# Patient Record
Sex: Female | Born: 1935 | Race: White | Hispanic: No | Marital: Married | Smoking: Never smoker
Health system: Southern US, Community
[De-identification: ages and names within clinical notes are randomized; demographics above are authoritative.]

## PROBLEM LIST (undated history)

## (undated) DIAGNOSIS — T4145XA Adverse effect of unspecified anesthetic, initial encounter: Secondary | ICD-10-CM

## (undated) DIAGNOSIS — M810 Age-related osteoporosis without current pathological fracture: Secondary | ICD-10-CM

## (undated) DIAGNOSIS — H409 Unspecified glaucoma: Secondary | ICD-10-CM

## (undated) DIAGNOSIS — M1990 Unspecified osteoarthritis, unspecified site: Secondary | ICD-10-CM

## (undated) DIAGNOSIS — R350 Frequency of micturition: Secondary | ICD-10-CM

## (undated) DIAGNOSIS — T8859XA Other complications of anesthesia, initial encounter: Secondary | ICD-10-CM

## (undated) DIAGNOSIS — Z950 Presence of cardiac pacemaker: Secondary | ICD-10-CM

## (undated) HISTORY — PX: EYE SURGERY: SHX253

## (undated) HISTORY — PX: WRIST FRACTURE SURGERY: SHX121

## (undated) HISTORY — PX: BACK SURGERY: SHX140

---

## 1979-01-03 HISTORY — PX: CHOLECYSTECTOMY: SHX55

## 2002-02-09 ENCOUNTER — Encounter: Payer: Self-pay | Admitting: Family Medicine

## 2002-02-09 ENCOUNTER — Ambulatory Visit (HOSPITAL_COMMUNITY): Admission: RE | Admit: 2002-02-09 | Discharge: 2002-02-09 | Payer: Self-pay | Admitting: Family Medicine

## 2003-08-03 ENCOUNTER — Ambulatory Visit (HOSPITAL_COMMUNITY): Admission: RE | Admit: 2003-08-03 | Discharge: 2003-08-03 | Payer: Self-pay | Admitting: Family Medicine

## 2004-01-18 ENCOUNTER — Ambulatory Visit (HOSPITAL_COMMUNITY): Admission: RE | Admit: 2004-01-18 | Discharge: 2004-01-18 | Payer: Self-pay | Admitting: Family Medicine

## 2004-03-11 ENCOUNTER — Ambulatory Visit (HOSPITAL_COMMUNITY): Admission: RE | Admit: 2004-03-11 | Discharge: 2004-03-11 | Payer: Self-pay | Admitting: Family Medicine

## 2004-07-15 ENCOUNTER — Ambulatory Visit (HOSPITAL_COMMUNITY): Admission: RE | Admit: 2004-07-15 | Discharge: 2004-07-15 | Payer: Self-pay | Admitting: Urology

## 2004-07-29 ENCOUNTER — Ambulatory Visit: Payer: Self-pay | Admitting: Internal Medicine

## 2004-07-29 ENCOUNTER — Ambulatory Visit (HOSPITAL_COMMUNITY): Admission: RE | Admit: 2004-07-29 | Discharge: 2004-07-29 | Payer: Self-pay | Admitting: Internal Medicine

## 2004-08-22 ENCOUNTER — Ambulatory Visit (HOSPITAL_COMMUNITY): Admission: RE | Admit: 2004-08-22 | Discharge: 2004-08-22 | Payer: Self-pay | Admitting: Urology

## 2005-07-13 ENCOUNTER — Ambulatory Visit (HOSPITAL_COMMUNITY): Admission: RE | Admit: 2005-07-13 | Discharge: 2005-07-13 | Payer: Self-pay | Admitting: Family Medicine

## 2006-02-12 ENCOUNTER — Ambulatory Visit (HOSPITAL_COMMUNITY): Admission: RE | Admit: 2006-02-12 | Discharge: 2006-02-12 | Payer: Self-pay | Admitting: Family Medicine

## 2009-03-14 ENCOUNTER — Ambulatory Visit: Payer: Self-pay | Admitting: Orthopedic Surgery

## 2009-03-14 DIAGNOSIS — M25559 Pain in unspecified hip: Secondary | ICD-10-CM | POA: Insufficient documentation

## 2009-03-14 DIAGNOSIS — M4800 Spinal stenosis, site unspecified: Secondary | ICD-10-CM | POA: Insufficient documentation

## 2009-03-14 DIAGNOSIS — M4120 Other idiopathic scoliosis, site unspecified: Secondary | ICD-10-CM | POA: Insufficient documentation

## 2009-03-15 ENCOUNTER — Telehealth: Payer: Self-pay | Admitting: Orthopedic Surgery

## 2009-03-19 ENCOUNTER — Ambulatory Visit (HOSPITAL_COMMUNITY): Admission: RE | Admit: 2009-03-19 | Discharge: 2009-03-19 | Payer: Self-pay | Admitting: Orthopedic Surgery

## 2009-03-19 ENCOUNTER — Encounter: Payer: Self-pay | Admitting: Orthopedic Surgery

## 2009-03-20 ENCOUNTER — Encounter: Payer: Self-pay | Admitting: Orthopedic Surgery

## 2009-05-08 ENCOUNTER — Telehealth: Payer: Self-pay | Admitting: Orthopedic Surgery

## 2009-05-23 ENCOUNTER — Telehealth: Payer: Self-pay | Admitting: Orthopedic Surgery

## 2009-06-17 ENCOUNTER — Encounter: Admission: RE | Admit: 2009-06-17 | Discharge: 2009-06-17 | Payer: Self-pay | Admitting: Neurology

## 2009-06-17 ENCOUNTER — Encounter: Payer: Self-pay | Admitting: Orthopedic Surgery

## 2009-08-28 ENCOUNTER — Encounter: Payer: Self-pay | Admitting: Orthopedic Surgery

## 2009-09-16 ENCOUNTER — Ambulatory Visit: Payer: Self-pay | Admitting: Orthopedic Surgery

## 2009-09-16 ENCOUNTER — Encounter (INDEPENDENT_AMBULATORY_CARE_PROVIDER_SITE_OTHER): Payer: Self-pay | Admitting: *Deleted

## 2009-09-16 DIAGNOSIS — M169 Osteoarthritis of hip, unspecified: Secondary | ICD-10-CM | POA: Insufficient documentation

## 2009-09-19 ENCOUNTER — Telehealth: Payer: Self-pay | Admitting: Orthopedic Surgery

## 2009-09-25 ENCOUNTER — Telehealth: Payer: Self-pay | Admitting: Orthopedic Surgery

## 2009-10-15 ENCOUNTER — Encounter: Payer: Self-pay | Admitting: Orthopedic Surgery

## 2009-10-28 ENCOUNTER — Telehealth: Payer: Self-pay | Admitting: Orthopedic Surgery

## 2009-11-19 ENCOUNTER — Ambulatory Visit (HOSPITAL_COMMUNITY): Admission: RE | Admit: 2009-11-19 | Discharge: 2009-11-19 | Payer: Self-pay | Admitting: Internal Medicine

## 2009-11-29 ENCOUNTER — Encounter (INDEPENDENT_AMBULATORY_CARE_PROVIDER_SITE_OTHER): Payer: Self-pay | Admitting: *Deleted

## 2009-11-29 DIAGNOSIS — R5383 Other fatigue: Secondary | ICD-10-CM

## 2009-11-29 DIAGNOSIS — R52 Pain, unspecified: Secondary | ICD-10-CM | POA: Insufficient documentation

## 2009-11-29 DIAGNOSIS — M6281 Muscle weakness (generalized): Secondary | ICD-10-CM | POA: Insufficient documentation

## 2009-11-29 DIAGNOSIS — R5381 Other malaise: Secondary | ICD-10-CM | POA: Insufficient documentation

## 2009-12-13 ENCOUNTER — Telehealth: Payer: Self-pay | Admitting: Orthopedic Surgery

## 2009-12-26 ENCOUNTER — Ambulatory Visit: Payer: Self-pay | Admitting: Infectious Diseases

## 2010-05-26 ENCOUNTER — Ambulatory Visit (HOSPITAL_COMMUNITY)
Admission: RE | Admit: 2010-05-26 | Discharge: 2010-05-26 | Payer: Self-pay | Source: Home / Self Care | Attending: Urology | Admitting: Urology

## 2010-06-03 NOTE — Letter (Signed)
Summary: *Referral Letter  [facility]  [address]  [place], [place] [address]   Phone: [phone]  Fax: [phone]    09/16/2009  Thank you in advance for agreeing to see my patient:  [name] [name] [address] [place], [place]  [address]  Phone: [phone]  Reason for Referral: spinal stenosis with positive grocery carts on  Procedures Requested: evaluation and treatment  Current Medical Problems: 1)  SCOLIOSIS, LUMBAR SPINE (ICD-737.30) 2)  SPINAL STENOSIS (ICD-724.00) 3)  HIP PAIN (ICD-719.45)   Past Medical History: 1)  Osteoporosis 2)  Glaucoma    Pertinent Labs: MRI November 2010, x-rays RIGHT hip May 2011   Thank you again for agreeing to see our patient; please contact us if you have any further questions or need additional information.  Sincerely,  Fuller Canada MD

## 2010-06-03 NOTE — Miscellaneous (Signed)
  called left message   Clinical Lists Changes

## 2010-06-03 NOTE — Miscellaneous (Signed)
Summary: Problems, Medications and Allergies updated  Clinical Lists Changes  Problems: Added new problem of FATIGUE (ICD-780.79) Added new problem of MUSCLE WEAKNESS (GENERALIZED) (ICD-728.87) Added new problem of GENERALIZED PAIN (ICD-780.96) Medications: Added new medication of CALCIUM-VITAMIN D 600-200 MG-UNIT TABS (CALCIUM-VITAMIN D) as directed per PCP Added new medication of * ACTONEL TABS (RISEDRONATE SODIUM) as directed per PCP Added new medication of VITAMIN D3 10000 UNIT CAPS (CHOLECALCIFEROL) Take 5 capsules by mouth one time a week per PCP Observations: Added new observation of ALLERGY REV: Done (11/29/2009 13:57)

## 2010-06-03 NOTE — Assessment & Plan Note (Signed)
Summary: RT HIP PAIN NEEDS XR/MEDICARE/[employer]/BSF   Visit Type:  Initial referral  Referring Provider:  Dr Margo Aye   CC:  right hip pain .  History of Present Illness: I saw [name] [name] in the office today for an initial visit.  She is a 77 years old woman with the complaint of:  HIP PAIN.  chief complaint: RIGHT HIP PAIN.  The patient is status post 2 back operations, one in 1966, and one in 1975. She's had episodic back pain over the years, but over the last 15 years or so. Her back pain has been very minimal. She was told to go home and take pain pills, which she did not do but over time. The back pain gradually went to light.  Over the last 2-3, months. She's had grade 4/10 pain, which is intermittent, improved with heat. She does have some pain, which radiates down into her RIGHT lower leg and also around the groin and lateral hip. She denies any injury. She is quite active still doing all of her housework and yard work.  x-rays were done in our office today, including hip and pelvis, and back.  Allergies (verified): 1)  ! Sulfa  Past History:  Past Medical History: Osteoporosis Glaucoma  Past Surgical History: Cataract surgery 2 Back surgeries Cholecystectomy  Review of Systems General:  Denies weight loss, weight gain, fever, chills, and fatigue. Cardiac :  Denies chest pain, angina, heart attack, heart failure, poor circulation, blood clots, and phlebitis. Resp:  Denies short of breath, difficulty breathing, COPD, cough, and pneumonia. GI:  Denies nausea, vomiting, diarrhea, constipation, difficulty swallowing, ulcers, GERD, and reflux. GU:  Denies kidney failure, kidney transplant, kidney stones, burning, poor stream, testicular cancer, blood in urine, and . Neuro:  Denies headache, dizziness, migraines, numbness, weakness, tremor, and unsteady walking. MS:  Complains of joint pain and osteoporosis; denies rheumatoid arthritis, joint swelling, gout, bone  cancer, and . Endo:  Denies thyroid disease, goiter, and diabetes. Psych:  Denies depression, mood swings, anxiety, panic attack, bipolar, and schizophrenia. Derm:  Denies eczema, cancer, and itching. EENT:  Complains of glaucoma; denies poor vision, cataracts, poor hearing, vertigo, ears ringing, sinusitis, hoarseness, toothaches, and bleeding gums. Immunology:  Denies seasonal allergies, sinus problems, and allergic to bee stings. Lymphatic:  Denies lymph node cancer and lymph edema.  Physical Exam  Additional Exam:  The patient is well developed and nourished, with normal grooming and hygiene. The body habitus is small  The pulses and perfusion were normal with normal color, temperature  and no swelling  Skin normal x 4 extremities  Lymph normal groin   Neuro: The coordination and sensation were normal In both legs The reflexes were normal RIGHT leg, abnormal LEFT knee previous nerve damage from back problem  Psychiatric alert and oriented x3. Mood and affect. Normal  Musculoskeletal Gait normal  Spine thoracic spine kyphosis. No tenderness. Lumbar spine. No tenderness. Thoracic and lumbar musculature. Normal. Muscle tone. No atrophy  Lower extremities. No tenderness over the greater trochanters, painful, internal rotation. The RIGHT hip. Normal LEFT hip normal flexion. Both hips, muscle tone normal. Both legs. Alignment, normal. Both legs. In the supine position. The leg lengths were equal although there is pelvic obliquity on the x-rays.   The upper extremities have normal appearance, ROM, strength and stability.      Impression & Recommendations:  Problem # 1:  SPINAL STENOSIS (ICD-724.00) Assessment New  Lumbar spine x-rays show degenerative scoliosis with disc space narrowing at  L3-L4, L4, 5, L5, S1, marginal osteophytes are seen. Coronal plane malalignment is quite severe. There is increased lordosis as well.  AP, pelvis and lateral, AP, RIGHT hip showed very  minimal degenerative changes with evidence of joint space maintained.  Assessment I think most of her pain is lumbar degenerated and advise her to get an MRI in preparation for epidural steroids if needed.  She probably has spinal stenosis and degenerative scoliosis with root impingement and foraminal stenosis.  Orders: New Patient Level IV ([address]) Lumbosacral Spine ,2/3 views (72100)  Problem # 2:  SCOLIOSIS, LUMBAR SPINE (ICD-737.30) Assessment: New  Orders: New Patient Level IV ([address]454) Lumbosacral Spine ,2/3 views (72100)  Problem # 3:  HIP PAIN (ICD-719.45) Assessment: New  Orders: New Patient Level IV ([address]11) Pelvis x-ray, 1/2 views (91478) Hip x-ray unilateral complete, minimum 2 views (29562)  Patient Instructions: 1)  MRI L spine [call results] 2)  tylenol, advil, or aleve are fine for pain

## 2010-06-03 NOTE — Assessment & Plan Note (Signed)
Summary: EVAL RT HIP/?NEW XRs?/REF J.LOVE/MEDICARE,LOY AM/CAF   Visit Type:  Follow-up Referring Provider:  [facility] Primary Provider:  Dr. Dwana Melena  CC:  right hip pain.  History of Present Illness: I saw [name] [name] in the office today for a followup visit.  She is a 77 years old woman with the complaint of:  right hip and back pain.  s/p Lumbar spine surgery x 2 last one was in the 1970s.  She had some difficulty in the 1990s was told she was inoperable she went home to take some medications but 3 days after a visit to the doctor at that time she woke up pain free and since that time has been very functional able patella garden plant flowers and do most anything she wanted to.  I saw her in the office for pain in her RIGHT hip in 2010 and saw no arthritis in the RIGHT hip a severely scoliotic degenerative disc and spine and knee lumbar region.  she did have an epidural at L5-S1 in February did not help.  Her MRI was done in November of 2010.  she has been worked up for line disease he was negative  The most interesting complaint she has is that when she goes in the grocery store and leans on the grocery cart she does fine when she's goes back to the car she started having problems again.  She complains of weakness when she walks.  I'm going to get another x-ray of her RIGHT hip just to make sure she doesn't have arthritis in the hip but she has a classic grocery cart sign or complaint for spinal stenosis  Allergies: 1)  ! Sulfa  Physical Exam  Additional Exam:  Normal Appearance, Oriented x 3, Mood normal  Cardiovascular exam reveals normal pulse perfusion to both legs and no swelling or edema.  there is no instability in either hip The leg lengths are equal.  She does have pain with flexion of the hip and pain with internal rotation in the RIGHT hip.  She has tenderness in the RIGHT buttock and RIGHT lower lumbar spine.  She is no pain in the  midline portion of the spine just to the RIGHT of the spine there is tenderness in the buttocks and lower back  The LEFT hip seems to be normal with normal flexion extension internal/external rotation.  Normal strength and LEFT leg skin is intact no tenderness over the greater trochanter.  There  She has  grade 0 reflexes in each leg normal muscle tone and grade 5 motor strength.  Skin is intact in the legs but no rash     Impression & Recommendations:  Problem # 1:  SPINAL STENOSIS (ICD-724.00) Assessment Deteriorated  Problem # 2:  SCOLIOSIS, LUMBAR SPINE (ICD-737.30) Assessment: Deteriorated  Problem # 3:  HIP PAIN (ICD-719.45) Assessment: Comment Only  AP and crosstable lateral RIGHT hip show slices as compared to previous film dated November 2000 and this shows that she has just a mild tender arthritis with a marginal osteophyte on the femoral head excellent joint space has been maintained impression is posterior arthritis mild RIGHT  I think it's okay to start her on debridement on 500 mg b.i.d. for her hip pain but she should see a spine specialist for spinal stenosis.  Her updated medication list for this problem includes:    Nabumetone 500 Mg Tabs (Nabumetone) .Marland Kitchen... 1 by mouth two times a day  Medications Added to Medication List  This Visit: 1)  Nabumetone 500 Mg Tabs (Nabumetone) .Marland Kitchen.. 1 by mouth two times a day  Other Orders: Est. Patient Level IV (84132) Hip x-ray unilateral complete, minimum 2 views (44010)  Patient Instructions: 1)  Injection right hip with steroids and lidocaine was not given today pt denied 2)  Spine referral for [facility] Nov 2010] 3)  f/u in 3 months  Prescriptions: NABUMETONE 500 MG TABS (NABUMETONE) 1 by mouth two times a day  #60 x 5   Entered and Authorized by:   Fuller Canada MD   Signed by:   Fuller Canada MD on 09/16/2009   Method used:   Faxed to ...       [facility] (retail)       [address]       [place], [place]  [address]       Ph: [phone]       Fax: [phone]   RxID:   [id number]   Appended Document: EVAL RT HIP/?NEW XRs?/REF J.LOVE/MEDICARE,LOY AM/CAF Allergies (verified): 1)  ! Sulfa  Past History:  Past Medical History: Osteoporosis Glaucoma  Past Surgical History: Cataract surgery 2 Back surgeries Cholecystectomy  Review of systems reveals that she has weakness in the legs she tires as she walks.  This is pretty consistent.  Again she has the decreased pain when she walks holding on to the grocery cart.  She does not have any bowel or bladder dysfunction at this time and no perineal sensory loss

## 2010-06-03 NOTE — Progress Notes (Signed)
Summary: Neurosurgeon referral.  Phone Note Outgoing Call   Call placed by: Waldon Reining,  Sep 19, 2009 3:44 PM Call placed to: Specialist Action Taken: Information Sent Summary of Call: I faxed a referral for this patient to [facility] to be seen for spinal stenosis of her l-spine.

## 2010-06-03 NOTE — Progress Notes (Signed)
Summary: Neurosurgeon appointment.  Phone Note Outgoing Call   Call placed by: Waldon Reining,  Sep 25, 2009 4:02 PM Call placed to: Patient Action Taken: Appt scheduled Summary of Call: I called to give the patient her appointment at [facility] with Dr. Newell Coral on 10-15-09 at 12:20. Patient is aware to take her MRI films.

## 2010-06-03 NOTE — Progress Notes (Signed)
Summary: Appointment with Dr. Sandria Manly.  Phone Note Outgoing Call   Call placed by: Waldon Reining,  May 23, 2009 4:21 PM Call placed to: Patient Action Taken: Phone Call Completed, Appt scheduled Summary of Call: I called to give the patient her appointment with Dr. Sandria Manly on 06-07-09 at 10:30.

## 2010-06-03 NOTE — Progress Notes (Signed)
Summary: call from patient asking for referral as discussed  Phone Note Call from Patient   Caller: Patient Summary of Call: Patient called with request:  - Asking about cancelling tomorrow's appointment for re-check hip/back.  States she spoke with Dr Romeo Apple and rec'd results of MRI; he mentioned referral to neurosurgeon.  Can she be referred w/out coming to this appointment?  - States she would like to see Dr. Sandria Manly * /  *Note: Dr Sandria Manly is a neurologist at [facility]. Their Ph is [phone]; Fax is [phone] Please advise. Initial call taken by: Cammie Sickle,  May 08, 2009 10:25 AM  Follow-up for Phone Call        proceed and cancel appointment Follow-up by: Fuller Canada MD,  May 08, 2009 10:28 AM  Additional Follow-up for Phone Call Additional follow up Details #1::        last note says I. will call with the results there is no appointment it should have been scheduled what is going on? Additional Follow-up by: Fuller Canada MD,  May 08, 2009 10:28 AM

## 2010-06-03 NOTE — Consult Note (Signed)
Summary: [facility] Consult Dr Leanne Lovely Consult Dr Newell Coral   Imported By: Cammie Sickle 10/26/2009 12:43:40  _____________________________________________________________________  External Attachment:    Type:   Image     Comment:   External Document

## 2010-06-03 NOTE — Progress Notes (Signed)
Summary: pt to call back to r/s appointment  Phone Note Call from Patient   Caller: Patient Summary of Call: Patient called to cancel her 55month fol/up appt; states other doctor appointments at this time. States will call to re-schedule. Initial call taken by: Cammie Sickle,  December 13, 2009 2:15 PM

## 2010-06-03 NOTE — Progress Notes (Signed)
Summary: Referral to Dr. Sandria Manly.  Phone Note Outgoing Call   Call placed by: Waldon Reining,  May 08, 2009 1:21 PM Call placed to: Specialist Action Taken: Information Sent Summary of Call: I faxed a referral for this patient to Neurologist, Dr. Sandria Manly. Patient wanted to be seen by this doctor.

## 2010-06-03 NOTE — Letter (Signed)
Summary: History form  History form   Imported By: Jacklynn Ganong 03/18/2009 09:11:47  _____________________________________________________________________  External Attachment:    Type:   Image     Comment:   External Document

## 2010-06-03 NOTE — Progress Notes (Signed)
Summary: call from patient fol'g neurosurgeon consult  Phone Note Call from Patient   Caller: Patient Summary of Call: Patient called back following the neurosurgeon consult w/Dr Newell Coral.  Asking about a referral to an infectious disease specialist, due to her suspecting that her problem may be related to a tick bite.  I advised patient that her primary care physician, Dr Margo Aye, would most likely need to make the referral as Dr Margo Aye treated for this problem and would have the pertinent office notes. Initial call taken by: Cammie Sickle,  October 28, 2009 3:03 PM

## 2010-06-03 NOTE — Consult Note (Signed)
Summary: Consult notes from Dr. Sandria Manly  Consult notes from Dr. Sandria Manly   Imported By: Jacklynn Ganong 08/30/2009 09:49:44  _____________________________________________________________________  External Attachment:    Type:   Image     Comment:   External Document  Appended Document: Consult notes from Dr. Sandria Manly hip already evaluated   not a cause for leg symptoms

## 2010-06-03 NOTE — Miscellaneous (Signed)
Summary: [facility] referral  Clinical Lists Changes  Orders: Added new Referral order of Neurosurgeon Referral (Neurosurgeon) - Signed pt had surgery a long time ago, does not remember the drs name

## 2010-06-03 NOTE — Miscellaneous (Signed)
Summary: HIPAA Restrictions  HIPAA Restrictions   Imported By: Florinda Marker 12/26/2009 14:59:20  _____________________________________________________________________  External Attachment:    Type:   Image     Comment:   External Document

## 2010-07-22 ENCOUNTER — Encounter: Payer: Self-pay | Admitting: *Deleted

## 2010-09-19 NOTE — H&P (Signed)
NAME:  [name], [name]               ACCOUNT NO.:  [id number]   MEDICAL RECORD NO.:  [id number]          PATIENT TYPE:  AMB   LOCATION:  DAY                           FACILITY:  [facility]   PHYSICIAN:  Ky Barban, M.D.DATE OF BIRTH:  1923-08-09   DATE OF ADMISSION:  DATE OF DISCHARGE:  LH                                HISTORY & PHYSICAL   CHIEF COMPLAINT:  Urinary frequency.   HISTORY OF PRESENT ILLNESS:  The patient was seen in the office on March 13  after she was referred by Dr. __________ having urinary frequency, urgency.  No incontinence and during the work-up she was found to have a periureteral  cyst that does not look like diverticulum.  So I have advised her excision  of the cyst so she is coming as an outpatient.  I have discussed the  procedure, its limitations, complications to the patient and she understands  and wants to go ahead and proceed.   PAST MEDICAL HISTORY:  History of having ureteral stenosis and had urethral  dilations done in the past.  No other significant medical problem.   ALLERGIES:  None.   MEDICATIONS:  None.   REVIEW OF SYSTEMS:  Unremarkable.   PHYSICAL EXAMINATION:  GENERAL:  Well-developed female not in any acute  distress, fully conscious, alert, oriented.  VITAL SIGNS: Blood pressure 140/80, temperature normal.  CENTRAL NERVOUS SYSTEM:  Negative.  HEAD AND NECK:  Negative.  CHEST:  Symmetrical.  HEART:  Regular sinus rhythm.  ABDOMEN:  Soft, flat.  Liver, spleen, and kidneys not palpable.  No CVA  tenderness.  PELVIC:  No adnexal masses or tenderness.  There is a 1 cm cyst just below  the distal third of the urethra.  On  pressing, I do not see any fluid coming out of the meatus so it looks like  it may be just a Bartholin's cyst, not a diverting complaint.   PLAN:  Excision of cyst under anesthesia as an outpatient.      MIJ/MEDQ  D:  08/21/2004  T:  08/21/2004  Job:  [id number]

## 2010-09-19 NOTE — Procedures (Signed)
NAME:  [name] [name], [name] [name]                         ACCOUNT NO.:  [id number]   MEDICAL RECORD NO.:  [id number]                   PATIENT TYPE:  OUT   LOCATION:  RAD                                  FACILITY:  [facility]   PHYSICIAN:  Dani Gobble, MD                    DATE OF BIRTH:  December 10, 1923   DATE OF PROCEDURE:  DATE OF DISCHARGE:                                  ECHOCARDIOGRAM   INDICATION:  [name] is [name] 77 year old female with cardiomegaly on chest x-  ray who has been experiencing chest discomfort who has no prior cardiac  history.   1. The aorta is within normal limits at 2.7 cm.  2. The left atrium is mildly dilated at 4.1 cm.  No obvious clots or masses     were appreciated, and the patient appeared to be in sinus rhythm during     this procedure.  3. The intraventricular septum and posterior wall are mild to moderately     thickened at 1.6 cm and 1.3 cm respectively.  The aortic valve is thin,     trileaflet, and pliable with normal leaflet excursion.  Trace to mild     aortic insufficiency is appreciated.  Doppler interrogation of the aortic     valve is within normal limits.  4. The mitral valve also appears structurally normal.  No mitral valve     prolapse is noted.  Mild mitral regurgitation is noted.  Doppler     interrogation of the mitral valve is within normal limits.  5. The pulmonic valve is incompletely visualized, but appears grossly     structurally normal.  6. The tricuspid valve also appears grossly structurally normal with mild     tricuspid regurgitation noted.  RSV right ventricular systolic pressure     is minimally elevated at 33 mmHg.  7. The left ventricle is normal in size with the LVIDD measured at 3.8 cm,     and the LVISD measured at 2.8 cm.  8. Overall left ventricular systolic function is normal, and no regional     wall motion abnormalities are appreciated.  There is no evidence for     diastolic dysfunction.  9. The right atrium is mildly dilated,  while the right ventricle is normal     in size with normal right ventricular systolic function.   IMPRESSION:  1. Mild biatrial enlargement.  2. Mild-to-moderate concentric left ventricular hypertrophy.  3. Mild mitral and tricuspid regurgitation.  4. Normal left ventricular chamber size with normal left ventricular     systolic function and no regional wall motion abnormalities noted.  5. Trace to mild aortic insufficiency.      ___________________________________________  Dani Gobble, MD   AB/MEDQ  D:  08/03/2003  T:  08/03/2003  Job:  [id number]   cc:   Patrica Duel, M.D.  [name]  [place]  [place] [address]  Fax: [phone]

## 2010-09-19 NOTE — Op Note (Signed)
NAME:  [name], [name]               ACCOUNT NO.:  [id number]   MEDICAL RECORD NO.:  [id number]          PATIENT TYPE:  AMB   LOCATION:  DAY                           FACILITY:  [facility]   PHYSICIAN:  Lionel December, M.D.    DATE OF BIRTH:  1923-07-09   DATE OF PROCEDURE:  07/29/2004  DATE OF DISCHARGE:                                 OPERATIVE REPORT   PROCEDURE:  Colonoscopy, which was incomplete.   INDICATIONS:  [name] [name] is a 77 year old Caucasian female who is in good  health, who is here for screening colonoscopy.  Family history is negative  for colorectal carcinoma.  The procedure risks were reviewed with the  patient, informed consent was obtained.   PREMEDICATION:  Demerol 25 mg IV, Versed 3 mg IV in divided dose.   FINDINGS:  Procedure performed in endoscopy suite.  The patient's vital  signs and O2 saturation were monitored during procedure and remained stable.  The patient was placed left in lateral position.  Rectal examination  performed.  No abnormality noted external or digital exam.  The Olympus  video scope was placed in the rectum and advanced under vision in sigmoid  colon.  She had multiple diverticula at sigmoid colon, which was tortuous.  The scope was passed to the midsigmoid colon.  A sharp turn.  Could not see the lumen.  She had multiple diverticula in this area.  Therefore, did not advance the scope blindly.  The patient was turned in  supine position, as well as on the right side but without any success.  The  endoscope was withdrawn.  As the scope was withdrawn, mucosa of the distal  sigmoid colon once again carefully examined.  There were no polyps.  Rectal  mucosa similarly was normal.  Scope was retroflexed to examine anorectal junction, which was unremarkable.  Endoscope was straightened and withdrawn.  The patient tolerated the  procedure well.   FINAL DIAGNOSIS:  Incomplete exam limited to flexible sigmoidoscopy.  She  has extensive sigmoid colon  diverticulosis with a tortuous sigmoid colon  with inability to find the lumen.   RECOMMENDATIONS:  She will have a barium enema later today to examine the  rest of the colon.      NR/MEDQ  D:  07/29/2004  T:  07/29/2004  Job:  [id number]   cc:   Patrica Duel, M.D.  [address]  [place]  [place] [address]  Fax: [phone]

## 2010-09-19 NOTE — Op Note (Signed)
NAME:  [name] [name], [name] [name]               ACCOUNT NO.:  [id number]   MEDICAL RECORD NO.:  [id number]          PATIENT TYPE:  AMB   LOCATION:  DAY                           FACILITY:  [facility]   PHYSICIAN:  Ky Barban, M.D.DATE OF BIRTH:  1924/02/01   DATE OF PROCEDURE:  08/22/2004  DATE OF DISCHARGE:                                 OPERATIVE REPORT   PREOPERATIVE DIAGNOSIS:  Periurethral cyst.   POSTOPERATIVE DIAGNOSIS:  Periurethral cyst.   PROCEDURE:  Excision of periurethral cyst along with redundant vaginal  mucosa.   ANESTHESIA:  General.   PROCEDURE:  The patient was given general anesthesia, placed in lithotomy  position after the usual prep and drape, a #20 Foley catheter inserted into  the bladder.  The cyst is located below the distal part of the urethra to  the right.  An incision right over it is made and there is a lot of  redundant vaginal mucosa, which was also enclosed in this incision.  The  dissection is carried out, the cyst is exposed, and it was ruptured.  The  wall of the cyst was collected and the redundant vaginal mucosa was simply  excised.  Then periurethral tissue was closed with interrupted sutures of 3-  0 Vicryl.  The cyst was not attached to the urethra.  After closing after  approximating the periurethral tissue, the vaginal mucosa was closed with  interrupted sutures of 3-0 Vicryl.  A nice, smooth vaginal wall is obtained,  and there is no bleeding, and at this point the Foley catheter is removed.  The patient left the operating room in satisfactory condition.      MIJ/MEDQ  D:  08/22/2004  T:  08/22/2004  Job:  [id number]

## 2011-05-26 DIAGNOSIS — H4011X0 Primary open-angle glaucoma, stage unspecified: Secondary | ICD-10-CM | POA: Diagnosis not present

## 2011-05-26 DIAGNOSIS — H409 Unspecified glaucoma: Secondary | ICD-10-CM | POA: Diagnosis not present

## 2011-06-04 DIAGNOSIS — N10 Acute tubulo-interstitial nephritis: Secondary | ICD-10-CM | POA: Diagnosis not present

## 2011-06-04 DIAGNOSIS — R350 Frequency of micturition: Secondary | ICD-10-CM | POA: Diagnosis not present

## 2011-09-23 DIAGNOSIS — H4011X0 Primary open-angle glaucoma, stage unspecified: Secondary | ICD-10-CM | POA: Diagnosis not present

## 2011-09-23 DIAGNOSIS — H409 Unspecified glaucoma: Secondary | ICD-10-CM | POA: Diagnosis not present

## 2011-09-23 DIAGNOSIS — H26499 Other secondary cataract, unspecified eye: Secondary | ICD-10-CM | POA: Diagnosis not present

## 2011-10-20 DIAGNOSIS — H26499 Other secondary cataract, unspecified eye: Secondary | ICD-10-CM | POA: Diagnosis not present

## 2011-10-20 DIAGNOSIS — H4011X0 Primary open-angle glaucoma, stage unspecified: Secondary | ICD-10-CM | POA: Diagnosis not present

## 2011-10-20 DIAGNOSIS — H409 Unspecified glaucoma: Secondary | ICD-10-CM | POA: Diagnosis not present

## 2011-10-28 DIAGNOSIS — H409 Unspecified glaucoma: Secondary | ICD-10-CM | POA: Diagnosis not present

## 2011-10-28 DIAGNOSIS — H4011X0 Primary open-angle glaucoma, stage unspecified: Secondary | ICD-10-CM | POA: Diagnosis not present

## 2011-10-28 DIAGNOSIS — H26499 Other secondary cataract, unspecified eye: Secondary | ICD-10-CM | POA: Diagnosis not present

## 2011-12-29 DIAGNOSIS — H409 Unspecified glaucoma: Secondary | ICD-10-CM | POA: Diagnosis not present

## 2011-12-29 DIAGNOSIS — H4011X0 Primary open-angle glaucoma, stage unspecified: Secondary | ICD-10-CM | POA: Diagnosis not present

## 2012-02-01 DIAGNOSIS — Z23 Encounter for immunization: Secondary | ICD-10-CM | POA: Diagnosis not present

## 2012-04-11 DIAGNOSIS — R350 Frequency of micturition: Secondary | ICD-10-CM | POA: Diagnosis not present

## 2012-05-10 DIAGNOSIS — H409 Unspecified glaucoma: Secondary | ICD-10-CM | POA: Diagnosis not present

## 2012-05-10 DIAGNOSIS — H4011X0 Primary open-angle glaucoma, stage unspecified: Secondary | ICD-10-CM | POA: Diagnosis not present

## 2012-06-03 DIAGNOSIS — R350 Frequency of micturition: Secondary | ICD-10-CM | POA: Diagnosis not present

## 2012-06-14 DIAGNOSIS — R300 Dysuria: Secondary | ICD-10-CM | POA: Diagnosis not present

## 2012-06-14 DIAGNOSIS — N3946 Mixed incontinence: Secondary | ICD-10-CM | POA: Diagnosis not present

## 2012-06-21 DIAGNOSIS — H409 Unspecified glaucoma: Secondary | ICD-10-CM | POA: Diagnosis not present

## 2012-06-21 DIAGNOSIS — H4011X0 Primary open-angle glaucoma, stage unspecified: Secondary | ICD-10-CM | POA: Diagnosis not present

## 2012-06-28 ENCOUNTER — Ambulatory Visit (INDEPENDENT_AMBULATORY_CARE_PROVIDER_SITE_OTHER): Payer: Medicare Other | Admitting: Urology

## 2012-06-28 DIAGNOSIS — N952 Postmenopausal atrophic vaginitis: Secondary | ICD-10-CM

## 2012-06-28 DIAGNOSIS — R350 Frequency of micturition: Secondary | ICD-10-CM | POA: Diagnosis not present

## 2012-06-28 DIAGNOSIS — N9489 Other specified conditions associated with female genital organs and menstrual cycle: Secondary | ICD-10-CM

## 2012-09-07 DIAGNOSIS — H4011X0 Primary open-angle glaucoma, stage unspecified: Secondary | ICD-10-CM | POA: Diagnosis not present

## 2012-09-07 DIAGNOSIS — H409 Unspecified glaucoma: Secondary | ICD-10-CM | POA: Diagnosis not present

## 2013-01-10 DIAGNOSIS — H1030 Unspecified acute conjunctivitis, unspecified eye: Secondary | ICD-10-CM | POA: Diagnosis not present

## 2013-01-18 DIAGNOSIS — H409 Unspecified glaucoma: Secondary | ICD-10-CM | POA: Diagnosis not present

## 2013-01-18 DIAGNOSIS — H4011X0 Primary open-angle glaucoma, stage unspecified: Secondary | ICD-10-CM | POA: Diagnosis not present

## 2013-01-24 ENCOUNTER — Ambulatory Visit (INDEPENDENT_AMBULATORY_CARE_PROVIDER_SITE_OTHER): Payer: Medicare Other | Admitting: Urology

## 2013-01-24 DIAGNOSIS — R350 Frequency of micturition: Secondary | ICD-10-CM | POA: Diagnosis not present

## 2013-01-24 DIAGNOSIS — R300 Dysuria: Secondary | ICD-10-CM

## 2013-01-24 DIAGNOSIS — N952 Postmenopausal atrophic vaginitis: Secondary | ICD-10-CM | POA: Diagnosis not present

## 2013-02-15 DIAGNOSIS — H4011X0 Primary open-angle glaucoma, stage unspecified: Secondary | ICD-10-CM | POA: Diagnosis not present

## 2013-02-15 DIAGNOSIS — H409 Unspecified glaucoma: Secondary | ICD-10-CM | POA: Diagnosis not present

## 2013-02-20 DIAGNOSIS — H4011X0 Primary open-angle glaucoma, stage unspecified: Secondary | ICD-10-CM | POA: Diagnosis not present

## 2013-02-20 DIAGNOSIS — H10019 Acute follicular conjunctivitis, unspecified eye: Secondary | ICD-10-CM | POA: Diagnosis not present

## 2013-02-21 ENCOUNTER — Ambulatory Visit: Payer: Medicare Other | Admitting: Urology

## 2013-03-02 DIAGNOSIS — Z23 Encounter for immunization: Secondary | ICD-10-CM | POA: Diagnosis not present

## 2013-03-07 ENCOUNTER — Encounter (INDEPENDENT_AMBULATORY_CARE_PROVIDER_SITE_OTHER): Payer: Self-pay

## 2013-03-07 ENCOUNTER — Ambulatory Visit (INDEPENDENT_AMBULATORY_CARE_PROVIDER_SITE_OTHER): Payer: Medicare Other | Admitting: Urology

## 2013-03-07 DIAGNOSIS — R350 Frequency of micturition: Secondary | ICD-10-CM | POA: Diagnosis not present

## 2013-03-07 DIAGNOSIS — H4011X0 Primary open-angle glaucoma, stage unspecified: Secondary | ICD-10-CM | POA: Diagnosis not present

## 2013-03-07 DIAGNOSIS — N3946 Mixed incontinence: Secondary | ICD-10-CM | POA: Diagnosis not present

## 2013-03-07 DIAGNOSIS — H409 Unspecified glaucoma: Secondary | ICD-10-CM | POA: Diagnosis not present

## 2013-03-07 DIAGNOSIS — H10019 Acute follicular conjunctivitis, unspecified eye: Secondary | ICD-10-CM | POA: Diagnosis not present

## 2013-03-16 ENCOUNTER — Other Ambulatory Visit (HOSPITAL_COMMUNITY): Payer: Self-pay | Admitting: Internal Medicine

## 2013-03-16 ENCOUNTER — Ambulatory Visit (HOSPITAL_COMMUNITY)
Admission: RE | Admit: 2013-03-16 | Discharge: 2013-03-16 | Disposition: A | Discharge status: Home / Self Care | Payer: Medicare Other | Source: Ambulatory Visit | Attending: Internal Medicine | Admitting: Internal Medicine

## 2013-03-16 DIAGNOSIS — M25519 Pain in unspecified shoulder: Secondary | ICD-10-CM | POA: Insufficient documentation

## 2013-03-16 DIAGNOSIS — S43499A Other sprain of unspecified shoulder joint, initial encounter: Secondary | ICD-10-CM | POA: Diagnosis not present

## 2013-03-16 DIAGNOSIS — M25512 Pain in left shoulder: Secondary | ICD-10-CM

## 2013-03-16 DIAGNOSIS — M25551 Pain in right hip: Secondary | ICD-10-CM

## 2013-03-16 DIAGNOSIS — M19019 Primary osteoarthritis, unspecified shoulder: Secondary | ICD-10-CM | POA: Diagnosis not present

## 2013-03-16 DIAGNOSIS — M25559 Pain in unspecified hip: Secondary | ICD-10-CM | POA: Diagnosis not present

## 2013-03-16 DIAGNOSIS — M169 Osteoarthritis of hip, unspecified: Secondary | ICD-10-CM | POA: Diagnosis not present

## 2013-03-21 DIAGNOSIS — H409 Unspecified glaucoma: Secondary | ICD-10-CM | POA: Diagnosis not present

## 2013-03-21 DIAGNOSIS — H4011X0 Primary open-angle glaucoma, stage unspecified: Secondary | ICD-10-CM | POA: Diagnosis not present

## 2013-04-06 DIAGNOSIS — M25559 Pain in unspecified hip: Secondary | ICD-10-CM | POA: Diagnosis not present

## 2013-04-06 DIAGNOSIS — M1990 Unspecified osteoarthritis, unspecified site: Secondary | ICD-10-CM | POA: Diagnosis not present

## 2013-04-20 DIAGNOSIS — M2550 Pain in unspecified joint: Secondary | ICD-10-CM | POA: Diagnosis not present

## 2013-06-06 DIAGNOSIS — M1610 Unilateral primary osteoarthritis, unspecified hip: Secondary | ICD-10-CM | POA: Diagnosis not present

## 2013-06-06 DIAGNOSIS — M169 Osteoarthritis of hip, unspecified: Secondary | ICD-10-CM | POA: Diagnosis not present

## 2013-06-06 DIAGNOSIS — H409 Unspecified glaucoma: Secondary | ICD-10-CM | POA: Diagnosis not present

## 2013-06-06 DIAGNOSIS — H4011X0 Primary open-angle glaucoma, stage unspecified: Secondary | ICD-10-CM | POA: Diagnosis not present

## 2013-06-21 DIAGNOSIS — M1610 Unilateral primary osteoarthritis, unspecified hip: Secondary | ICD-10-CM | POA: Diagnosis not present

## 2013-06-21 DIAGNOSIS — M169 Osteoarthritis of hip, unspecified: Secondary | ICD-10-CM | POA: Diagnosis not present

## 2013-06-27 ENCOUNTER — Ambulatory Visit: Payer: Medicare Other | Admitting: Urology

## 2013-07-19 ENCOUNTER — Encounter (HOSPITAL_COMMUNITY): Payer: Self-pay | Admitting: Pharmacy Technician

## 2013-07-24 ENCOUNTER — Encounter (HOSPITAL_COMMUNITY): Payer: Self-pay

## 2013-07-24 ENCOUNTER — Encounter (HOSPITAL_COMMUNITY)
Admission: RE | Admit: 2013-07-24 | Discharge: 2013-07-24 | Disposition: A | Discharge status: Home / Self Care | Payer: Medicare Other | Source: Ambulatory Visit | Attending: Orthopedic Surgery | Admitting: Orthopedic Surgery

## 2013-07-24 ENCOUNTER — Encounter (INDEPENDENT_AMBULATORY_CARE_PROVIDER_SITE_OTHER): Payer: Self-pay

## 2013-07-24 DIAGNOSIS — Z01812 Encounter for preprocedural laboratory examination: Secondary | ICD-10-CM | POA: Insufficient documentation

## 2013-07-24 HISTORY — DX: Unspecified glaucoma: H40.9

## 2013-07-24 HISTORY — DX: Unspecified osteoarthritis, unspecified site: M19.90

## 2013-07-24 HISTORY — DX: Frequency of micturition: R35.0

## 2013-07-24 HISTORY — DX: Age-related osteoporosis without current pathological fracture: M81.0

## 2013-07-24 LAB — URINALYSIS, ROUTINE W REFLEX MICROSCOPIC
Bilirubin Urine: NEGATIVE
Glucose, UA: NEGATIVE mg/dL
Hgb urine dipstick: NEGATIVE
Ketones, ur: NEGATIVE mg/dL
Leukocytes, UA: NEGATIVE
Nitrite: NEGATIVE
Protein, ur: NEGATIVE mg/dL
Specific Gravity, Urine: 1.012 (ref 1.005–1.030)
Urobilinogen, UA: 0.2 mg/dL (ref 0.0–1.0)
pH: 5.5 (ref 5.0–8.0)

## 2013-07-24 LAB — CBC
HCT: 46.7 % — ABNORMAL HIGH (ref 36.0–46.0)
Hemoglobin: 15.6 g/dL — ABNORMAL HIGH (ref 12.0–15.0)
MCH: 30.8 pg (ref 26.0–34.0)
MCHC: 33.4 g/dL (ref 30.0–36.0)
MCV: 92.1 fL (ref 78.0–100.0)
Platelets: 174 10*3/uL (ref 150–400)
RBC: 5.07 MIL/uL (ref 3.87–5.11)
RDW: 12.6 % (ref 11.5–15.5)
WBC: 7.8 10*3/uL (ref 4.0–10.5)

## 2013-07-24 LAB — BASIC METABOLIC PANEL
BUN: 15 mg/dL (ref 6–23)
CO2: 30 mEq/L (ref 19–32)
Calcium: 10 mg/dL (ref 8.4–10.5)
Chloride: 100 mEq/L (ref 96–112)
Creatinine, Ser: 0.78 mg/dL (ref 0.50–1.10)
GFR calc Af Amer: 85 mL/min — ABNORMAL LOW (ref >90)
GFR calc non Af Amer: 73 mL/min — ABNORMAL LOW (ref >90)
Glucose, Bld: 100 mg/dL — ABNORMAL HIGH (ref 70–99)
Potassium: 6 mEq/L — ABNORMAL HIGH (ref 3.7–5.3)
Sodium: 139 mEq/L (ref 137–147)

## 2013-07-24 LAB — APTT: aPTT: 25 seconds (ref 24–37)

## 2013-07-24 LAB — PROTIME-INR
INR: 0.9 (ref 0.00–1.49)
Prothrombin Time: 12 seconds (ref 11.6–15.2)

## 2013-07-24 LAB — SURGICAL PCR SCREEN
MRSA, PCR: NEGATIVE
Staphylococcus aureus: NEGATIVE

## 2013-07-24 LAB — ABO/RH: ABO/RH(D): A NEG

## 2013-07-24 NOTE — Patient Instructions (Addendum)
[name] [name]  07/24/2013                           YOUR PROCEDURE IS SCHEDULED ON: 08/01/13               PLEASE REPORT TO SHORT STAY CENTER AT : 9:00 AM               CALL THIS NUMBER IF ANY PROBLEMS THE DAY OF SURGERY :               [phone]                                REMEMBER:   Do not eat food or drink liquids AFTER MIDNIGHT   May have clear liquids UNTIL 6 HOURS BEFORE SURGERY (6:00 AM)               Take these medicines the morning of surgery with A SIP OF WATER: NONE   Do not wear jewelry, make-up   Do not wear lotions, powders, or perfumes.   Do not shave legs or underarms 12 hrs. before surgery (men may shave face)  Do not bring valuables to the hospital.  Contacts, dentures or bridgework may not be worn into surgery.  Leave suitcase in the car. After surgery it may be brought to your room.  For patients admitted to the hospital more than one night, checkout time is            11:00 AM                                                       The day of discharge.   Patients discharged the day of surgery will not be allowed to drive home.            If going home same day of surgery, must have someone stay with you              FIRST 24 hrs at home and arrange for some one to drive you              home from hospital.    Special Instructions             Please read over the following fact sheets that you were given:               1. [place] PREPARING FOR SURGERY SHEET               2. INCENTIVE SPIROMETER               3. MRSA INFORMATION SHEET                                                X_____________________________________________________________________        Failure to follow these instructions may result in cancellation of your surgery

## 2013-07-24 NOTE — Progress Notes (Signed)
Abnormal BMET faxed to Dr. Olin 

## 2013-07-26 NOTE — Progress Notes (Signed)
Pt notified of time change to 10:15 am - instructed to arrive at 7:15 am

## 2013-07-30 NOTE — H&P (Signed)
TOTAL HIP ADMISSION H&P  Patient is admitted for right total hip arthroplasty, anterior approach.  Subjective:  Chief Complaint:      Right hip OA / pain  HPI: [name] [name], 80 y.o. female, has a history of pain and functional disability in the right hip(s) due to arthritis and patient has failed non-surgical conservative treatments for greater than 12 weeks to include NSAID's and/or analgesics, corticosteriod injections and activity modification.  Onset of symptoms was gradual starting 4-5 years ago with gradually worsening course since that time.The patient noted no past surgery on the right hip(s).  Patient currently rates pain in the right hip at 10 out of 10 with activity. Patient has night pain, worsening of pain with activity and weight bearing, trendelenberg gait, pain that interfers with activities of daily living and pain with passive range of motion. Patient has evidence of periarticular osteophytes and joint space narrowing by imaging studies. This condition presents safety issues increasing the risk of falls. There is no current active infection.  Risks, benefits and expectations were discussed with the patient.  Risks including but not limited to the risk of anesthesia, blood clots, nerve damage, blood vessel damage, failure of the prosthesis, infection and up to and including death.  Patient understand the risks, benefits and expectations and wishes to proceed with surgery.   D/C Plans:   Home with HHPT  Post-op Meds:   No Rx given  Tranexamic Acid:   To be given  Decadron:    To be given  FYI:    ASA post-op  Norco post-op   Patient Active Problem List   Diagnosis Date Noted  . MUSCLE WEAKNESS (GENERALIZED) 11/29/2009  . FATIGUE 11/29/2009  . GENERALIZED PAIN 11/29/2009  . OSTEOARTHRITIS, HIP, RIGHT 09/16/2009  . HIP PAIN 03/14/2009  . SPINAL STENOSIS 03/14/2009  . SCOLIOSIS, LUMBAR SPINE 03/14/2009   Past Medical History  Diagnosis Date  . Arthritis   .  Osteoporosis   . Frequency of urination   . Glaucoma     Past Surgical History  Procedure Laterality Date  . Cholecystectomy  1980'S  . Back surgery  1967 / 1970'S    X 2  . Wrist fracture surgery      No prescriptions prior to admission   Allergies  Allergen Reactions  . Sulfonamide Derivatives Itching and Swelling    History  Substance Use Topics  . Smoking status: Never Smoker   . Smokeless tobacco: Not on file  . Alcohol Use: Yes     Comment: OCCASIONAL WINE    No family history on file.   Review of Systems  Constitutional: Positive for malaise/fatigue.  HENT: Negative.   Eyes: Negative.   Respiratory: Negative.   Cardiovascular: Negative.   Gastrointestinal: Negative.   Genitourinary: Positive for frequency.  Musculoskeletal: Positive for back pain and joint pain.  Skin: Negative.   Neurological: Negative.   Endo/Heme/Allergies: Negative.   Psychiatric/Behavioral: Negative.     Objective:  Physical Exam  Constitutional: She is oriented to person, place, and time. She appears well-developed and well-nourished.  HENT:  Head: Normocephalic and atraumatic.  Mouth/Throat: Oropharynx is clear and moist.  Eyes: Pupils are equal, round, and reactive to light.  Neck: Neck supple. No JVD present. No tracheal deviation present. No thyromegaly present.  Cardiovascular: Normal rate, regular rhythm, normal heart sounds and intact distal pulses.   Respiratory: Effort normal and breath sounds normal. No stridor. No respiratory distress. She has no wheezes.  GI: Soft. There is  no tenderness. There is no guarding.  Musculoskeletal:       Right hip: She exhibits decreased range of motion, decreased strength, tenderness and bony tenderness. She exhibits no swelling, no deformity and no laceration.  Lymphadenopathy:    She has no cervical adenopathy.  Neurological: She is alert and oriented to person, place, and time.  Skin: Skin is warm and dry.  Psychiatric: She has a  normal mood and affect.     Imaging Review Plain radiographs demonstrate severe degenerative joint disease of the right hip(s). The bone quality appears to be good for age and reported activity level.  Assessment/Plan:  End stage arthritis, right hip(s)  The patient history, physical examination, clinical judgement of the provider and imaging studies are consistent with end stage degenerative joint disease of the right hip(s) and total hip arthroplasty is deemed medically necessary. The treatment options including medical management, injection therapy, arthroscopy and arthroplasty were discussed at length. The risks and benefits of total hip arthroplasty were presented and reviewed. The risks due to aseptic loosening, infection, stiffness, dislocation/subluxation,  thromboembolic complications and other imponderables were discussed.  The patient acknowledged the explanation, agreed to proceed with the plan and consent was signed. Patient is being admitted for inpatient treatment for surgery, pain control, PT, OT, prophylactic antibiotics, VTE prophylaxis, progressive ambulation and ADL's and discharge planning.The patient is planning to be discharged home with home health services .     Anastasio AuerbachMatthew S. Roark Rufo   PAC  07/30/2013, 8:48 PM

## 2013-08-01 ENCOUNTER — Encounter (HOSPITAL_COMMUNITY)
Admission: RE | Disposition: A | Discharge status: Home Health | Payer: Self-pay | Source: Ambulatory Visit | Attending: Orthopedic Surgery

## 2013-08-01 ENCOUNTER — Inpatient Hospital Stay (HOSPITAL_COMMUNITY): Payer: Medicare Other

## 2013-08-01 ENCOUNTER — Encounter (HOSPITAL_COMMUNITY): Payer: Self-pay | Admitting: *Deleted

## 2013-08-01 ENCOUNTER — Inpatient Hospital Stay (HOSPITAL_COMMUNITY)
Admission: RE | Admit: 2013-08-01 | Discharge: 2013-08-03 | DRG: 470 | Disposition: A | Discharge status: Home Health | Payer: Medicare Other | Source: Ambulatory Visit | Attending: Orthopedic Surgery | Admitting: Orthopedic Surgery

## 2013-08-01 ENCOUNTER — Encounter (HOSPITAL_COMMUNITY): Payer: Medicare Other | Admitting: Anesthesiology

## 2013-08-01 ENCOUNTER — Inpatient Hospital Stay (HOSPITAL_COMMUNITY): Payer: Medicare Other | Admitting: Anesthesiology

## 2013-08-01 DIAGNOSIS — M1610 Unilateral primary osteoarthritis, unspecified hip: Principal | ICD-10-CM | POA: Diagnosis present

## 2013-08-01 DIAGNOSIS — Z96649 Presence of unspecified artificial hip joint: Secondary | ICD-10-CM | POA: Diagnosis not present

## 2013-08-01 DIAGNOSIS — H409 Unspecified glaucoma: Secondary | ICD-10-CM | POA: Diagnosis present

## 2013-08-01 DIAGNOSIS — Z01812 Encounter for preprocedural laboratory examination: Secondary | ICD-10-CM

## 2013-08-01 DIAGNOSIS — R5383 Other fatigue: Secondary | ICD-10-CM

## 2013-08-01 DIAGNOSIS — M25559 Pain in unspecified hip: Secondary | ICD-10-CM | POA: Diagnosis not present

## 2013-08-01 DIAGNOSIS — R5381 Other malaise: Secondary | ICD-10-CM | POA: Diagnosis present

## 2013-08-01 DIAGNOSIS — Z471 Aftercare following joint replacement surgery: Secondary | ICD-10-CM | POA: Diagnosis not present

## 2013-08-01 DIAGNOSIS — M169 Osteoarthritis of hip, unspecified: Secondary | ICD-10-CM | POA: Diagnosis not present

## 2013-08-01 DIAGNOSIS — R350 Frequency of micturition: Secondary | ICD-10-CM | POA: Diagnosis present

## 2013-08-01 DIAGNOSIS — D62 Acute posthemorrhagic anemia: Secondary | ICD-10-CM | POA: Diagnosis not present

## 2013-08-01 DIAGNOSIS — M810 Age-related osteoporosis without current pathological fracture: Secondary | ICD-10-CM | POA: Diagnosis not present

## 2013-08-01 DIAGNOSIS — D500 Iron deficiency anemia secondary to blood loss (chronic): Secondary | ICD-10-CM | POA: Diagnosis not present

## 2013-08-01 HISTORY — PX: TOTAL HIP ARTHROPLASTY: SHX124

## 2013-08-01 LAB — TYPE AND SCREEN
ABO/RH(D): A NEG
Antibody Screen: NEGATIVE

## 2013-08-01 LAB — POCT I-STAT 4, (NA,K, GLUC, HGB,HCT)
Glucose, Bld: 87 mg/dL (ref 70–99)
HCT: 41 % (ref 36.0–46.0)
Hemoglobin: 13.9 g/dL (ref 12.0–15.0)
Potassium: 4 meq/L (ref 3.7–5.3)
Sodium: 141 meq/L (ref 137–147)

## 2013-08-01 SURGERY — ARTHROPLASTY, HIP, TOTAL, ANTERIOR APPROACH
Anesthesia: General | Site: Hip | Laterality: Right

## 2013-08-01 MED ORDER — TRANEXAMIC ACID 100 MG/ML IV SOLN
1000.0000 mg | Freq: Once | INTRAVENOUS | Status: AC
  Administered 2013-08-01: 1000 mg via INTRAVENOUS
  Filled 2013-08-01: qty 10

## 2013-08-01 MED ORDER — CEFAZOLIN SODIUM-DEXTROSE 2-3 GM-% IV SOLR
2.0000 g | INTRAVENOUS | Status: AC
  Administered 2013-08-01: 2 g via INTRAVENOUS

## 2013-08-01 MED ORDER — DIPHENHYDRAMINE HCL 25 MG PO CAPS: 25.0000 mg | ORAL_CAPSULE | Freq: Four times a day (QID) | ORAL | Status: DC | PRN

## 2013-08-01 MED ORDER — LACTATED RINGERS IV SOLN
INTRAVENOUS | Status: DC | PRN
  Administered 2013-08-01 (×2): via INTRAVENOUS

## 2013-08-01 MED ORDER — CISATRACURIUM BESYLATE (PF) 10 MG/5ML IV SOLN
INTRAVENOUS | Status: DC | PRN
  Administered 2013-08-01: 3 mg via INTRAVENOUS
  Administered 2013-08-01: 5 mg via INTRAVENOUS

## 2013-08-01 MED ORDER — HYDROMORPHONE HCL PF 1 MG/ML IJ SOLN
INTRAMUSCULAR | Status: DC | PRN
  Administered 2013-08-01 (×4): 0.5 mg via INTRAVENOUS

## 2013-08-01 MED ORDER — DOCUSATE SODIUM 100 MG PO CAPS
100.0000 mg | ORAL_CAPSULE | Freq: Two times a day (BID) | ORAL | Status: DC
  Administered 2013-08-01 – 2013-08-03 (×4): 100 mg via ORAL
  Filled 2013-08-01 (×5): qty 1

## 2013-08-01 MED ORDER — MENTHOL 3 MG MT LOZG
1.0000 | LOZENGE | OROMUCOSAL | Status: DC | PRN
  Filled 2013-08-01: qty 9

## 2013-08-01 MED ORDER — ONDANSETRON HCL 4 MG/2ML IJ SOLN
INTRAMUSCULAR | Status: DC | PRN
  Administered 2013-08-01 (×2): 2 mg via INTRAVENOUS

## 2013-08-01 MED ORDER — HYDROCODONE-ACETAMINOPHEN 7.5-325 MG PO TABS
1.0000 | ORAL_TABLET | ORAL | Status: DC
  Administered 2013-08-01 – 2013-08-02 (×6): 1 via ORAL
  Filled 2013-08-01 (×5): qty 1
  Filled 2013-08-01: qty 2
  Filled 2013-08-01 (×2): qty 1

## 2013-08-01 MED ORDER — 0.9 % SODIUM CHLORIDE (POUR BTL) OPTIME
TOPICAL | Status: DC | PRN
  Administered 2013-08-01: 1000 mL

## 2013-08-01 MED ORDER — CISATRACURIUM BESYLATE 20 MG/10ML IV SOLN
INTRAVENOUS | Status: AC
  Filled 2013-08-01: qty 10

## 2013-08-01 MED ORDER — HYDROMORPHONE HCL PF 1 MG/ML IJ SOLN: 0.5000 mg | INTRAMUSCULAR | Status: DC | PRN

## 2013-08-01 MED ORDER — BISACODYL 10 MG RE SUPP: 10.0000 mg | Freq: Every day | RECTAL | Status: DC | PRN

## 2013-08-01 MED ORDER — POTASSIUM CHLORIDE 2 MEQ/ML IV SOLN
100.0000 mL/h | INTRAVENOUS | Status: DC
  Administered 2013-08-01 – 2013-08-02 (×2): 100 mL/h via INTRAVENOUS
  Administered 2013-08-03: 20 mL/h via INTRAVENOUS
  Filled 2013-08-01 (×8): qty 1000

## 2013-08-01 MED ORDER — HYDROMORPHONE HCL PF 1 MG/ML IJ SOLN
0.2500 mg | INTRAMUSCULAR | Status: DC | PRN
  Administered 2013-08-01 (×2): 0.25 mg via INTRAVENOUS

## 2013-08-01 MED ORDER — GLYCOPYRROLATE 0.2 MG/ML IJ SOLN
INTRAMUSCULAR | Status: AC
  Filled 2013-08-01: qty 3

## 2013-08-01 MED ORDER — PROPOFOL 10 MG/ML IV BOLUS
INTRAVENOUS | Status: AC
  Filled 2013-08-01: qty 20

## 2013-08-01 MED ORDER — ASPIRIN EC 325 MG PO TBEC
325.0000 mg | DELAYED_RELEASE_TABLET | Freq: Two times a day (BID) | ORAL | Status: DC
  Administered 2013-08-02 – 2013-08-03 (×3): 325 mg via ORAL
  Filled 2013-08-01 (×5): qty 1

## 2013-08-01 MED ORDER — METHOCARBAMOL 500 MG PO TABS: 500.0000 mg | ORAL_TABLET | Freq: Four times a day (QID) | ORAL | Status: DC | PRN

## 2013-08-01 MED ORDER — ONDANSETRON HCL 4 MG/2ML IJ SOLN
4.0000 mg | Freq: Four times a day (QID) | INTRAMUSCULAR | Status: DC | PRN
  Administered 2013-08-01: 4 mg via INTRAVENOUS
  Filled 2013-08-01: qty 2

## 2013-08-01 MED ORDER — FENTANYL CITRATE 0.05 MG/ML IJ SOLN
INTRAMUSCULAR | Status: DC | PRN
  Administered 2013-08-01: 12.5 ug via INTRAVENOUS
  Administered 2013-08-01: 25 ug via INTRAVENOUS
  Administered 2013-08-01: 12.5 ug via INTRAVENOUS
  Administered 2013-08-01 (×2): 25 ug via INTRAVENOUS

## 2013-08-01 MED ORDER — HYDROMORPHONE HCL PF 1 MG/ML IJ SOLN
INTRAMUSCULAR | Status: AC
  Filled 2013-08-01: qty 1

## 2013-08-01 MED ORDER — POLYETHYLENE GLYCOL 3350 17 G PO PACK
17.0000 g | PACK | Freq: Two times a day (BID) | ORAL | Status: DC
  Administered 2013-08-01 – 2013-08-02 (×3): 17 g via ORAL
  Filled 2013-08-01 (×5): qty 1

## 2013-08-01 MED ORDER — PROPOFOL 10 MG/ML IV BOLUS
INTRAVENOUS | Status: DC | PRN
  Administered 2013-08-01: 20 mg via INTRAVENOUS
  Administered 2013-08-01: 130 mg via INTRAVENOUS

## 2013-08-01 MED ORDER — HYDROMORPHONE HCL PF 2 MG/ML IJ SOLN
INTRAMUSCULAR | Status: AC
  Filled 2013-08-01: qty 1

## 2013-08-01 MED ORDER — KETAMINE HCL 10 MG/ML IJ SOLN
INTRAMUSCULAR | Status: AC
  Filled 2013-08-01: qty 1

## 2013-08-01 MED ORDER — EPHEDRINE SULFATE 50 MG/ML IJ SOLN
INTRAMUSCULAR | Status: DC | PRN
  Administered 2013-08-01: 5 mg via INTRAVENOUS
  Administered 2013-08-01 (×3): 10 mg via INTRAVENOUS

## 2013-08-01 MED ORDER — DEXAMETHASONE SODIUM PHOSPHATE 10 MG/ML IJ SOLN: 10.0000 mg | Freq: Once | INTRAMUSCULAR | Status: DC

## 2013-08-01 MED ORDER — CEFAZOLIN SODIUM-DEXTROSE 2-3 GM-% IV SOLR
INTRAVENOUS | Status: AC
  Filled 2013-08-01: qty 50

## 2013-08-01 MED ORDER — LACTATED RINGERS IV SOLN
INTRAVENOUS | Status: DC
Start: 2013-08-01 — End: 2013-08-01
  Administered 2013-08-01: 1000 mL via INTRAVENOUS

## 2013-08-01 MED ORDER — MAGNESIUM CITRATE PO SOLN: 1.0000 | Freq: Once | ORAL | Status: AC | PRN

## 2013-08-01 MED ORDER — PHENOL 1.4 % MT LIQD
1.0000 | OROMUCOSAL | Status: DC | PRN
  Filled 2013-08-01: qty 177

## 2013-08-01 MED ORDER — ONDANSETRON HCL 4 MG PO TABS: 4.0000 mg | ORAL_TABLET | Freq: Four times a day (QID) | ORAL | Status: DC | PRN

## 2013-08-01 MED ORDER — LACTATED RINGERS IV SOLN: INTRAVENOUS | Status: DC

## 2013-08-01 MED ORDER — LIDOCAINE HCL (CARDIAC) 20 MG/ML IV SOLN
INTRAVENOUS | Status: DC | PRN
  Administered 2013-08-01: 30 mg via INTRAVENOUS

## 2013-08-01 MED ORDER — NEOSTIGMINE METHYLSULFATE 1 MG/ML IJ SOLN
INTRAMUSCULAR | Status: DC | PRN
  Administered 2013-08-01: 3 mg via INTRAVENOUS

## 2013-08-01 MED ORDER — STERILE WATER FOR IRRIGATION IR SOLN
Status: DC | PRN
  Administered 2013-08-01: 1500 mL

## 2013-08-01 MED ORDER — DEXAMETHASONE SODIUM PHOSPHATE 10 MG/ML IJ SOLN
10.0000 mg | Freq: Once | INTRAMUSCULAR | Status: AC
  Administered 2013-08-02: 10 mg via INTRAVENOUS
  Filled 2013-08-01: qty 1

## 2013-08-01 MED ORDER — FENTANYL CITRATE 0.05 MG/ML IJ SOLN
INTRAMUSCULAR | Status: AC
  Filled 2013-08-01: qty 2

## 2013-08-01 MED ORDER — CEFAZOLIN SODIUM-DEXTROSE 2-3 GM-% IV SOLR
2.0000 g | Freq: Four times a day (QID) | INTRAVENOUS | Status: AC
  Administered 2013-08-01 (×2): 2 g via INTRAVENOUS
  Filled 2013-08-01 (×2): qty 50

## 2013-08-01 MED ORDER — GLYCOPYRROLATE 0.2 MG/ML IJ SOLN
INTRAMUSCULAR | Status: DC | PRN
  Administered 2013-08-01: 0.2 mg via INTRAVENOUS
  Administered 2013-08-01: 0.4 mg via INTRAVENOUS
  Administered 2013-08-01: 0.2 mg via INTRAVENOUS

## 2013-08-01 MED ORDER — METOCLOPRAMIDE HCL 10 MG PO TABS: 5.0000 mg | ORAL_TABLET | Freq: Three times a day (TID) | ORAL | Status: DC | PRN

## 2013-08-01 MED ORDER — SUCCINYLCHOLINE CHLORIDE 20 MG/ML IJ SOLN
INTRAMUSCULAR | Status: DC | PRN
  Administered 2013-08-01: 100 mg via INTRAVENOUS

## 2013-08-01 MED ORDER — ALUM & MAG HYDROXIDE-SIMETH 200-200-20 MG/5ML PO SUSP: 30.0000 mL | ORAL | Status: DC | PRN

## 2013-08-01 MED ORDER — FERROUS SULFATE 325 (65 FE) MG PO TABS
325.0000 mg | ORAL_TABLET | Freq: Three times a day (TID) | ORAL | Status: DC
  Administered 2013-08-02 – 2013-08-03 (×4): 325 mg via ORAL
  Filled 2013-08-01 (×8): qty 1

## 2013-08-01 MED ORDER — METHOCARBAMOL 100 MG/ML IJ SOLN
500.0000 mg | Freq: Four times a day (QID) | INTRAMUSCULAR | Status: DC | PRN
  Filled 2013-08-01: qty 5

## 2013-08-01 MED ORDER — CHLORHEXIDINE GLUCONATE 4 % EX LIQD: 60.0000 mL | Freq: Once | CUTANEOUS | Status: DC

## 2013-08-01 MED ORDER — METOCLOPRAMIDE HCL 5 MG/ML IJ SOLN
5.0000 mg | Freq: Three times a day (TID) | INTRAMUSCULAR | Status: DC | PRN
  Filled 2013-08-01: qty 2

## 2013-08-01 SURGICAL SUPPLY — 41 items
ADH SKN CLS APL DERMABOND .7 (GAUZE/BANDAGES/DRESSINGS) ×1
BAG SPEC THK2 15X12 ZIP CLS (MISCELLANEOUS)
BAG ZIPLOCK 12X15 (MISCELLANEOUS) IMPLANT
BLADE SAW SGTL 18X1.27X75 (BLADE) ×2 IMPLANT
BLADE SAW SGTL 18X1.27X75MM (BLADE) ×1
CAPT HIP PF MOP ×2 IMPLANT
DERMABOND ADVANCED (GAUZE/BANDAGES/DRESSINGS) ×2
DERMABOND ADVANCED .7 DNX12 (GAUZE/BANDAGES/DRESSINGS) ×1 IMPLANT
DRAPE C-ARM 42X120 X-RAY (DRAPES) ×3 IMPLANT
DRAPE STERI IOBAN 125X83 (DRAPES) ×3 IMPLANT
DRAPE U-SHAPE 47X51 STRL (DRAPES) ×9 IMPLANT
DRSG AQUACEL AG ADV 3.5X10 (GAUZE/BANDAGES/DRESSINGS) ×3 IMPLANT
DRSG TEGADERM 4X4.75 (GAUZE/BANDAGES/DRESSINGS) IMPLANT
DURAPREP 26ML APPLICATOR (WOUND CARE) ×3 IMPLANT
ELECT BLADE TIP CTD 4 INCH (ELECTRODE) ×3 IMPLANT
ELECT REM PT RETURN 9FT ADLT (ELECTROSURGICAL) ×3
ELECTRODE REM PT RTRN 9FT ADLT (ELECTROSURGICAL) ×1 IMPLANT
EVACUATOR 1/8 PVC DRAIN (DRAIN) IMPLANT
FACESHIELD WRAPAROUND (MASK) ×15 IMPLANT
FACESHIELD WRAPAROUND OR TEAM (MASK) ×4 IMPLANT
GAUZE SPONGE 2X2 8PLY STRL LF (GAUZE/BANDAGES/DRESSINGS) IMPLANT
GLOVE BIOGEL PI IND STRL 7.5 (GLOVE) ×1 IMPLANT
GLOVE BIOGEL PI IND STRL 8 (GLOVE) ×1 IMPLANT
GLOVE BIOGEL PI INDICATOR 7.5 (GLOVE) ×2
GLOVE BIOGEL PI INDICATOR 8 (GLOVE) ×2
GLOVE ECLIPSE 8.0 STRL XLNG CF (GLOVE) ×5 IMPLANT
GLOVE ORTHO TXT STRL SZ7.5 (GLOVE) ×6 IMPLANT
GOWN SPEC L3 XXLG W/TWL (GOWN DISPOSABLE) ×3 IMPLANT
GOWN STRL REUS W/TWL LRG LVL3 (GOWN DISPOSABLE) ×3 IMPLANT
KIT BASIN OR (CUSTOM PROCEDURE TRAY) ×3 IMPLANT
PACK TOTAL JOINT (CUSTOM PROCEDURE TRAY) ×3 IMPLANT
PADDING CAST COTTON 6X4 STRL (CAST SUPPLIES) ×3 IMPLANT
SPONGE GAUZE 2X2 STER 10/PKG (GAUZE/BANDAGES/DRESSINGS)
SUT MNCRL AB 4-0 PS2 18 (SUTURE) ×3 IMPLANT
SUT VIC AB 1 CT1 36 (SUTURE) ×9 IMPLANT
SUT VIC AB 2-0 CT1 27 (SUTURE) ×6
SUT VIC AB 2-0 CT1 TAPERPNT 27 (SUTURE) ×2 IMPLANT
SUT VLOC 180 0 24IN GS25 (SUTURE) ×3 IMPLANT
TOWEL OR 17X26 10 PK STRL BLUE (TOWEL DISPOSABLE) ×3 IMPLANT
TOWEL OR NON WOVEN STRL DISP B (DISPOSABLE) IMPLANT
TRAY FOLEY CATH 14FRSI W/METER (CATHETERS) ×3 IMPLANT

## 2013-08-01 NOTE — Op Note (Signed)
NAME:  [name] [name] NO.: [id number]      MEDICAL RECORD NO.: [id number]      FACILITY:  [facility]      PHYSICIAN:  Durene Romans D  DATE OF BIRTH:  1924-03-01     DATE OF PROCEDURE:  08/01/2013                                 OPERATIVE REPORT         PREOPERATIVE DIAGNOSIS: Right  hip osteoarthritis.      POSTOPERATIVE DIAGNOSIS:  Right hip osteoarthritis.      PROCEDURE:  Right total hip replacement through an anterior approach   utilizing DePuy THR system, component size 52mm pinnacle cup, a size 36+4 neutral   Altrex liner, a size 5 Hi Tri Lock stem with a 36+1.5 Articuleze metal ball.      SURGEON:  Madlyn Frankel. [name], M.D.      ASSISTANT:  Lanney Gins, PA-C      ANESTHESIA:  General.      SPECIMENS:  None.      COMPLICATIONS:  None.      BLOOD LOSS:  250 cc     DRAINS:  None     INDICATION OF THE PROCEDURE:  [name] [name] is a 80 y.o. female who had   presented to office for evaluation of right hip pain.  Radiographs revealed   progressive degenerative changes with bone-on-bone   articulation to the  hip joint.  The patient had painful limited range of   motion significantly affecting their overall quality of life.  The patient was failing to    respond to conservative measures, and at this point was ready   to proceed with more definitive measures.  The patient has noted progressive   degenerative changes in his hip, progressive problems and dysfunction   with regarding the hip prior to surgery.  Consent was obtained for   benefit of pain relief.  Specific risk of infection, DVT, component   failure, dislocation, need for revision surgery, as well discussion of   the anterior versus posterior approach were reviewed.  Consent was   obtained for benefit of anterior pain relief through an anterior   approach.      PROCEDURE IN DETAIL:  The patient was brought to operative theater.   Once adequate anesthesia,  preoperative antibiotics, 2gm Ancef administered.   The patient was positioned supine on the OSI Hanna table.  Once adequate   padding of boney process was carried out, we had predraped out the hip, and  used fluoroscopy to confirm orientation of the pelvis and position.      The right hip was then prepped and draped from proximal iliac crest to   mid thigh with shower curtain technique.      Time-out was performed identifying the patient, planned procedure, and   extremity.     An incision was then made 2 cm distal and lateral to the   anterior superior iliac spine extending over the orientation of the   tensor fascia lata muscle and sharp dissection was carried down to the   fascia of the muscle and protractor placed in the soft tissues.      The fascia was then incised.  The muscle belly was identified and swept   laterally  and retractor placed along the superior neck.  Following   cauterization of the circumflex vessels and removing some pericapsular   fat, a second cobra retractor was placed on the inferior neck.  A third   retractor was placed on the anterior acetabulum after elevating the   anterior rectus.  A L-capsulotomy was along the line of the   superior neck to the trochanteric fossa, then extended proximally and   distally.  Tag sutures were placed and the retractors were then placed   intracapsular.  We then identified the trochanteric fossa and   orientation of my neck cut, confirmed this radiographically   and then made a neck osteotomy with the femur on traction.  The femoral   head was removed without difficulty or complication.  Traction was let   off and retractors were placed posterior and anterior around the   acetabulum.      The labrum and foveal tissue were debrided.  I began reaming with a 45mm   reamer and reamed up to 51mm reamer with good bony bed preparation and a 52mm   cup was chosen.  The final 52mm Pinnacle cup was then impacted under fluoroscopy   to confirm the depth of penetration and orientation with respect to   abduction.  A screw was placed followed by the hole eliminator.  The final   36+4 neutral Altrex liner was impacted with good visualized rim fit.  The cup was positioned anatomically within the acetabular portion of the pelvis.      At this point, the femur was rolled at 80 degrees.  Further capsule was   released off the inferior aspect of the femoral neck.  I then   released the superior capsule proximally.  The hook was placed laterally   along the femur and elevated manually and held in position with the bed   hook.  The leg was then extended and adducted with the leg rolled to 100   degrees of external rotation.  Once the proximal femur was fully   exposed, I used a box osteotome to set orientation.  I then began   broaching with the starting chili pepper broach and passed this by hand and then broached up to 5.  With the 5 broach in place I chose a high offset neck and did a trial reduction.  The offset was appropriate, leg lengths   appeared to be equal, confirmed radiographically.   Given these findings, I went ahead and dislocated the hip, repositioned all   retractors and positioned the right hip in the extended and abducted position.  The final 5 Hi Tri Lock stem was   chosen and it was impacted down to the level of neck cut.  Based on this   and the trial reduction, a 36+1.5 Articuleze metal ball was chosen and   impacted onto a clean and dry trunnion, and the hip was reduced.  The   hip had been irrigated throughout the case again at this point.  I did   reapproximate the superior capsular leaflet to the anterior leaflet   using #1 Vicryl.  The fascia of the   tensor fascia lata muscle was then reapproximated using #1 Vicryl and #0 V-lock sutures  The   remaining wound was closed with 2-0 Vicryl and running 4-0 Monocryl.   The hip was cleaned, dried, and dressed sterilely using Dermabond and   Aquacel  dressing.  She was then brought   to recovery room in  stable condition tolerating the procedure well.    Lanney Gins, PA-C was present for the entirety of the case involved from   preoperative positioning, perioperative retractor management, general   facilitation of the case, as well as primary wound closure as assistant.            Madlyn Frankel [name], M.D.        08/01/2013 11:36 AM

## 2013-08-01 NOTE — Anesthesia Postprocedure Evaluation (Signed)
  Anesthesia Post-op Note  Patient: [name] [name]  Procedure(s) Performed: Procedure(s) (LRB): TOTAL RIGHT HIP ARTHROPLASTY ANTERIOR APPROACH (Right)  Patient Location: PACU  Anesthesia Type: General  Level of Consciousness: awake and alert   Airway and Oxygen Therapy: Patient Spontanous Breathing  Post-op Pain: mild  Post-op Assessment: Post-op Vital signs reviewed, Patient's Cardiovascular Status Stable, Respiratory Function Stable, Patent Airway and No signs of Nausea or vomiting  Last Vitals:  Filed Vitals:   08/01/13 1315  BP: 130/65  Pulse: 61  Temp:   Resp: 15    Post-op Vital Signs: stable   Complications: No apparent anesthesia complications

## 2013-08-01 NOTE — Transfer of Care (Signed)
Immediate Anesthesia Transfer of Care Note  Patient: [name] [name]  Procedure(s) Performed: Procedure(s): TOTAL RIGHT HIP ARTHROPLASTY ANTERIOR APPROACH (Right)  Patient Location: PACU  Anesthesia Type:General  Level of Consciousness: awake, alert  and sedated  Airway & Oxygen Therapy: Patient Spontanous Breathing and Patient connected to face mask oxygen  Post-op Assessment: Report given to PACU RN and Post -op Vital signs reviewed and stable  Post vital signs: stable  Complications: No apparent anesthesia complications

## 2013-08-01 NOTE — Interval H&P Note (Signed)
History and Physical Interval Note:  08/01/2013 8:53 AM  [name] [name]  has presented today for surgery, with the diagnosis of OA RIGHT HIP  The various methods of treatment have been discussed with the patient and family. After consideration of risks, benefits and other options for treatment, the patient has consented to  Procedure(s): TOTAL RIGHT HIP ARTHROPLASTY ANTERIOR APPROACH (Right) as a surgical intervention .  The patient's history has been reviewed, patient examined, no change in status, stable for surgery.  I have reviewed the patient's chart and labs.  Questions were answered to the patient's satisfaction.     Shelda PalLIN,Shakita Keir D

## 2013-08-01 NOTE — Anesthesia Preprocedure Evaluation (Signed)
Anesthesia Evaluation  Patient identified by MRN, date of birth, ID band Patient awake    Reviewed: Allergy & Precautions, H&P , NPO status , Patient's Chart, lab work & pertinent test results  Airway Mallampati: II TM Distance: >3 FB Neck ROM: full    Dental no notable dental hx.    Pulmonary neg pulmonary ROS,  breath sounds clear to auscultation  Pulmonary exam normal       Cardiovascular Exercise Tolerance: Good negative cardio ROS  Rhythm:regular Rate:Normal     Neuro/Psych Glaucoma. Spinal stenosis. scoliosis negative neurological ROS  negative psych ROS   GI/Hepatic negative GI ROS, Neg liver ROS,   Endo/Other  negative endocrine ROS  Renal/GU negative Renal ROS  negative genitourinary   Musculoskeletal   Abdominal   Peds  Hematology negative hematology ROS (+)   Anesthesia Other Findings   Reproductive/Obstetrics negative OB ROS                           Anesthesia Physical Anesthesia Plan  ASA: III  Anesthesia Plan: General   Post-op Pain Management:    Induction: Intravenous  Airway Management Planned: Oral ETT  Additional Equipment:   Intra-op Plan:   Post-operative Plan: Extubation in OR  Informed Consent: I have reviewed the patients History and Physical, chart, labs and discussed the procedure including the risks, benefits and alternatives for the proposed anesthesia with the patient or authorized representative who has indicated his/her understanding and acceptance.   Dental Advisory Given  Plan Discussed with: CRNA and Surgeon  Anesthesia Plan Comments:         Anesthesia Quick Evaluation

## 2013-08-01 NOTE — Progress Notes (Signed)
Utilization review completed.  

## 2013-08-02 DIAGNOSIS — D500 Iron deficiency anemia secondary to blood loss (chronic): Secondary | ICD-10-CM | POA: Diagnosis not present

## 2013-08-02 LAB — CBC
HCT: 34.8 % — ABNORMAL LOW (ref 36.0–46.0)
Hemoglobin: 11.7 g/dL — ABNORMAL LOW (ref 12.0–15.0)
MCH: 31 pg (ref 26.0–34.0)
MCHC: 33.6 g/dL (ref 30.0–36.0)
MCV: 92.3 fL (ref 78.0–100.0)
Platelets: 114 10*3/uL — ABNORMAL LOW (ref 150–400)
RBC: 3.77 MIL/uL — ABNORMAL LOW (ref 3.87–5.11)
RDW: 12.7 % (ref 11.5–15.5)
WBC: 12.1 10*3/uL — ABNORMAL HIGH (ref 4.0–10.5)

## 2013-08-02 LAB — BASIC METABOLIC PANEL
BUN: 12 mg/dL (ref 6–23)
CO2: 26 mEq/L (ref 19–32)
Calcium: 8.4 mg/dL (ref 8.4–10.5)
Chloride: 102 mEq/L (ref 96–112)
Creatinine, Ser: 0.68 mg/dL (ref 0.50–1.10)
GFR calc Af Amer: 89 mL/min — ABNORMAL LOW (ref >90)
GFR calc non Af Amer: 76 mL/min — ABNORMAL LOW (ref >90)
Glucose, Bld: 114 mg/dL — ABNORMAL HIGH (ref 70–99)
Potassium: 4.4 mEq/L (ref 3.7–5.3)
Sodium: 137 mEq/L (ref 137–147)

## 2013-08-02 MED ORDER — HYDROCODONE-ACETAMINOPHEN 7.5-325 MG PO TABS: 1.0000 | ORAL_TABLET | ORAL | Status: DC

## 2013-08-02 MED ORDER — ASPIRIN 325 MG PO TBEC: 325.0000 mg | DELAYED_RELEASE_TABLET | Freq: Two times a day (BID) | ORAL | Status: AC

## 2013-08-02 MED ORDER — POLYETHYLENE GLYCOL 3350 17 G PO PACK: 17.0000 g | PACK | Freq: Two times a day (BID) | ORAL | Status: DC

## 2013-08-02 MED ORDER — FERROUS SULFATE 325 (65 FE) MG PO TABS: 325.0000 mg | ORAL_TABLET | Freq: Three times a day (TID) | ORAL | Status: DC

## 2013-08-02 MED ORDER — DSS 100 MG PO CAPS: 100.0000 mg | ORAL_CAPSULE | Freq: Two times a day (BID) | ORAL | Status: DC

## 2013-08-02 MED ORDER — TIZANIDINE HCL 4 MG PO TABS: 4.0000 mg | ORAL_TABLET | Freq: Four times a day (QID) | ORAL | Status: DC | PRN

## 2013-08-02 NOTE — Progress Notes (Signed)
   CARE MANAGEMENT NOTE 08/02/2013  Patient:  Bud FaceARLY,Delayne A   Account Number:  [id number]  Date Initiated:  08/02/2013  Documentation initiated by:  [facility] LPHAVIS,Donta Mcinroy  Subjective/Objective Assessment:   TOTAL RIGHT HIP ARTHROPLASTY ANTERIOR APPROACH     Action/Plan:   HH   Anticipated DC Date:  08/02/2013   Anticipated DC Plan:  HOME W HOME HEALTH SERVICES      DC Planning Services  [facility]      [facility]   Choice offered to / List presented to:  C-1 Patient   DME arranged  [facility]      DME agency  [facility]     HH arranged  HH-1 RN  HH-2 PT      [facility]  [facility]   Status of service:  Completed, signed off Medicare Important Message given?   (If response is "NO", the following Medicare IM given date fields will be blank) Date Medicare IM given:   Date Additional Medicare IM given:    Discharge Disposition:  HOME W HOME HEALTH SERVICES  Per UR Regulation:    If discussed at Long Length of Stay Meetings, dates discussed:    Comments:  08/02/2013 1600 [facility]. Notified [facility] for DME for home. Pt requesting RW. Has 3n1 at home. Dtr, Barbette Merinoebra Butler at home to assist with care.   Isidoro DonningAlesia Dwain Huhn RN CCM Case Mgmt phone [phone]

## 2013-08-02 NOTE — Progress Notes (Signed)
Physical Therapy Treatment Patient Details Name: [name] FaceDorothy A [name] MRN: [id number] DOB: 06/29/1923 Today's Date: 08/02/2013    History of Present Illness      PT Comments    Pt progressed to OOB activity with no c/o dizziness and BP 120/63  Follow Up Recommendations  Home health PT     Equipment Recommendations  Rolling walker with 5" wheels    Recommendations for Other Services OT consult     Precautions / Restrictions Precautions Precautions: Fall Restrictions Weight Bearing Restrictions: No Other Position/Activity Restrictions: WBAT    Mobility  Bed Mobility Overal bed mobility: Needs Assistance Bed Mobility: Supine to Sit     Supine to sit: Min assist;Mod assist     General bed mobility comments: cues for sequence and use of L LE to self assist  Transfers Overall transfer level: Needs assistance Equipment used: Rolling walker (2 wheeled) Transfers: Sit to/from Stand Sit to Stand: Min assist;Mod assist         General transfer comment: cues for LE management and use of UEs to self assist  Ambulation/Gait Ambulation/Gait assistance: Min assist;Mod assist Ambulation Distance (Feet): 89 Feet Assistive device: Rolling walker (2 wheeled) Gait Pattern/deviations: Step-to pattern;Step-through pattern;Shuffle;Trunk flexed;Antalgic Gait velocity: decr   General Gait Details: cues for initial sequence, posture, stride length and position from [employer]W   Stairs            Wheelchair Mobility    Modified Rankin (Stroke Patients Only)       Balance                                    Cognition Arousal/Alertness: Awake/alert Behavior During Therapy: WFL for tasks assessed/performed Overall Cognitive Status: Within Functional Limits for tasks assessed                      Exercises      General Comments        Pertinent Vitals/Pain 4/10;     Home Living                      Prior Function            PT  Goals (current goals can now be found in the care plan section) Acute Rehab PT Goals Patient Stated Goal: Resume previous lifestyle with decreased pain PT Goal Formulation: With patient Time For Goal Achievement: 08/09/13 Potential to Achieve Goals: Good Progress towards PT goals: Progressing toward goals    Frequency  7X/week    PT Plan Current plan remains appropriate    Co-evaluation             End of Session Equipment Utilized During Treatment: Gait belt Activity Tolerance: Patient tolerated treatment well Patient left: in chair;with call bell/phone within reach     Time: [id number] PT Time Calculation (min): 21 min  Charges:  $Gait Training: 8-22 mins                    G Codes:      [name] [name] 08/02/2013, 4:06 PM

## 2013-08-02 NOTE — Progress Notes (Signed)
Physical Therapy Treatment Patient Details Name: [name] FaceDorothy A [name] MRN: [id number] DOB: 05/07/1923 Today's Date: 08/02/2013    History of Present Illness      PT Comments      Follow Up Recommendations  Home health PT     Equipment Recommendations  Rolling walker with 5" wheels    Recommendations for Other Services OT consult     Precautions / Restrictions Precautions Precautions: Fall Restrictions Weight Bearing Restrictions: No Other Position/Activity Restrictions: WBAT    Mobility  Bed Mobility Overal bed mobility: Needs Assistance Bed Mobility: Supine to Sit;Sit to Supine     Supine to sit: Min assist;Mod assist Sit to supine: Min assist;Mod assist   General bed mobility comments: cues for sequence and use of L LE to self assist  Transfers Overall transfer level: Needs assistance Equipment used: Rolling walker (2 wheeled) Transfers: Sit to/from Stand Sit to Stand: Min assist;Mod assist         General transfer comment: cues for LE management and use of UEs to self assist  Ambulation/Gait Ambulation/Gait assistance: Min assist;Mod assist Ambulation Distance (Feet): 132 Feet Assistive device: Rolling walker (2 wheeled) Gait Pattern/deviations: Step-to pattern;Step-through pattern;Decreased step length - right;Decreased step length - left;Shuffle;Trunk flexed Gait velocity: decr   General Gait Details: cues for initial sequence, posture, stride length, ER on R  and position from [employer]   Stairs            Wheelchair Mobility    Modified Rankin (Stroke Patients Only)       Balance                                    Cognition Arousal/Alertness: Awake/alert Behavior During Therapy: WFL for tasks assessed/performed Overall Cognitive Status: Within Functional Limits for tasks assessed                      Exercises      General Comments        Pertinent Vitals/Pain 3/10; premed    Home Living                      Prior Function            PT Goals (current goals can now be found in the care plan section) Acute Rehab PT Goals Patient Stated Goal: Resume previous lifestyle with decreased pain PT Goal Formulation: With patient Time For Goal Achievement: 08/09/13 Potential to Achieve Goals: Good Progress towards PT goals: Progressing toward goals    Frequency  7X/week    PT Plan Current plan remains appropriate    Co-evaluation             End of Session Equipment Utilized During Treatment: Gait belt Activity Tolerance: Patient tolerated treatment well Patient left: with call bell/phone within reach;in bed;with family/visitor present     Time: 6213-08651509-1527 PT Time Calculation (min): 18 min  Charges:  $Gait Training: 8-22 mins                    G Codes:      [name] [name] 08/02/2013, 4:12 PM

## 2013-08-02 NOTE — Progress Notes (Signed)
   Subjective: 1 Day Post-Op Procedure(s) (LRB): TOTAL RIGHT HIP ARTHROPLASTY ANTERIOR APPROACH (Right)   Patient reports pain as mild, pain controlled. No events throughout the night. Ready to work with PT. Denies any SOB or CP.  Objective:   VITALS:   Filed Vitals:   08/02/13 0610  BP: 100/51  Pulse: 71  Temp: 98 F (36.7 C)  Resp: 18    Neurovascular intact Dorsiflexion/Plantar flexion intact Incision: dressing C/D/I No cellulitis present Compartment soft  LABS  Recent Labs  08/01/13 0913 08/02/13 0405  HGB 13.9 11.7*  HCT 41.0 34.8*  WBC  --  12.1*  PLT  --  114*     Recent Labs  08/01/13 0913 08/02/13 0405  NA 141 137  K 4.0 4.4  BUN  --  12  CREATININE  --  0.68  GLUCOSE 87 114*     Assessment/Plan: 1 Day Post-Op Procedure(s) (LRB): TOTAL RIGHT HIP ARTHROPLASTY ANTERIOR APPROACH (Right) Foley cath d/c'ed Advance diet Up with therapy D/C IV fluids Discharge home with home health, when ready possibly Thursday.  Expected ABLA  Treated with iron and will observe      Anastasio AuerbachMatthew S. Aika Brzoska   PAC  08/02/2013, 7:36 AM

## 2013-08-02 NOTE — Evaluation (Signed)
Physical Therapy Evaluation Patient Details Name: [name] FaceDorothy A Espindola MRN: [id number] DOB: 03/26/1924 Today's Date: 08/02/2013   History of Present Illness     Clinical Impression  Pt s/p R THR presents with decreased R LE strength/ROM, post op pain and decreased BP limiting functional mobility.  Pt should progress to d/c home with family assist and HHPT follow up.    Follow Up Recommendations Home health PT    Equipment Recommendations  Rolling walker with 5" wheels    Recommendations for Other Services OT consult     Precautions / Restrictions Precautions Precautions: Fall Restrictions Weight Bearing Restrictions: No Other Position/Activity Restrictions: WBAT      Mobility  Bed Mobility Overal bed mobility:  (NT 2* decreased BP)                Transfers                    Ambulation/Gait                Stairs            Wheelchair Mobility    Modified Rankin (Stroke Patients Only)       Balance                                     Pertinent Vitals/Pain 3/10; premed, ice pack provided    Home Living Family/patient expects to be discharged to:: Private residence Living Arrangements: Spouse/significant other Available Help at Discharge: Family Type of Home: House Home Access: Stairs to enter Entrance Stairs-Rails: None Secretary/administratorntrance Stairs-Number of Steps: 2 Home Layout: One level Home Equipment: None      Prior Function Level of Independence: Independent               Hand Dominance   Dominant Hand: Left    Extremity/Trunk Assessment   Upper Extremity Assessment: Overall WFL for tasks assessed           Lower Extremity Assessment: RLE deficits/detail RLE Deficits / Details: Hip strength 2/5 with AAROM at hip to 80 flex and 15 abd    Cervical / Trunk Assessment: Normal  Communication   Communication: No difficulties  Cognition Arousal/Alertness: Awake/alert Behavior During Therapy: WFL for  tasks assessed/performed Overall Cognitive Status: Within Functional Limits for tasks assessed                      General Comments      Exercises Total Joint Exercises Ankle Circles/Pumps: AROM;Both;15 reps;Supine Quad Sets: AROM;Both;10 reps;Supine Heel Slides: AAROM;20 reps;Right;Supine Hip ABduction/ADduction: AAROM;Right;15 reps;Supine      Assessment/Plan    PT Assessment Patient needs continued PT services  PT Diagnosis Difficulty walking   PT Problem List Decreased strength;Decreased range of motion;Decreased activity tolerance;Decreased mobility;Decreased knowledge of use of DME;Pain  PT Treatment Interventions DME instruction;Functional mobility training;Stair training;Gait training;Therapeutic activities;Therapeutic exercise;Patient/family education   PT Goals (Current goals can be found in the Care Plan section) Acute Rehab PT Goals Patient Stated Goal: Resume previous lifestyle with decreased pain PT Goal Formulation: With patient Time For Goal Achievement: 08/09/13 Potential to Achieve Goals: Good    Frequency 7X/week   Barriers to discharge        End of Session Equipment Utilized During Treatment: Gait belt Activity Tolerance: Patient tolerated treatment well Patient left: in bed;with call bell/phone within reach;with family/visitor present  Time: 1610-9604 PT Time Calculation (min): 20 min   Charges:   PT Evaluation $Initial PT Evaluation Tier I: 1 Procedure PT Treatments $Therapeutic Exercise: 8-22 mins   PT G Codes:          Micai Apolinar 08/02/2013, 12:58 PM

## 2013-08-03 LAB — CBC
HCT: 34.4 % — ABNORMAL LOW (ref 36.0–46.0)
Hemoglobin: 11.3 g/dL — ABNORMAL LOW (ref 12.0–15.0)
MCH: 30.6 pg (ref 26.0–34.0)
MCHC: 32.8 g/dL (ref 30.0–36.0)
MCV: 93.2 fL (ref 78.0–100.0)
Platelets: 108 10*3/uL — ABNORMAL LOW (ref 150–400)
RBC: 3.69 MIL/uL — ABNORMAL LOW (ref 3.87–5.11)
RDW: 12.8 % (ref 11.5–15.5)
WBC: 18.7 10*3/uL — ABNORMAL HIGH (ref 4.0–10.5)

## 2013-08-03 LAB — BASIC METABOLIC PANEL
BUN: 11 mg/dL (ref 6–23)
CO2: 25 mEq/L (ref 19–32)
Calcium: 8.7 mg/dL (ref 8.4–10.5)
Chloride: 106 mEq/L (ref 96–112)
Creatinine, Ser: 0.58 mg/dL (ref 0.50–1.10)
GFR calc Af Amer: >90 mL/min (ref >90)
GFR calc non Af Amer: 81 mL/min — ABNORMAL LOW (ref >90)
Glucose, Bld: 122 mg/dL — ABNORMAL HIGH (ref 70–99)
Potassium: 4.2 mEq/L (ref 3.7–5.3)
Sodium: 140 mEq/L (ref 137–147)

## 2013-08-03 NOTE — Progress Notes (Signed)
   Subjective: 2 Days Post-Op Procedure(s) (LRB): TOTAL RIGHT HIP ARTHROPLASTY ANTERIOR APPROACH (Right)   Patient reports pain as mild, pain controlled. No events throughout the night. Ready to be discharged home.  Objective:   VITALS:   Filed Vitals:   08/03/13  BP: 109/66  Pulse: 69  Temp: 98.5 F (36.9 C)   Resp: 16    Neurovascular intact Dorsiflexion/Plantar flexion intact Incision: dressing C/D/I No cellulitis present Compartment soft  LABS  Recent Labs  08/01/13 0913 08/02/13 0405 08/03/13 0403  HGB 13.9 11.7* 11.3*  HCT 41.0 34.8* 34.4*  WBC  --  12.1* 18.7*  PLT  --  114* 108*     Recent Labs  08/01/13 0913 08/02/13 0405 08/03/13 0403  NA 141 137 140  K 4.0 4.4 4.2  BUN  --  12 11  CREATININE  --  0.68 0.58  GLUCOSE 87 114* 122*     Assessment/Plan: 2 Days Post-Op Procedure(s) (LRB): TOTAL RIGHT HIP ARTHROPLASTY ANTERIOR APPROACH (Right) Up with therapy Discharge home with home health Instructed stay well hydrated at home [facility] Follow up with OLIN,Areta Terwilliger D in 2 weeks.  Contact information:  [facility] [address], [address] [place] [id number] [phone]    Expected ABLA  Treated with iron and will observe       Anastasio AuerbachMatthew S. Munachimso Palin   PAC  08/03/2013, 9:22 AM

## 2013-08-03 NOTE — Evaluation (Signed)
Occupational Therapy Evaluation Patient Details Name: [name] FaceDorothy A [name] MRN: [id number] DOB: 04/14/1924 Today's Date: 08/03/2013    History of Present Illness TOTAL RIGHT HIP ARTHROPLASTY ANTERIOR APPROACH (Right   Clinical Impression   Pt presents to OT s/p THA. Pt plans to Dc home today. All education complete    Follow Up Recommendations  No OT follow up    Equipment Recommendations  None recommended by OT       Precautions / Restrictions Precautions Precautions: Fall Restrictions Weight Bearing Restrictions: No Other Position/Activity Restrictions: WBAT      Mobility Bed Mobility                  Transfers Overall transfer level: Needs assistance Equipment used: Rolling walker (2 wheeled) Transfers: Sit to/from Stand Sit to Stand: Supervision              Balance                                            ADL Overall ADL's : Needs assistance/impaired     Grooming: Standing;Set up               Lower Body Dressing: Sit to/from stand   Toilet Transfer: Ambulation;Supervision/safety   Toileting- Clothing Manipulation and Hygiene: Sit to/from stand;Supervision/safety       Functional mobility during ADLs: Min guard;Cueing for safety;Cueing for sequencing              Hand Dominance Left   Extremity/Trunk Assessment Upper Extremity Assessment Upper Extremity Assessment: Overall WFL for tasks assessed           Communication Communication Communication: No difficulties   Cognition Arousal/Alertness: Awake/alert Behavior During Therapy: WFL for tasks assessed/performed Overall Cognitive Status: Within Functional Limits for tasks assessed                     General Comments       Exercises       Shoulder Instructions      Home Living Family/patient expects to be discharged to:: Private residence Living Arrangements: Spouse/significant other Available Help at Discharge: Family Type of  Home: House Home Access: Stairs to enter Secretary/administratorntrance Stairs-Number of Steps: 2 Entrance Stairs-Rails: None Home Layout: One level     Bathroom Shower/Tub: Producer, television/film/videoWalk-in shower   Bathroom Toilet: Standard     Home Equipment: None          Prior Functioning/Environment Level of Independence: Independent             OT Diagnosis:     OT Problem List:     OT Treatment/Interventions:      OT Goals(Current goals can be found in the care plan section) Acute Rehab OT Goals Patient Stated Goal: Resume previous lifestyle with decreased pain OT Goal Formulation: With patient Time For Goal Achievement: 08/10/13  OT Frequency:     Barriers to D/C:            Co-evaluation              End of Session Nurse Communication: Mobility status  Activity Tolerance: Patient tolerated treatment well Patient left: in chair;with family/visitor present   Time: 0950-1010 OT Time Calculation (min): 20 min Charges:  OT General Charges $OT Visit: 1 Procedure OT Evaluation $Initial OT Evaluation Tier I: 1 Procedure OT Treatments $Self Care/Home Management : 8-22  mins G-Codes:    Alba Cory 06-Aug-2013, 10:12 AM

## 2013-08-03 NOTE — Progress Notes (Signed)
Physical Therapy Treatment Patient Details Name: [name] FaceDorothy A [name] MRN: [id number] DOB: 05/26/1923 Today's Date: 08/03/2013    History of Present Illness TOTAL RIGHT HIP ARTHROPLASTY ANTERIOR APPROACH (Right    PT Comments    Progressing well.  Follow Up Recommendations  Home health PT     Equipment Recommendations  Rolling walker with 5" wheels    Recommendations for Other Services OT consult     Precautions / Restrictions Precautions Precautions: Fall Restrictions Weight Bearing Restrictions: No Other Position/Activity Restrictions: WBAT    Mobility  Bed Mobility Overal bed mobility: Needs Assistance Bed Mobility: Supine to Sit;Sit to Supine     Supine to sit: Supervision Sit to supine: Min guard   General bed mobility comments: cues for sequence and use of L LE to self assist  Transfers Overall transfer level: Needs assistance Equipment used: Rolling walker (2 wheeled) Transfers: Sit to/from Stand Sit to Stand: Supervision         General transfer comment: cues for LE management and use of UEs to self assist  Ambulation/Gait Ambulation/Gait assistance: Min guard;Supervision Ambulation Distance (Feet): 200 Feet (and 20' to bathroom) Assistive device: Rolling walker (2 wheeled) Gait Pattern/deviations: Step-through pattern;Decreased step length - right;Decreased step length - left;Shuffle;Trunk flexed     General Gait Details: cues for initial sequence, posture, stride length, ER on R  and position from [employer]   Stairs Stairs:  (deferred until son's arrival)          Wheelchair Mobility    Modified Rankin (Stroke Patients Only)       Balance                                    Cognition Arousal/Alertness: Awake/alert Behavior During Therapy: WFL for tasks assessed/performed Overall Cognitive Status: Within Functional Limits for tasks assessed                      Exercises Total Joint Exercises Ankle Circles/Pumps:  AROM;Both;15 reps;Supine Quad Sets: AROM;Both;10 reps;Supine Gluteal Sets: AROM;Both;10 reps;Supine Short Arc Quad: AROM;Both;20 reps;Supine Heel Slides: AAROM;20 reps;Right;Supine Hip ABduction/ADduction: AAROM;Right;Supine;20 reps    General Comments        Pertinent Vitals/Pain 3/10; ice pack provided    Home Living Family/patient expects to be discharged to:: Private residence Living Arrangements: Spouse/significant other Available Help at Discharge: Family Type of Home: House Home Access: Stairs to enter Entrance Stairs-Rails: None Home Layout: One level Home Equipment: None      Prior Function Level of Independence: Independent          PT Goals (current goals can now be found in the care plan section) Acute Rehab PT Goals Patient Stated Goal: Resume previous lifestyle with decreased pain PT Goal Formulation: With patient Time For Goal Achievement: 08/09/13 Potential to Achieve Goals: Good Progress towards PT goals: Progressing toward goals    Frequency  7X/week    PT Plan Current plan remains appropriate    Co-evaluation             End of Session   Activity Tolerance: Patient tolerated treatment well Patient left: in bed;with call bell/phone within reach     Time: 0805-0844 PT Time Calculation (min): 39 min  Charges:  $Gait Training: 8-22 mins $Therapeutic Exercise: 8-22 mins $Therapeutic Activity: 8-22 mins                    G  Codes:      Gatlyn Lipari 08/03/2013, 10:15 AM

## 2013-08-03 NOTE — Progress Notes (Signed)
Physical Therapy Treatment Patient Details Name: [name] [name] MRN: [id number] DOB: 19-May-1923 Today's Date: 08/03/2013    History of Present Illness TOTAL RIGHT HIP ARTHROPLASTY ANTERIOR APPROACH (Right    PT Comments      Follow Up Recommendations  Home health PT     Equipment Recommendations  Rolling walker with 5" wheels    Recommendations for Other Services OT consult     Precautions / Restrictions Precautions Precautions: Fall Restrictions Weight Bearing Restrictions: No Other Position/Activity Restrictions: WBAT    Mobility  Bed Mobility Overal bed mobility: Needs Assistance Bed Mobility: Supine to Sit;Sit to Supine     Supine to sit: Supervision Sit to supine: Min guard   General bed mobility comments: cues for sequence and use of L LE to self assist  Transfers Overall transfer level: Needs assistance Equipment used: Rolling walker (2 wheeled) Transfers: Sit to/from Stand Sit to Stand: Supervision         General transfer comment: cues for LE management and use of UEs to self assist  Ambulation/Gait Ambulation/Gait assistance: Min guard;Supervision Ambulation Distance (Feet): 40 Feet Assistive device: Rolling walker (2 wheeled) Gait Pattern/deviations: Step-through pattern;Decreased step length - right;Decreased step length - left;Shuffle;Trunk flexed     General Gait Details: cues for initial sequence, posture, stride length, ER on R  and position from RW   Stairs Stairs: Yes Stairs assistance: Min assist Stair Management: Backwards;No rails;Step to pattern;With walker Number of Stairs: 4 General stair comments: 2 stairs with PT and 2 with son  Wheelchair Mobility    Modified Rankin (Stroke Patients Only)       Balance                                    Cognition Arousal/Alertness: Awake/alert Behavior During Therapy: WFL for tasks assessed/performed Overall Cognitive Status: Within Functional Limits for tasks  assessed                      Exercises Total Joint Exercises Ankle Circles/Pumps: AROM;Both;15 reps;Supine Quad Sets: AROM;Both;10 reps;Supine Gluteal Sets: AROM;Both;10 reps;Supine Short Arc Quad: AROM;Both;20 reps;Supine Heel Slides: AAROM;20 reps;Right;Supine Hip ABduction/ADduction: AAROM;Right;Supine;20 reps    General Comments        Pertinent Vitals/Pain Min c/o pain    Home Living Family/patient expects to be discharged to:: Private residence Living Arrangements: Spouse/significant other Available Help at Discharge: Family Type of Home: House Home Access: Stairs to enter Entrance Stairs-Rails: None Home Layout: One level Home Equipment: None      Prior Function Level of Independence: Independent          PT Goals (current goals can now be found in the care plan section) Acute Rehab PT Goals Patient Stated Goal: Resume previous lifestyle with decreased pain PT Goal Formulation: With patient Time For Goal Achievement: 08/09/13 Potential to Achieve Goals: Good Progress towards PT goals: Progressing toward goals    Frequency  7X/week    PT Plan Current plan remains appropriate    Co-evaluation             End of Session   Activity Tolerance: Patient tolerated treatment well Patient left: in chair;with call bell/phone within reach;with family/visitor present     Time: 4098-1191 PT Time Calculation (min): 11 min  Charges:  $Gait Training: 8-22 mins $Therapeutic Exercise: 8-22 mins $Therapeutic Activity: 8-22 mins  G Codes:      Maye Parkinson 08/03/2013, 10:22 AM

## 2013-08-03 NOTE — Progress Notes (Signed)
Pt stable, scripts, d/c instructions given with no questions/concerns voiced by pt or son.  Pt transported via wheelchair to private vehicle by NT.

## 2013-08-04 DIAGNOSIS — Z96649 Presence of unspecified artificial hip joint: Secondary | ICD-10-CM | POA: Diagnosis not present

## 2013-08-04 DIAGNOSIS — M48061 Spinal stenosis, lumbar region without neurogenic claudication: Secondary | ICD-10-CM | POA: Diagnosis not present

## 2013-08-04 DIAGNOSIS — M159 Polyosteoarthritis, unspecified: Secondary | ICD-10-CM | POA: Diagnosis not present

## 2013-08-04 DIAGNOSIS — Z471 Aftercare following joint replacement surgery: Secondary | ICD-10-CM | POA: Diagnosis not present

## 2013-08-04 DIAGNOSIS — M810 Age-related osteoporosis without current pathological fracture: Secondary | ICD-10-CM | POA: Diagnosis not present

## 2013-08-07 DIAGNOSIS — Z96649 Presence of unspecified artificial hip joint: Secondary | ICD-10-CM | POA: Diagnosis not present

## 2013-08-07 DIAGNOSIS — M159 Polyosteoarthritis, unspecified: Secondary | ICD-10-CM | POA: Diagnosis not present

## 2013-08-07 DIAGNOSIS — Z471 Aftercare following joint replacement surgery: Secondary | ICD-10-CM | POA: Diagnosis not present

## 2013-08-07 DIAGNOSIS — M810 Age-related osteoporosis without current pathological fracture: Secondary | ICD-10-CM | POA: Diagnosis not present

## 2013-08-07 DIAGNOSIS — M48061 Spinal stenosis, lumbar region without neurogenic claudication: Secondary | ICD-10-CM | POA: Diagnosis not present

## 2013-08-08 DIAGNOSIS — Z96649 Presence of unspecified artificial hip joint: Secondary | ICD-10-CM | POA: Diagnosis not present

## 2013-08-08 DIAGNOSIS — Z471 Aftercare following joint replacement surgery: Secondary | ICD-10-CM | POA: Diagnosis not present

## 2013-08-08 DIAGNOSIS — M159 Polyosteoarthritis, unspecified: Secondary | ICD-10-CM | POA: Diagnosis not present

## 2013-08-08 DIAGNOSIS — M48061 Spinal stenosis, lumbar region without neurogenic claudication: Secondary | ICD-10-CM | POA: Diagnosis not present

## 2013-08-08 DIAGNOSIS — M810 Age-related osteoporosis without current pathological fracture: Secondary | ICD-10-CM | POA: Diagnosis not present

## 2013-08-08 NOTE — Discharge Summary (Signed)
Physician Discharge Summary  Patient ID: [name] FaceDorothy A [name] MRN: [id number] DOB/AGE: 78/06/1923 78 y.o.  Admit date: 08/01/2013 Discharge date: 08/03/2013   Procedures:  Procedure(s) (LRB): TOTAL RIGHT HIP ARTHROPLASTY ANTERIOR APPROACH (Right)  Attending Physician:  Dr. Durene RomansMatthew Olin   Admission Diagnoses:   Right hip OA / pain  Discharge Diagnoses:  Principal Problem:   S/P right THA, AA Active Problems:   Expected blood loss anemia  Past Medical History  Diagnosis Date  . Arthritis   . Osteoporosis   . Frequency of urination   . Glaucoma     HPI: [name] [name], 80 y.o. female, has a history of pain and functional disability in the right hip(s) due to arthritis and patient has failed non-surgical conservative treatments for greater than 12 weeks to include NSAID's and/or analgesics, corticosteriod injections and activity modification. Onset of symptoms was gradual starting 4-5 years ago with gradually worsening course since that time.The patient noted no past surgery on the right hip(s). Patient currently rates pain in the right hip at 10 out of 10 with activity. Patient has night pain, worsening of pain with activity and weight bearing, trendelenberg gait, pain that interfers with activities of daily living and pain with passive range of motion. Patient has evidence of periarticular osteophytes and joint space narrowing by imaging studies. This condition presents safety issues increasing the risk of falls. There is no current active infection. Risks, benefits and expectations were discussed with the patient. Risks including but not limited to the risk of anesthesia, blood clots, nerve damage, blood vessel damage, failure of the prosthesis, infection and up to and including death. Patient understand the risks, benefits and expectations and wishes to proceed with surgery.   PCP: Catalina PizzaHALL, ZACH, MD   Discharged Condition: good  Hospital Course:  Patient underwent the above stated  procedure on 08/01/2013. Patient tolerated the procedure well and brought to the recovery room in good condition and subsequently to the floor.  POD #1 BP: 100/51 ; Pulse: 71 ; Temp: 98 F (36.7 C) ; Resp: 18  Patient reports pain as mild, pain controlled. No events throughout the night. Ready to work with PT. Denies any SOB or CP. Neurovascular intact, dorsiflexion/plantar flexion intact, incision: dressing C/D/I, no cellulitis present and compartment soft.   LABS  Basename    HGB  11.7  HCT  34.8   POD #2  BP: 109/66 ; Pulse: 69 ; Temp: 98.5 F (36.9 C) ; Resp: 16  Patient reports pain as mild, pain controlled. No events throughout the night. Ready to be discharged home. Neurovascular intact, dorsiflexion/plantar flexion intact, incision: dressing C/D/I, no cellulitis present and compartment soft.   LABS  Basename    HGB  11.3  HCT  34.4    Discharge Exam: General appearance: alert, cooperative and no distress Extremities: Homans sign is negative, no sign of DVT, no edema, redness or tenderness in the calves or thighs and no ulcers, gangrene or trophic changes  Disposition:      Home-Health Care Svc with follow up in 2 weeks   Follow-up Information   Follow up with Shelda PalLIN,Parilee Hally D, MD. Schedule an appointment as soon as possible for a visit in 2 weeks.   Specialty:  Orthopedic Surgery   Contact information:   [address] [place] [address] [phone]       Follow up with [facility]. (Home Health Physical Therapy and RN)    Contact information:   [phone] [facility]  [address] [place] [address] [phone]       Discharge Orders   Future Orders Complete By Expires   Call MD / Call 911  As directed    Comments:     If you experience chest pain or shortness of breath, CALL 911 and be transported to the hospital emergency room.  If you develope a fever above 101 F, pus (white drainage) or increased drainage or redness at the wound,  or calf pain, call your surgeon's office.   Change dressing  As directed    Comments:     Maintain surgical dressing for 10-14 days, or until follow up in the clinic.   Constipation Prevention  As directed    Comments:     Drink plenty of fluids.  Prune juice may be helpful.  You may use a stool softener, such as Colace (over the counter) 100 mg twice a day.  Use MiraLax (over the counter) for constipation as needed.   Diet - low sodium heart healthy  As directed    Discharge instructions  As directed    Comments:     Maintain surgical dressing for 10-14 days, or until follow up in the clinic. Follow up in 2 weeks at [facility]. Call with any questions or concerns.   Driving restrictions  As directed    Comments:     No driving for 4 weeks   Increase activity slowly as tolerated  As directed    TED hose  As directed    Comments:     Use stockings (TED hose) for 2 weeks on both leg(s).  You may remove them at night for sleeping.   Weight bearing as tolerated  As directed    Questions:     Laterality:     Extremity:          Medication List         aspirin 325 MG EC tablet  Take 1 tablet (325 mg total) by mouth 2 (two) times daily.     DSS 100 MG Caps  Take 100 mg by mouth 2 (two) times daily.     ferrous sulfate 325 (65 FE) MG tablet  Take 1 tablet (325 mg total) by mouth 3 (three) times daily after meals.     HYDROcodone-acetaminophen 7.5-325 MG per tablet  Commonly known as:  NORCO  Take 1-2 tablets by mouth every 4 (four) hours.     polyethylene glycol packet  Commonly known as:  MIRALAX / GLYCOLAX  Take 17 g by mouth 2 (two) times daily.     tiZANidine 4 MG tablet  Commonly known as:  ZANAFLEX  Take 1 tablet (4 mg total) by mouth every 6 (six) hours as needed.         Signed: Anastasio Auerbach. Shanoah Asbill   PAC  08/08/2013, 9:50 AM

## 2013-08-09 DIAGNOSIS — M48061 Spinal stenosis, lumbar region without neurogenic claudication: Secondary | ICD-10-CM | POA: Diagnosis not present

## 2013-08-09 DIAGNOSIS — Z471 Aftercare following joint replacement surgery: Secondary | ICD-10-CM | POA: Diagnosis not present

## 2013-08-09 DIAGNOSIS — Z96649 Presence of unspecified artificial hip joint: Secondary | ICD-10-CM | POA: Diagnosis not present

## 2013-08-09 DIAGNOSIS — M810 Age-related osteoporosis without current pathological fracture: Secondary | ICD-10-CM | POA: Diagnosis not present

## 2013-08-09 DIAGNOSIS — M159 Polyosteoarthritis, unspecified: Secondary | ICD-10-CM | POA: Diagnosis not present

## 2013-08-10 DIAGNOSIS — Z471 Aftercare following joint replacement surgery: Secondary | ICD-10-CM | POA: Diagnosis not present

## 2013-08-10 DIAGNOSIS — M48061 Spinal stenosis, lumbar region without neurogenic claudication: Secondary | ICD-10-CM | POA: Diagnosis not present

## 2013-08-10 DIAGNOSIS — M810 Age-related osteoporosis without current pathological fracture: Secondary | ICD-10-CM | POA: Diagnosis not present

## 2013-08-10 DIAGNOSIS — M159 Polyosteoarthritis, unspecified: Secondary | ICD-10-CM | POA: Diagnosis not present

## 2013-08-10 DIAGNOSIS — Z96649 Presence of unspecified artificial hip joint: Secondary | ICD-10-CM | POA: Diagnosis not present

## 2013-08-14 DIAGNOSIS — Z471 Aftercare following joint replacement surgery: Secondary | ICD-10-CM | POA: Diagnosis not present

## 2013-08-14 DIAGNOSIS — M48061 Spinal stenosis, lumbar region without neurogenic claudication: Secondary | ICD-10-CM | POA: Diagnosis not present

## 2013-08-14 DIAGNOSIS — M159 Polyosteoarthritis, unspecified: Secondary | ICD-10-CM | POA: Diagnosis not present

## 2013-08-14 DIAGNOSIS — M810 Age-related osteoporosis without current pathological fracture: Secondary | ICD-10-CM | POA: Diagnosis not present

## 2013-08-14 DIAGNOSIS — Z96649 Presence of unspecified artificial hip joint: Secondary | ICD-10-CM | POA: Diagnosis not present

## 2013-08-15 DIAGNOSIS — Z96649 Presence of unspecified artificial hip joint: Secondary | ICD-10-CM | POA: Diagnosis not present

## 2013-08-15 DIAGNOSIS — M159 Polyosteoarthritis, unspecified: Secondary | ICD-10-CM | POA: Diagnosis not present

## 2013-08-15 DIAGNOSIS — Z471 Aftercare following joint replacement surgery: Secondary | ICD-10-CM | POA: Diagnosis not present

## 2013-08-15 DIAGNOSIS — M810 Age-related osteoporosis without current pathological fracture: Secondary | ICD-10-CM | POA: Diagnosis not present

## 2013-08-15 DIAGNOSIS — M48061 Spinal stenosis, lumbar region without neurogenic claudication: Secondary | ICD-10-CM | POA: Diagnosis not present

## 2013-08-17 DIAGNOSIS — M159 Polyosteoarthritis, unspecified: Secondary | ICD-10-CM | POA: Diagnosis not present

## 2013-08-17 DIAGNOSIS — Z471 Aftercare following joint replacement surgery: Secondary | ICD-10-CM | POA: Diagnosis not present

## 2013-08-17 DIAGNOSIS — Z96649 Presence of unspecified artificial hip joint: Secondary | ICD-10-CM | POA: Diagnosis not present

## 2013-08-17 DIAGNOSIS — M810 Age-related osteoporosis without current pathological fracture: Secondary | ICD-10-CM | POA: Diagnosis not present

## 2013-08-17 DIAGNOSIS — M48061 Spinal stenosis, lumbar region without neurogenic claudication: Secondary | ICD-10-CM | POA: Diagnosis not present

## 2013-08-21 DIAGNOSIS — Z471 Aftercare following joint replacement surgery: Secondary | ICD-10-CM | POA: Diagnosis not present

## 2013-08-21 DIAGNOSIS — M159 Polyosteoarthritis, unspecified: Secondary | ICD-10-CM | POA: Diagnosis not present

## 2013-08-21 DIAGNOSIS — Z96649 Presence of unspecified artificial hip joint: Secondary | ICD-10-CM | POA: Diagnosis not present

## 2013-08-21 DIAGNOSIS — M48061 Spinal stenosis, lumbar region without neurogenic claudication: Secondary | ICD-10-CM | POA: Diagnosis not present

## 2013-08-21 DIAGNOSIS — M810 Age-related osteoporosis without current pathological fracture: Secondary | ICD-10-CM | POA: Diagnosis not present

## 2013-08-22 DIAGNOSIS — Z471 Aftercare following joint replacement surgery: Secondary | ICD-10-CM | POA: Diagnosis not present

## 2013-08-22 DIAGNOSIS — M48061 Spinal stenosis, lumbar region without neurogenic claudication: Secondary | ICD-10-CM | POA: Diagnosis not present

## 2013-08-22 DIAGNOSIS — Z96649 Presence of unspecified artificial hip joint: Secondary | ICD-10-CM | POA: Diagnosis not present

## 2013-08-22 DIAGNOSIS — M810 Age-related osteoporosis without current pathological fracture: Secondary | ICD-10-CM | POA: Diagnosis not present

## 2013-08-22 DIAGNOSIS — M159 Polyosteoarthritis, unspecified: Secondary | ICD-10-CM | POA: Diagnosis not present

## 2013-08-23 ENCOUNTER — Ambulatory Visit (HOSPITAL_COMMUNITY)
Admission: RE | Admit: 2013-08-23 | Discharge: 2013-08-23 | Disposition: A | Discharge status: Home / Self Care | Payer: Medicare Other | Source: Ambulatory Visit | Attending: Orthopedic Surgery | Admitting: Orthopedic Surgery

## 2013-08-23 DIAGNOSIS — IMO0001 Reserved for inherently not codable concepts without codable children: Secondary | ICD-10-CM | POA: Diagnosis not present

## 2013-08-23 DIAGNOSIS — R2689 Other abnormalities of gait and mobility: Secondary | ICD-10-CM

## 2013-08-23 DIAGNOSIS — R262 Difficulty in walking, not elsewhere classified: Secondary | ICD-10-CM | POA: Insufficient documentation

## 2013-08-23 DIAGNOSIS — R269 Unspecified abnormalities of gait and mobility: Secondary | ICD-10-CM | POA: Diagnosis not present

## 2013-08-23 DIAGNOSIS — R29898 Other symptoms and signs involving the musculoskeletal system: Secondary | ICD-10-CM | POA: Diagnosis not present

## 2013-08-23 DIAGNOSIS — M25559 Pain in unspecified hip: Secondary | ICD-10-CM | POA: Diagnosis not present

## 2013-08-23 NOTE — Evaluation (Signed)
Physical Therapy Evaluation  Patient Details  Name: [name] [name] MRN: [id number] Date of Birth: 03/21/1924  Today's Date: 08/23/2013 Time: 1420-1500 PT Time Calculation (min): 40 min Charge evaluation.             Visit#: 1 of 12  Re-eval: 09/22/13 Assessment Diagnosis: Rt anterior hip replacement Surgical Date: 08/01/13 Next MD Visit:  (09/20/2013)  Authorization: medicare        Past Medical History:  Past Medical History  Diagnosis Date  . Arthritis   . Osteoporosis   . Frequency of urination   . Glaucoma    Past Surgical History:  Past Surgical History  Procedure Laterality Date  . Cholecystectomy  1980'S  . Back surgery  1967 / 1970'S    X 2  . Wrist fracture surgery    . Total hip arthroplasty Right 08/01/2013    Procedure: TOTAL RIGHT HIP ARTHROPLASTY ANTERIOR APPROACH;  Surgeon: Shelda PalMatthew D Olin, MD;  Location: [facility];  Service: Orthopedics;  Laterality: Right;    Subjective Symptoms/Limitations Symptoms: [name] [name] is an 80 yo who underwent a Rt anterior hip replacement on 08/01/2013 and was discharged on 08/03/13.  She has been recieving HH until 08/22/2013.  The patient states that her hip was bothering for several years before she decided to have her hip replaced.  She states she got to the point that she could not walk.  She is now on a walker and would like to become as functional al possible.  How long can you sit comfortably?: Pt is able to sit for as long as she wants  How long can you stand comfortably?: Able to stand without holding on for four minutes-tested in department How long can you walk comfortably?: Able to walk ten minutes without stopping . not tested subjective. Pain Assessment Currently in Pain?: Yes Pain Score: 5  Pain Location: Hip Pain Orientation: Right Pain Type: Surgical pain Pain Onset: 1 to 4 weeks ago Pain Frequency: Intermittent Effect of Pain on Daily Activities: increases   Balance screening:  No falls       Sensation/Coordination/Flexibility/Functional Tests Functional Tests Functional Tests: [id number]  Assessment RLE Strength Right Hip Flexion: 2+/5 Right Hip Extension: 2+/5 Right Hip ABduction: 2+/5 Right Hip ADduction: 5/5 Right Knee Extension: 5/5 Right Ankle Dorsiflexion: 3+/5 LLE Strength Left Hip Flexion: 5/5 Left Hip Extension: 3-/5 Left Hip ABduction: 5/5 Left Knee Flexion: 5/5 Left Knee Extension: 5/5 Left Ankle Dorsiflexion: 5/5  Exercise/Treatments Mobility/Balance  Berg Balance Test Sit to Stand: Able to stand without using hands and stabilize independently Standing Unsupported: Able to stand 2 minutes with supervision Sitting with Back Unsupported but Feet Supported on Floor or Stool: Able to sit safely and securely 2 minutes Stand to Sit: Controls descent by using hands Transfers: Able to transfer safely, definite need of hands Standing Unsupported with Eyes Closed: Able to stand 10 seconds safely Standing Ubsupported with Feet Together: Able to place feet together independently and stand for 1 minute with supervision From Standing, Reach Forward with Outstretched Arm: Can reach forward >12 cm safely (5") From Standing Position, Pick up Object from Floor: Able to pick up shoe safely and easily From Standing Position, Turn to Look Behind Over each Shoulder: Turn sideways only but maintains balance Turn 360 Degrees: Able to turn 360 degrees safely but slowly Standing Unsupported, Alternately Place Feet on Step/Stool: Able to stand independently and complete 8 steps >20 seconds Standing Unsupported, One Foot in Front: Able to plae foot ahead of the  other independently and hold 30 seconds Standing on One Leg: Tries to lift leg/unable to hold 3 seconds but remains standing independently Total Score: 42     Seated Other Seated Knee Exercises: ankle dorsiiflexion/plantarflexion x 10 Supine Heel Slides: 5 reps Bridges: 10 reps Straight Leg Raises: 10  reps;Limitations Straight Leg Raises Limitations: with knee bent Sidelying Hip ABduction: 10 reps   Physical Therapy Assessment and Plan PT Assessment and Plan Clinical Impression Statement: Pt is an 80 yo who had a Rt anterior hip replacement on 08/01/2013.  She has been referred to therapy to maximize her functional ability.  Pt exam shows decreased strength and balance.  Pt will benefit from skilled PT to maximize her functional ability and improve her safety.  Pt will benefit from skilled therapeutic intervention in order to improve on the following deficits: Decreased activity tolerance;Decreased strength;Difficulty walking;Pain Rehab Potential: Good PT Frequency: Min 3X/week PT Duration: 4 weeks PT Treatment/Interventions: Gait training;Therapeutic activities;Therapeutic exercise;Stair training;Balance training;Neuromuscular re-education;Patient/family education PT Plan: Pt to begin balance activities as well as gt training with SPC    Goals Home Exercise Program Pt/caregiver will Perform Home Exercise Program: For increased strengthening PT Goal: Perform Home Exercise Program - Progress: Goal set today PT Short Term Goals Time to Complete Short Term Goals: 2 weeks PT Short Term Goal 1: PT to be ambulating inside the house with her cane PT Short Term Goal 2: Pt to be able to stand for 10 minutes to make a quick meal PT Short Term Goal 3: Pt to be able to walk for 20 mintues without stopping for better health habits PT Long Term Goals Time to Complete Long Term Goals: 4 weeks PT Long Term Goal 1: Pt to be able to walk outside with a cane. PT Long Term Goal 2: Pt to be able to stand for 30 minutes to make a meal Long Term Goal 3: Pt to be able to walk for 30 mintues without stopping to be able to shop  Long Term Goal 4: Pt berg to be improved by 10 pts to decrease risk of falling.  Problem List Patient Active Problem List   Diagnosis Date Noted  . Right leg weakness 08/23/2013   . Unstable balance 08/23/2013  . Expected blood loss anemia 08/02/2013  . S/P right THA, AA 08/01/2013  . MUSCLE WEAKNESS (GENERALIZED) 11/29/2009  . FATIGUE 11/29/2009  . GENERALIZED PAIN 11/29/2009  . OSTEOARTHRITIS, HIP, RIGHT 09/16/2009  . HIP PAIN 03/14/2009  . SPINAL STENOSIS 03/14/2009  . SCOLIOSIS, LUMBAR SPINE 03/14/2009    PT - End of Session Equipment Utilized During Treatment: Gait belt Activity Tolerance: Patient tolerated treatment well PT Plan of Care PT Home Exercise Plan: given  GP Functional Assessment Tool Used: foto Functional Limitation: Mobility: Walking and moving around Mobility: Walking and Moving Around Current Status ([id number](G8978): At least 60 percent but less than 80 percent impaired, limited or restricted Mobility: Walking and Moving Around Goal Status [phone](G8979): At least 40 percent but less than 60 percent impaired, limited or restricted  Bella KennedyCynthia J Embrie Mikkelsen 08/23/2013, 3:29 PM  Physician Documentation Your signature is required to indicate approval of the treatment plan as stated above.  Please sign and either send electronically or make a copy of this report for your files and return this physician signed original.   Please mark one 1.__approve of plan  2. ___approve of plan with the following conditions.   ______________________________  _____________________ Physician Signature                                                                                                             Date

## 2013-08-28 ENCOUNTER — Ambulatory Visit (HOSPITAL_COMMUNITY): Payer: Medicare Other | Admitting: Physical Therapy

## 2013-08-30 ENCOUNTER — Ambulatory Visit (HOSPITAL_COMMUNITY)
Admission: RE | Admit: 2013-08-30 | Discharge: 2013-08-30 | Disposition: A | Discharge status: Home / Self Care | Payer: Medicare Other | Source: Ambulatory Visit | Attending: Orthopedic Surgery | Admitting: Orthopedic Surgery

## 2013-08-30 DIAGNOSIS — R2689 Other abnormalities of gait and mobility: Secondary | ICD-10-CM

## 2013-08-30 DIAGNOSIS — R29898 Other symptoms and signs involving the musculoskeletal system: Secondary | ICD-10-CM

## 2013-08-30 NOTE — Progress Notes (Signed)
Physical Therapy Treatment Patient Details  Name: [name] [name] MRN: [id number] Date of Birth: 12/13/1923  Today's Date: 08/30/2013 Time: 1345-1430 PT Time Calculation (min): 45 min Charge: Gait 1345-1400, NMR 1400-1430  Visit#: 2 of 12  Re-eval: 09/22/13 Assessment Diagnosis: Rt anterior hip replacement Surgical Date: 08/01/13 Next MD Visit: Charlann BoxerOlin 09/20/2013  Authorization: medicare  Authorization Time Period:    Authorization Visit#:   of     Subjective: Symptoms/Limitations Symptoms: Pt reports compliance with HEP, no questions with any exercise.  Minimum hip and knee pain today, 2-3/10 pain scale Pain Assessment Currently in Pain?: Yes Pain Score: 3  Pain Location: Knee Pain Orientation: Right  Precautions/Restrictions  Precautions Precautions: Anterior Hip  Exercise/Treatments Balance Exercises Standing Standing Eyes Opened: Narrow base of support (BOS);2 reps;Limitations Standing Eyes Opened Limitations: 1' NBOS, 1' NBOS with perturbation Tandem Stance: Eyes open;3 reps;30 secs;Limitations Tandem Stance Limitations: no HHA Tandem Gait: Forward;1 rep Retro Gait: 1 rep Sidestepping: 1 rep Heel Raises: Both;10 reps;Limitations Heel Raises Limitations: no HHA Toe Raise: Both;10 reps;Limitations Toe Raise Limitations: 1 finger A Other Standing Exercises: Gait training with RW for heel to toe pattern and equalize stride length; 2RT around dept; SPC 4RT around dept with pt verbalizing appropriate sequence for 3 and 2 step sequencing Other Standing Exercises: Rockerboard R/L and A/P 2 min each with 2 finger A   Physical Therapy Assessment and Plan PT Assessment and Plan Clinical Impression Statement: Began gait training with RW for appropriate heel to toe pattern and equalized stride length without difficulty following cueing.  Progressed to gait training with SPC with cueing for posture and sequencing.  Pt able to verbalize approriate hand use and sequencing with 3  and 2 step pattern sequencing with SPC.  Added rockerboard to improve weight distribution with gait.  Began balance training with min assistance and vc-ing to improve spatial awareness with less assistance required following cueing.  Pt limited by fatigue at end of session, no reoprts of pain through session.  Improved gait mechanics at end of session, believe pt will be ready to progress to [facility] next sessoin.  Have requested pt to continue ambulating with RW but to bring [facility] own [facility] for appropriate height next session.   PT Plan: Continue with balance training and gait training with SPC.  Next session begin sit to stands without HHA and SLS.      Goals Home Exercise Program Pt/caregiver will Perform Home Exercise Program: For increased strengthening PT Short Term Goals Time to Complete Short Term Goals: 2 weeks PT Short Term Goal 1: PT to be ambulating inside the house with her cane PT Short Term Goal 1 - Progress: Progressing toward goal PT Short Term Goal 2: Pt to be able to stand for 10 minutes to make a quick meal PT Short Term Goal 2 - Progress: Progressing toward goal PT Short Term Goal 3: Pt to be able to walk for 20 mintues without stopping for better health habits PT Long Term Goals Time to Complete Long Term Goals: 4 weeks PT Long Term Goal 1: Pt to be able to walk outside with a cane. PT Long Term Goal 2: Pt to be able to stand for 30 minutes to make a meal Long Term Goal 3: Pt to be able to walk for 30 mintues without stopping to be able to shop  Long Term Goal 4: Pt berg to be improved by 10 pts to decrease risk of falling. Long Term Goal 4 Progress:  Progressing toward goal  Problem List Patient Active Problem List   Diagnosis Date Noted  . Right leg weakness 08/23/2013  . Unstable balance 08/23/2013  . Expected blood loss anemia 08/02/2013  . S/P right THA, AA 08/01/2013  . MUSCLE WEAKNESS (GENERALIZED) 11/29/2009  . FATIGUE 11/29/2009  . GENERALIZED PAIN  11/29/2009  . OSTEOARTHRITIS, HIP, RIGHT 09/16/2009  . HIP PAIN 03/14/2009  . SPINAL STENOSIS 03/14/2009  . SCOLIOSIS, LUMBAR SPINE 03/14/2009    PT - End of Session Equipment Utilized During Treatment: Gait belt Activity Tolerance: Patient tolerated treatment well General Behavior During Therapy: [facility]FL for tasks assessed/performed  GP    Juel Burrowasey Jo Nicklous Aburto 08/30/2013, 3:15 PM

## 2013-08-31 ENCOUNTER — Ambulatory Visit (HOSPITAL_COMMUNITY)
Admission: RE | Admit: 2013-08-31 | Discharge: 2013-08-31 | Disposition: A | Discharge status: Home / Self Care | Payer: Medicare Other | Source: Ambulatory Visit | Attending: Internal Medicine | Admitting: Internal Medicine

## 2013-08-31 DIAGNOSIS — R2689 Other abnormalities of gait and mobility: Secondary | ICD-10-CM

## 2013-08-31 DIAGNOSIS — R29898 Other symptoms and signs involving the musculoskeletal system: Secondary | ICD-10-CM

## 2013-08-31 NOTE — Progress Notes (Signed)
Physical Therapy Treatment Patient Details  Name: [name] [name] MRN: [id number] Date of Birth: 11/14/1923  Today's Date: 08/31/2013 Time: 4098-11911302-1345 PT Time Calculation (min): 43 min Charge: NMR [id number], Gait 1308-65781335-1345  Visit#: 3 of 12  Re-eval: 09/22/13 Assessment Diagnosis: Rt anterior hip replacement Surgical Date: 08/01/13 Next MD Visit: Charlann BoxerOlin 09/20/2013  Authorization: medicare  Authorization Time Period:    Authorization Visit#:   of     Subjective: Symptoms/Limitations Symptoms: No c/o pain today.  Pt stated most difficulty is walking.   Pain Assessment Currently in Pain?: No/denies  Precautions/Restrictions  Precautions Precautions: Anterior Hip  Exercise/Treatments Balance Exercises Standing Tandem Stance: Eyes open;3 reps;30 secs;Limitations Tandem Stance Limitations: no HHA SLS: Time SLS Time: L31", Rt 31" max of 3 Tandem Gait: Forward;Retro;2 reps Retro Gait: 2 reps Marching: Solid surface;10 reps;Limitations Marching Limitations: 5" holds no HHA Sit to Stand: Standard surface;Limitations Sit to Stand Limitations: 5 reps no HHA Other Standing Exercises: Gait training with SPC 5 RT arouind dept with 2 step sequencing Other Standing Exercises: Rockerboard R/L and A/P 2 min each with 2 finger A; 3D hip excursion   Physical Therapy Assessment and Plan PT Assessment and Plan Clinical Impression Statement: Pt progressing well towards POC.  Gait training with SPC with 2 point pattern with minimal cueing required following initial cues.  Added 3D hip excursion to improve hip mobilty with gait.  Added SLS with 51" Rt LE first rep.  Pt able to demonstrate 5 sit to stands no HHA with no assistance required.  Improved stabilty with balance activities, min assistance required.   PT Plan: Continue with gait training with SPC and progress balance training to dynamic surfaces as ready.      Goals Home Exercise Program Pt/caregiver will Perform Home Exercise Program:  For increased strengthening PT Short Term Goals Time to Complete Short Term Goals: 2 weeks PT Short Term Goal 1: PT to be ambulating inside the house with her cane PT Short Term Goal 1 - Progress: Progressing toward goal PT Short Term Goal 2: Pt to be able to stand for 10 minutes to make a quick meal PT Short Term Goal 2 - Progress: Progressing toward goal PT Short Term Goal 3: Pt to be able to walk for 20 mintues without stopping for better health habits PT Long Term Goals Time to Complete Long Term Goals: 4 weeks PT Long Term Goal 1: Pt to be able to walk outside with a cane. PT Long Term Goal 2: Pt to be able to stand for 30 minutes to make a meal Long Term Goal 3: Pt to be able to walk for 30 mintues without stopping to be able to shop  Long Term Goal 4: Pt berg to be improved by 10 pts to decrease risk of falling. Long Term Goal 4 Progress: Progressing toward goal  Problem List Patient Active Problem List   Diagnosis Date Noted  . Right leg weakness 08/23/2013  . Unstable balance 08/23/2013  . Expected blood loss anemia 08/02/2013  . S/P right THA, AA 08/01/2013  . MUSCLE WEAKNESS (GENERALIZED) 11/29/2009  . FATIGUE 11/29/2009  . GENERALIZED PAIN 11/29/2009  . OSTEOARTHRITIS, HIP, RIGHT 09/16/2009  . HIP PAIN 03/14/2009  . SPINAL STENOSIS 03/14/2009  . SCOLIOSIS, LUMBAR SPINE 03/14/2009    PT - End of Session Equipment Utilized During Treatment: Gait belt Activity Tolerance: Patient tolerated treatment well General Behavior During Therapy: [facility]FL for tasks assessed/performed  GP    Juel BurrowCasey Jo Cockerham 08/31/2013, 4:43 PM

## 2013-09-02 ENCOUNTER — Other Ambulatory Visit: Payer: Self-pay | Admitting: Orthopedic Surgery

## 2013-09-04 ENCOUNTER — Ambulatory Visit (HOSPITAL_COMMUNITY)
Admission: RE | Admit: 2013-09-04 | Discharge: 2013-09-04 | Disposition: A | Discharge status: Home / Self Care | Payer: Medicare Other | Source: Ambulatory Visit | Attending: Internal Medicine | Admitting: Internal Medicine

## 2013-09-04 DIAGNOSIS — R2689 Other abnormalities of gait and mobility: Secondary | ICD-10-CM

## 2013-09-04 DIAGNOSIS — M25559 Pain in unspecified hip: Secondary | ICD-10-CM | POA: Insufficient documentation

## 2013-09-04 DIAGNOSIS — R29898 Other symptoms and signs involving the musculoskeletal system: Secondary | ICD-10-CM | POA: Diagnosis not present

## 2013-09-04 DIAGNOSIS — R269 Unspecified abnormalities of gait and mobility: Secondary | ICD-10-CM | POA: Insufficient documentation

## 2013-09-04 DIAGNOSIS — R262 Difficulty in walking, not elsewhere classified: Secondary | ICD-10-CM | POA: Insufficient documentation

## 2013-09-04 DIAGNOSIS — IMO0001 Reserved for inherently not codable concepts without codable children: Secondary | ICD-10-CM | POA: Diagnosis not present

## 2013-09-04 NOTE — Progress Notes (Signed)
Physical Therapy Treatment Patient Details  Name: [name] [name] MRN: [id number] Date of Birth: 12/09/1923  Today's Date: 09/04/2013 Time: 9147-82951302-1345 PT Time Calculation (min): 43 min Charge:  There ex [id number]; neuromuscular re ed [id number] Visit#: 4 of 12  Re-eval: 09/22/13    Authorization: medicare   Subjective: Symptoms/Limitations Symptoms: Pt states it is the first day she is walking with a cane.  Pain Assessment Currently in Pain?: No/denies    Exercise/Treatments     Balance Exercises Standing Tandem Stance: Eyes open;1 rep;30 secs SLS with Vectors: Solid surface;2 reps Balance Beam: forward, retro and sidestep x 2 RT Step Over Hurdles / Cones: 1 RT Numbers 1-15: Balance Beam;1 rep Marching Limitations: 5 second hold x 10 Heel Raises: Both;10 reps Sit to Stand: Standard surface Sit to Stand Limitations: 10 Other Standing Exercises: rockerboard both Rt/LT and anterior /posterior   Physical Therapy Assessment and Plan PT Assessment and Plan Clinical Impression Statement: Pt walks into department with cane.  Pt walking in between stations with no assistve device.  Pt still needs verbal cuing to look ahead instead of the floor.  Pt needed therapist facilitation for balance beam activities. PT Plan: Pt is doing very well ;  begin stair training; outside gt with cane and then progres to no assistive device.     Goals  progressing   Problem List Patient Active Problem List   Diagnosis Date Noted  . Right leg weakness 08/23/2013  . Unstable balance 08/23/2013  . Expected blood loss anemia 08/02/2013  . S/P right THA, AA 08/01/2013  . MUSCLE WEAKNESS (GENERALIZED) 11/29/2009  . FATIGUE 11/29/2009  . GENERALIZED PAIN 11/29/2009  . OSTEOARTHRITIS, HIP, RIGHT 09/16/2009  . HIP PAIN 03/14/2009  . SPINAL STENOSIS 03/14/2009  . SCOLIOSIS, LUMBAR SPINE 03/14/2009    PT - End of Session Equipment Utilized During Treatment: Gait belt  GP    [name] [name]  [name] [name] 09/04/2013, 2:04 PM

## 2013-09-05 ENCOUNTER — Ambulatory Visit (HOSPITAL_COMMUNITY)
Admission: RE | Admit: 2013-09-05 | Discharge: 2013-09-05 | Disposition: A | Discharge status: Home / Self Care | Payer: Medicare Other | Source: Ambulatory Visit | Attending: Internal Medicine | Admitting: Internal Medicine

## 2013-09-05 NOTE — Progress Notes (Signed)
Physical Therapy Treatment Patient Details  Name: [name] FaceDorothy A [name] MRN: [id number] Date of Birth: 05/03/1924  Today's Date: 09/05/2013 Time: 1305-1350 PT Time Calculation (min): 45 min  Visit#: 5 of 12  Re-eval: 09/22/13 Authorization: medicare  Authorization Visit#: 5 of 10  Charges:  therex [id number] (10'), gait  1316-1324 (8'), NMR 1325-1350 (25')  Subjective: Symptoms/Limitations Symptoms: Pt states she continues to use her cane.  States her Rt knee bothers her some.  Pain Assessment Currently in Pain?: Yes Pain Score: 3  Pain Location: Knee Pain Orientation: Right   Exercise/Treatments nustep level 2 hills #2 5 minutes UE/LE Standing Lateral Step Up: 10 reps;Step Height: 4";Both;Hand Hold: 1 Forward Step Up: 10 reps;Step Height: 4";Both;Hand Hold: 1 Stairs: in department 2RT with HR reciprocally Balance Exercises Standing Balance Beam: forward, retro and sidestep x 2 RT Marching: Limitations Marching Limitations: 1RT Sit to Stand: Standard surface Sit to Stand Limitations: 10 Other Standing Exercises: outdoor gait training up/down ramps/curbs across grass [facility]    Physical Therapy Assessment and Plan PT Assessment:  Progressed patient to outdoor ambulation using SPC.  Pt able to manipulate uneven surfaces including grass, negotiate ramps and curbs using SPC without LOB.  Pt able to negotiate curb and staircase in department using proper sequence . PT Plan: Pt is doing very well ;  begin stair training; outside gt with cane and then progres to no assistive device.      Problem List Patient Active Problem List   Diagnosis Date Noted  . Right leg weakness 08/23/2013  . Unstable balance 08/23/2013  . Expected blood loss anemia 08/02/2013  . S/P right THA, AA 08/01/2013  . MUSCLE WEAKNESS (GENERALIZED) 11/29/2009  . FATIGUE 11/29/2009  . GENERALIZED PAIN 11/29/2009  . OSTEOARTHRITIS, HIP, RIGHT 09/16/2009  . HIP PAIN 03/14/2009  . SPINAL STENOSIS 03/14/2009  .  SCOLIOSIS, LUMBAR SPINE 03/14/2009    PT - End of Session Equipment Utilized During Treatment: Gait belt Activity Tolerance: Patient tolerated treatment well   Lurena NidaAmy B Onda Kattner, PTA/CLT 09/05/2013, 2:30 PM

## 2013-09-07 ENCOUNTER — Ambulatory Visit (HOSPITAL_COMMUNITY)
Admission: RE | Admit: 2013-09-07 | Discharge: 2013-09-07 | Disposition: A | Discharge status: Home / Self Care | Payer: Medicare Other | Source: Ambulatory Visit | Attending: Internal Medicine | Admitting: Internal Medicine

## 2013-09-07 NOTE — Progress Notes (Signed)
Physical Therapy Treatment Patient Details  Name: Bud FaceDorothy A Roots MRN: [id number] Date of Birth: 07/05/1923  Today's Date: 09/07/2013 Time: 1300-1350 PT Time Calculation (min): 50 min Visit#: 6 of 12  Re-eval: 09/22/13 Authorization: medicare  Authorization Visit#: 6 of 10  Charges:  therex 1300-1315 (15'), NMR 1317-1329 (22'), gait 1340-1350 (10')  Subjective: Symptoms/Limitations Symptoms: Pt states her  RT hip and knee hurts worse at night.  Currently without pain.   Exercise/Treatments Aerobic Stationary Bike: nustep 8 minutes level 2 hills #2 Standing Side Lunges: Both;10 reps Lateral Step Up: 10 reps;Step Height: 4";Both;Hand Hold: 1 Forward Step Up: 10 reps;Step Height: 4";Both;Hand Hold: 0 Step Down: 10 reps;Step Height: 4";Both;Hand Hold: 1 Gait Training: outdoors without AD curbs, grass, ramps Balance Exercises Standing Balance Beam: forward, retro and sidestep x 2 RT Marching: Limitations Marching Limitations: 1RT Sit to Stand: Standard surface Sit to Stand Limitations: 10     Physical Therapy Assessment and Plan PT Assessment:  Progressed outdoor ambulation without AD. Pt able to complete without LOB or safety issues.  Improved stability noted today with balance activities.  Able to complete step ups without UE assist.  Progressing well.  PT Plan: Progress to stairwell reciprocally next visit.      Problem List Patient Active Problem List   Diagnosis Date Noted  . Right leg weakness 08/23/2013  . Unstable balance 08/23/2013  . Expected blood loss anemia 08/02/2013  . S/P right THA, AA 08/01/2013  . MUSCLE WEAKNESS (GENERALIZED) 11/29/2009  . FATIGUE 11/29/2009  . GENERALIZED PAIN 11/29/2009  . OSTEOARTHRITIS, HIP, RIGHT 09/16/2009  . HIP PAIN 03/14/2009  . SPINAL STENOSIS 03/14/2009  . SCOLIOSIS, LUMBAR SPINE 03/14/2009    PT - End of Session Equipment Utilized During Treatment: Gait belt Activity Tolerance: Patient tolerated treatment  well   Lurena NidaAmy B Frazier, PTA/CLT 09/07/2013, 1:59 PM

## 2013-09-11 ENCOUNTER — Ambulatory Visit (HOSPITAL_COMMUNITY)
Admission: RE | Admit: 2013-09-11 | Discharge: 2013-09-11 | Disposition: A | Discharge status: Home / Self Care | Payer: Medicare Other | Source: Ambulatory Visit | Attending: Internal Medicine | Admitting: Internal Medicine

## 2013-09-11 NOTE — Progress Notes (Signed)
Physical Therapy Treatment Patient Details  Name: [name] [name] MRN: [id number] Date of Birth: 04/25/1924  Today's Date: 09/11/2013 Time: [id number] PT Time Calculation (min): 42 min Charges: Therex x 20'(1301-1321) Gait x 8'(1422-1430) NMR x 08'([id number]'(1433-1443)  Visit#: 7 of 12  Re-eval: 09/22/13  Authorization: Medicare  Authorization Visit#: 7 of 10   Subjective: Symptoms/Limitations Symptoms: Pt reports no pain, only soreness. Pain Assessment Currently in Pain?: No/denies   Exercise/Treatments Aerobic Stationary Bike: nustep 8 minutes level 3 hills #3 to improve strenth and activity tolerance Standing Side Lunges: Both;10 reps Lateral Step Up: 10 reps;Step Height: 4";Both;Hand Hold: 1 Forward Step Up: 10 reps;Step Height: 4";Both;Hand Hold: 0 Step Down: 10 reps;Step Height: 4";Both;Hand Hold: 1 Functional Squat: 10 reps Gait Training: outdoors without AD curbs, grass, ramps  Balance Exercises Balance Beam: forward, retro and sidestep x 2 RT Heel Raises: 10 reps Toe Raise: 10 reps   Physical Therapy Assessment and Plan PT Assessment and Plan Clinical Impression Statement: Pt ambulates into clinic without AD. Pt completes strengthening activities with minimal difficulty after initial cueing and demo. Pt has most difficulty with activities on balance beam. Pt requires verbal cueing to improve focus with balance activities. Pt displays improved stability with ambulation outdoors. Pt utilizes one handrail and reciprocal pattern when negotiating stairs. Pt appears to tolerate session well.  PT Plan: Continue to progress functional strength, balance and gait mechanics per PT POC.      Problem List Patient Active Problem List   Diagnosis Date Noted  . Right leg weakness 08/23/2013  . Unstable balance 08/23/2013  . Expected blood loss anemia 08/02/2013  . S/P right THA, AA 08/01/2013  . MUSCLE WEAKNESS (GENERALIZED) 11/29/2009  . FATIGUE 11/29/2009  . GENERALIZED PAIN  11/29/2009  . OSTEOARTHRITIS, HIP, RIGHT 09/16/2009  . HIP PAIN 03/14/2009  . SPINAL STENOSIS 03/14/2009  . SCOLIOSIS, LUMBAR SPINE 03/14/2009    PT - End of Session Equipment Utilized During Treatment: Gait belt Activity Tolerance: Patient tolerated treatment well General Behavior During Therapy: [facility] for tasks assessed/performed  Seth Bakeebekah Virgie Chery, PTA 09/11/2013, 1:56 PM

## 2013-09-13 ENCOUNTER — Ambulatory Visit (HOSPITAL_COMMUNITY)
Admission: RE | Admit: 2013-09-13 | Discharge: 2013-09-13 | Disposition: A | Discharge status: Home / Self Care | Payer: Medicare Other | Source: Ambulatory Visit | Attending: Internal Medicine | Admitting: Internal Medicine

## 2013-09-13 NOTE — Progress Notes (Signed)
Physical Therapy Treatment Patient Details  Name: [name] [name] MRN: [id number] Date of Birth: 07/03/1923  Today's Date: 09/13/2013 Time: 1300-1345 PT Time Calculation (min): 45 min Visit#: 8 of 12  Re-eval: 09/22/13 Authorization: Medicare  Authorization Visit#: 8 of 10  Charges:  therex 1300-1315 (15'), NMR 1316-1345 (28')   Subjective: Symptoms/Limitations Symptoms: PT states she's not having any pain today.  States she has felt a little more unsteady the past couple of days and doesn't know why.   Pain Assessment Currently in Pain?: No/denies   Exercise/Treatments Aerobic Stationary Bike: nustep 10 minutes level 3 hills #3 to improve strenth and activity tolerance Standing Heel Raises: Limitations Heel Raises Limitations: heelwalk, toewalk 1RT each Side Lunges: Both;10 reps Lunge Walking - Round Trips: 1RT Balance Exercises Standing Balance Beam: forward, retro and sidestep x 2 RT Sit to Stand: Standard surface Sit to Stand Limitations:  10 times no UE's     Physical Therapy Assessment and Plan PT Assessment:  Pt able to recover balance independently with only minimal LOB with dynamic activities.  Pt able to complete STS without UE's now without difficulty.  Instructed patient to increase stride and practice heel toe gait with ambulation.  Pt able to demonstrate with correct techniques.   PT Plan: Continue to progress functional strength, balance and gait mechanics per PT POC.      Problem List Patient Active Problem List   Diagnosis Date Noted  . Right leg weakness 08/23/2013  . Unstable balance 08/23/2013  . Expected blood loss anemia 08/02/2013  . S/P right THA, AA 08/01/2013  . MUSCLE WEAKNESS (GENERALIZED) 11/29/2009  . FATIGUE 11/29/2009  . GENERALIZED PAIN 11/29/2009  . OSTEOARTHRITIS, HIP, RIGHT 09/16/2009  . HIP PAIN 03/14/2009  . SPINAL STENOSIS 03/14/2009  . SCOLIOSIS, LUMBAR SPINE 03/14/2009    PT - End of Session Equipment Utilized During  Treatment: Gait belt Activity Tolerance: Patient tolerated treatment well General Behavior During Therapy: [facility] for tasks assessed/performed     Lurena NidaAmy B Frazier, PTA/CLT 09/13/2013, 1:53 PM

## 2013-09-14 ENCOUNTER — Ambulatory Visit (HOSPITAL_COMMUNITY)
Admission: RE | Admit: 2013-09-14 | Discharge: 2013-09-14 | Disposition: A | Discharge status: Home / Self Care | Payer: Medicare Other | Source: Ambulatory Visit | Attending: Internal Medicine | Admitting: Internal Medicine

## 2013-09-14 NOTE — Progress Notes (Signed)
Physical Therapy Treatment Patient Details  Name: Bud FaceDorothy A Forton MRN: [id number] Date of Birth: 06/17/1923  Today's Date: 09/14/2013 Time: 1300-1345 PT Time Calculation (min): 45 min Visit#:  9 of  12  Re-eval:  09/18/13 prior to MD appointment on 5/20 Charges:  therex 1300-1314 (14'), gait 1315-1330 (15'), NMR 1332-1345 (13')  Subjective: Symptoms/Limitations Symptoms: PT states she's not having any pain today.  States she is more unsteady today, however does not appear to be unstable. Pain Assessment Currently in Pain?: No/denies   Exercise/Treatments Stretches Active Hamstring Stretch: 20 seconds Aerobic Stationary Bike: nustep 10 minutes level 3 hills #3 to improve strenth and activity tolerance Standing Heel Raises: Limitations Heel Raises Limitations: heelwalk, toewalk 1RT each Lunge Walking - Round Trips: 1RT Stairs: staircase outdoors 1 flight X 1 RT Gait Training: outdoors without AD curbs, grass, ramps Balance Exercises Standing Balance Beam: forward, retro and sidestep x 2 RT Marching: Limitations Marching Limitations: 1RT Sit to Stand: Standard surface Sit to Stand Limitations:  10 times no UE's     Physical Therapy Assessment and Plan PT Assessment:  Pt able to negotiate curbs, ramps and stairs without AD or use of handrail.  Pt is ambulating outdoors without AD, no LOB.  Pt continues to use SPC only for community ambulation.  Noted improvement in gait quality today with increased stride.  Continue to encourage arm swing with gait.  PT Plan: Re-evaluate and G-code update next visit.  MD appointment on Wednesday (5/20).     Problem List Patient Active Problem List   Diagnosis Date Noted  . Right leg weakness 08/23/2013  . Unstable balance 08/23/2013  . Expected blood loss anemia 08/02/2013  . S/P right THA, AA 08/01/2013  . MUSCLE WEAKNESS (GENERALIZED) 11/29/2009  . FATIGUE 11/29/2009  . GENERALIZED PAIN 11/29/2009  . OSTEOARTHRITIS, HIP, RIGHT  09/16/2009  . HIP PAIN 03/14/2009  . SPINAL STENOSIS 03/14/2009  . SCOLIOSIS, LUMBAR SPINE 03/14/2009     Lurena NidaAmy B Dellar Traber, PTA/CLT 09/14/2013, 5:48 PM

## 2013-09-18 ENCOUNTER — Ambulatory Visit (HOSPITAL_COMMUNITY)
Admission: RE | Admit: 2013-09-18 | Discharge: 2013-09-18 | Disposition: A | Discharge status: Home / Self Care | Payer: Medicare Other | Source: Ambulatory Visit | Attending: Internal Medicine | Admitting: Internal Medicine

## 2013-09-18 DIAGNOSIS — R29898 Other symptoms and signs involving the musculoskeletal system: Secondary | ICD-10-CM

## 2013-09-18 DIAGNOSIS — R2689 Other abnormalities of gait and mobility: Secondary | ICD-10-CM

## 2013-09-18 NOTE — Evaluation (Signed)
Physical Therapy RE-Evaluation  Patient Details  Name: [name] [name] MRN: [id number] Date of Birth: 10/25/1923  Today's Date: 09/18/2013 Time: 1300-1350 PT Time Calculation (min): 50 min              Visit#: 10 of 22  Re-eval: 10/18/13 Assessment Diagnosis: Rt anterior hip replacement Surgical Date: 08/01/13 Next MD Visit: Alvan Dame 09/20/2013 Charge:  Mm test 1300-1320; physical performance 1320-1329; there ex [id number]  Authorization: Medicare    Authorization Visit#: 10 of 20   Past Medical History:  Past Medical History  Diagnosis Date  . Arthritis   . Osteoporosis   . Frequency of urination   . Glaucoma    Past Surgical History:  Past Surgical History  Procedure Laterality Date  . Cholecystectomy  1980'S  . Back surgery  1967 / 1970'S    X 2  . Wrist fracture surgery    . Total hip arthroplasty Right 08/01/2013    Procedure: TOTAL RIGHT HIP ARTHROPLASTY ANTERIOR APPROACH;  Surgeon: Mauri Pole, MD;  Location: [facility];  Service: Orthopedics;  Laterality: Right;    Subjective Symptoms/Limitations Symptoms: Pt states that she has not had pain for a while.   How long can you sit comfortably?: Able to sit as long as she wants.  Was able to sit as  How long can you stand comfortably?: Able was to stand without holding on for four minutes-tested in department;  now four minutes  How long can you walk comfortably?: Pt was able  to walk ten minutes without stopping . not tested subjective. Pt states that she is not walking so she is not sure how long she can walk for but she feels it would be between 10-15 minutes.  Pain Assessment Currently in Pain?: No/denies  Precautions/Restrictions  Precautions Precautions: Anterior Hip     Sensation/Coordination/Flexibility/Functional Tests Functional Tests Functional Tests: foto 45 was 27  Assessment RLE Strength Right Hip Flexion:  (was 2+/5 now 2+/5) Right Hip Extension:  ( 4/5 was 2+/5) Right Hip ABduction: 3-/5 (was  2+/5) Right Hip ADduction: 5/5 (was 5/5) Right Knee Extension: 5/5 (was 5/5) Right Ankle Dorsiflexion:  (was 3+/5 now 4/5) LLE Strength Left Hip Flexion: 5/5 Left Hip Extension: 5/5 (was 3-/5) Left Hip ABduction: 5/5 Left Knee Flexion: 5/5 Left Knee Extension: 5/5 Left Ankle Dorsiflexion: 5/5  Exercise/Treatments Mobility/Balance  Berg Balance Test Sit to Stand: Able to stand without using hands and stabilize independently Standing Unsupported: Able to stand safely 2 minutes Sitting with Back Unsupported but Feet Supported on Floor or Stool: Able to sit safely and securely 2 minutes Stand to Sit: Controls descent by using hands Transfers: Able to transfer safely, minor use of hands Standing Unsupported with Eyes Closed: Able to stand 10 seconds safely Standing Ubsupported with Feet Together: Able to place feet together independently and stand 1 minute safely From Standing, Reach Forward with Outstretched Arm: Can reach confidently >25 cm (10") From Standing Position, Pick up Object from Floor: Able to pick up shoe safely and easily From Standing Position, Turn to Look Behind Over each Shoulder: Looks behind one side only/other side shows less weight shift Turn 360 Degrees: Able to turn 360 degrees safely but slowly Standing Unsupported, Alternately Place Feet on Step/Stool: Able to stand independently and safely and complete 8 steps in 20 seconds Standing Unsupported, One Foot in Front: Able to plae foot ahead of the other independently and hold 30 seconds Standing on One Leg: Able to lift leg independently and hold 5-10  seconds Total Score: 50  Was 42    Aerobic Stationary Bike: nustep 10 minutes level 3 hills #3 to improve strenth and activity tolerance      Supine Straight Leg Raises: 10 reps;Limitations Straight Leg Raises Limitations: with knee bent Sidelying Hip ABduction: Left;10 reps      Physical Therapy Assessment and Plan PT Assessment and Plan Clinical  Impression Statement: Pt reassessed today with improved balance and all strength of Lt LE improved except hip flexion which remains at 2+/5.  Pt hip mm have improved but still are below normal with pt still using cane if she is going to have to walk any distance beyond five minutes.  Pt will continue to benefit from skilled PT to achieve all goals.   PT Plan: Concentrate on balance, TM for gait as well as hip flexion and abduction strengthening.     Goals Home Exercise Program Pt/caregiver will Perform Home Exercise Program: For increased strengthening PT Goal: Perform Home Exercise Program - Progress: Met PT Short Term Goals PT Short Term Goal 1: PT to be ambulating inside the house with her cane PT Short Term Goal 1 - Progress: Met PT Short Term Goal 2: Pt to be able to stand for 10 minutes to make a quick meal PT Short Term Goal 2 - Progress: Met PT Short Term Goal 3: Pt to be able to walk for 20 mintues without stopping for better health habits PT Short Term Goal 3 - Progress: Not met (Pt is not walking) PT Long Term Goals Time to Complete Long Term Goals: 4 weeks PT Long Term Goal 1: Pt to be able to walk outside with a cane. PT Long Term Goal 1 - Progress: Met PT Long Term Goal 2: Pt to be able to stand for 30 minutes to make a meal PT Long Term Goal 2 - Progress: Met Long Term Goal 3: Pt to be able to walk for 30 mintues without stopping to be able to shop  (Pt is not walking) Long Term Goal 3 Progress: Not met Long Term Goal 4: Pt berg to be improved by 10 pts to decrease risk of falling. Long Term Goal 4 Progress: Met  Problem List Patient Active Problem List   Diagnosis Date Noted  . Right leg weakness 08/23/2013  . Unstable balance 08/23/2013  . Expected blood loss anemia 08/02/2013  . S/P right THA, AA 08/01/2013  . MUSCLE WEAKNESS (GENERALIZED) 11/29/2009  . FATIGUE 11/29/2009  . GENERALIZED PAIN 11/29/2009  . OSTEOARTHRITIS, HIP, RIGHT 09/16/2009  . HIP PAIN  03/14/2009  . SPINAL STENOSIS 03/14/2009  . SCOLIOSIS, LUMBAR SPINE 03/14/2009    PT - End of Session Equipment Utilized During Treatment: Gait belt Activity Tolerance: Patient tolerated treatment well PT Plan of Care PT Home Exercise Plan: urged to complete HEP given initially as this is still appropriate.  GP Functional Limitation: Mobility: Walking and moving around Mobility: Walking and Moving Around Current Status [phone]): At least 40 percent but less than 60 percent impaired, limited or restricted Mobility: Walking and Moving Around Goal Status [phone]): At least 20 percent but less than 40 percent impaired, limited or restricted  Leeroy Cha 09/18/2013, 2:26 PM  Physician Documentation Your signature is required to indicate approval of the treatment plan as stated above.  Please sign and either send electronically or make a copy of this report for your files and return this physician signed original.   Please mark one 1.__approve of plan  2. ___approve of  plan with the following conditions.   ______________________________                                                          _____________________ Physician Signature                                                                                                             Date

## 2013-09-19 ENCOUNTER — Ambulatory Visit (HOSPITAL_COMMUNITY): Payer: Medicare Other | Admitting: Physical Therapy

## 2013-09-20 DIAGNOSIS — Z9660 Presence of unspecified orthopedic joint implant: Secondary | ICD-10-CM | POA: Diagnosis not present

## 2013-09-20 DIAGNOSIS — Z471 Aftercare following joint replacement surgery: Secondary | ICD-10-CM | POA: Diagnosis not present

## 2013-09-21 ENCOUNTER — Ambulatory Visit (HOSPITAL_COMMUNITY)
Admission: RE | Admit: 2013-09-21 | Discharge: 2013-09-21 | Disposition: A | Discharge status: Home / Self Care | Payer: Medicare Other | Source: Ambulatory Visit | Attending: Internal Medicine | Admitting: Internal Medicine

## 2013-09-21 DIAGNOSIS — R2689 Other abnormalities of gait and mobility: Secondary | ICD-10-CM

## 2013-09-21 DIAGNOSIS — R29898 Other symptoms and signs involving the musculoskeletal system: Secondary | ICD-10-CM

## 2013-09-21 NOTE — Progress Notes (Signed)
Physical Therapy Treatment Patient Details  Name: [name] [name] MRN: [id number] Date of Birth: 04/29/1924  Today's Date: 09/21/2013 Time: 1300-1345 PT Time Calculation (min): 45 min Charge: Gait 1300-1315, NMR 1315-1330 TE [id number]   Visit#: 11 of 22  Re-eval: 10/18/13 Assessment Diagnosis: Rt anterior hip replacement Surgical Date: 08/01/13 Next MD Visit: [name] [name]   Authorization: Medicare  Authorization Time Period:    Authorization Visit#: 11 of 20   Subjective: Symptoms/Limitations Symptoms: Pt stated MD very happy with her progress,   Pain free today Pain Assessment Currently in Pain?: No/denies  Precautions/Restrictions  Precautions Precautions: Anterior Hip  Exercise/Treatments Aerobic Tread Mill: Gait training 1.2mph x 10 min with cueing for posture, heel to toe pattern and to increase stride length 10 minutes Standing SLS with Vectors: Bil LE 3x 5" on airex Gait Training: Gait training 2RT around dept Other Standing Knee Exercises: Trial with abduction against wall with heel on wall, 5x wtih max cueing to reduce compensation   Balance Exercises Standing Balance Beam: forward, retro and sidestep x 2 RT Marching: Foam;10 reps;Limitations Marching Limitations: 5" holds on airex no HHA alternating   Physical Therapy Assessment and Plan PT Assessment and Plan Clinical Impression Statement: Session focus on improving gait mechanics with no AD and strengthening activties on dynamic surface to improve balance.  Improved stability on balance beam with min guard required.  Hip strength is improving, pt was able to complete SLR with knee straight today. PT Plan: Continue focus on balance, gait training on TM and strengtening to hip flexion and abduction.    Goals Home Exercise Program Pt/caregiver will Perform Home Exercise Program: For increased strengthening PT Short Term Goals PT Short Term Goal 1: PT to be ambulating inside the house with her cane PT Short Term  Goal 2: Pt to be able to stand for 10 minutes to make a quick meal PT Short Term Goal 3: Pt to be able to walk for 20 mintues without stopping for better health habits PT Short Term Goal 3 - Progress: Progressing toward goal PT Long Term Goals Time to Complete Long Term Goals: 4 weeks PT Long Term Goal 1: Pt to be able to walk outside with a cane. PT Long Term Goal 2: Pt to be able to stand for 30 minutes to make a meal Long Term Goal 3: Pt to be able to walk for 30 mintues without stopping to be able to shop  (Pt is not walking) Long Term Goal 3 Progress: Progressing toward goal Long Term Goal 4: Pt berg to be improved by 10 pts to decrease risk of falling.  Problem List Patient Active Problem List   Diagnosis Date Noted  . Right leg weakness 08/23/2013  . Unstable balance 08/23/2013  . Expected blood loss anemia 08/02/2013  . S/P right THA, AA 08/01/2013  . MUSCLE WEAKNESS (GENERALIZED) 11/29/2009  . FATIGUE 11/29/2009  . GENERALIZED PAIN 11/29/2009  . OSTEOARTHRITIS, HIP, RIGHT 09/16/2009  . HIP PAIN 03/14/2009  . SPINAL STENOSIS 03/14/2009  . SCOLIOSIS, LUMBAR SPINE 03/14/2009    PT - End of Session Equipment Utilized During Treatment: Gait belt Activity Tolerance: Patient tolerated treatment well  GP    [name] [name] 09/21/2013, 4:17 PM

## 2013-09-27 ENCOUNTER — Inpatient Hospital Stay (HOSPITAL_COMMUNITY): Admission: RE | Admit: 2013-09-27 | Payer: Medicare Other | Source: Ambulatory Visit | Admitting: Orthopedic Surgery

## 2013-09-27 ENCOUNTER — Encounter (HOSPITAL_COMMUNITY): Admission: RE | Payer: Self-pay | Source: Ambulatory Visit

## 2013-09-27 ENCOUNTER — Ambulatory Visit (HOSPITAL_COMMUNITY)
Admission: RE | Admit: 2013-09-27 | Discharge: 2013-09-27 | Disposition: A | Discharge status: Home / Self Care | Payer: Medicare Other | Source: Ambulatory Visit | Attending: Internal Medicine | Admitting: Internal Medicine

## 2013-09-27 DIAGNOSIS — R2689 Other abnormalities of gait and mobility: Secondary | ICD-10-CM

## 2013-09-27 DIAGNOSIS — R29898 Other symptoms and signs involving the musculoskeletal system: Secondary | ICD-10-CM

## 2013-09-27 SURGERY — ARTHROPLASTY, HIP, TOTAL, ANTERIOR APPROACH
Anesthesia: Choice | Site: Hip | Laterality: Right

## 2013-09-27 NOTE — Progress Notes (Signed)
Physical Therapy Treatment Patient Details  Name: [name] [name] MRN: [id number] Date of Birth: July 05, 1923  Today's Date: 09/27/2013 Time: 1100-1145 PT Time Calculation (min): 45 min Charge: Gait 1100-1110;,TE 1110-1145  Visit#: 12 of 22  Re-eval: 10/18/13 Assessment Diagnosis: Rt anterior hip replacement Surgical Date: 08/01/13 Next MD Visit: [name]   Authorization: Medicare  Authorization Time Period:    Authorization Visit#: 12 of 20   Subjective: Symptoms/Limitations Symptoms: Pt stated she was pain free, pt reported she spent 8 hours planting veggies in her garden was tired but no pain in hip Pain Assessment Currently in Pain?: No/denies  Precautions/Restrictions  Precautions Precautions: Anterior Hip  Exercise/Treatments Aerobic Tread Mill: Gait training 1.3 mph x 10 min with cueing for posture, heel to toe pattern and to increase stride length 10 minutes Standing SLS: L 25", Lt 15" max of 3 SLS with Vectors: Rt LE on airex 5x 5" Other Standing Knee Exercises: Abduction with heel on wall with therapist facilitation 10x Other Standing Knee Exercises: Sidestepping with blue tband Balance Exercises Standing Balance Beam: forward, retro and sidestep x 2 RT Sidestepping: 2 reps;Theraband Theraband Level (Sidestepping): Level 4 (Blue) Step Over Hurdles / Cones: 12 in hurdles on balance beam with 5" high march holds     Physical Therapy Assessment and Plan PT Assessment and Plan Clinical Impression Statement: Pt wearing tennis shoes this session with noted vast improvements with gait training.  Pt able to progress to LE strengthening on dynamic surfaces to improve balance and stability, min guard required with activilty on balance beam.  Verbal cueing to improve spatial awareness to assist with balance.  Improving glut med strength, pt able to complete abduction against wall this session wtht therapist faciliation for form and technique.  Added sidestepping with theraband  for glut med strengthening.   PT Plan: Continue focus on balance, gait training on TM and strengtening to hip flexion and abduction.    Goals Home Exercise Program Pt/caregiver will Perform Home Exercise Program: For increased strengthening PT Short Term Goals PT Short Term Goal 1: PT to be ambulating inside the house with her cane PT Short Term Goal 2: Pt to be able to stand for 10 minutes to make a quick meal PT Short Term Goal 3: Pt to be able to walk for 20 mintues without stopping for better health habits PT Short Term Goal 3 - Progress: Progressing toward goal PT Long Term Goals Time to Complete Long Term Goals: 4 weeks PT Long Term Goal 1: Pt to be able to walk outside with a cane. PT Long Term Goal 2: Pt to be able to stand for 30 minutes to make a meal Long Term Goal 3: Pt to be able to walk for 30 mintues without stopping to be able to shop  (Pt is not walking) Long Term Goal 4: Pt berg to be improved by 10 pts to decrease risk of falling.  Problem List Patient Active Problem List   Diagnosis Date Noted  . Right leg weakness 08/23/2013  . Unstable balance 08/23/2013  . Expected blood loss anemia 08/02/2013  . S/P right THA, AA 08/01/2013  . MUSCLE WEAKNESS (GENERALIZED) 11/29/2009  . FATIGUE 11/29/2009  . GENERALIZED PAIN 11/29/2009  . OSTEOARTHRITIS, HIP, RIGHT 09/16/2009  . HIP PAIN 03/14/2009  . SPINAL STENOSIS 03/14/2009  . SCOLIOSIS, LUMBAR SPINE 03/14/2009    PT - End of Session Equipment Utilized During Treatment: Gait belt Activity Tolerance: Patient tolerated treatment well General Behavior During Therapy: [facility] for tasks  assessed/performed  GP    [name] [name] 09/27/2013, 12:07 PM

## 2013-09-28 ENCOUNTER — Ambulatory Visit (HOSPITAL_COMMUNITY): Payer: Medicare Other | Admitting: *Deleted

## 2013-10-02 ENCOUNTER — Ambulatory Visit (HOSPITAL_COMMUNITY): Payer: Medicare Other

## 2013-10-02 ENCOUNTER — Ambulatory Visit (HOSPITAL_COMMUNITY): Payer: Medicare Other | Admitting: Physical Therapy

## 2013-10-04 ENCOUNTER — Ambulatory Visit (HOSPITAL_COMMUNITY): Payer: Medicare Other

## 2013-10-04 ENCOUNTER — Ambulatory Visit (HOSPITAL_COMMUNITY)
Admission: RE | Admit: 2013-10-04 | Discharge: 2013-10-04 | Disposition: A | Discharge status: Home / Self Care | Payer: Medicare Other | Source: Ambulatory Visit | Attending: Internal Medicine | Admitting: Internal Medicine

## 2013-10-04 DIAGNOSIS — M25559 Pain in unspecified hip: Secondary | ICD-10-CM | POA: Insufficient documentation

## 2013-10-04 DIAGNOSIS — R262 Difficulty in walking, not elsewhere classified: Secondary | ICD-10-CM | POA: Insufficient documentation

## 2013-10-04 DIAGNOSIS — R269 Unspecified abnormalities of gait and mobility: Secondary | ICD-10-CM | POA: Insufficient documentation

## 2013-10-04 DIAGNOSIS — R2689 Other abnormalities of gait and mobility: Secondary | ICD-10-CM

## 2013-10-04 DIAGNOSIS — IMO0001 Reserved for inherently not codable concepts without codable children: Secondary | ICD-10-CM | POA: Insufficient documentation

## 2013-10-04 DIAGNOSIS — R29898 Other symptoms and signs involving the musculoskeletal system: Secondary | ICD-10-CM

## 2013-10-04 NOTE — Evaluation (Signed)
Physical Therapy Reassessment Patient Details  Name: [name] [name] MRN: [id number] Date of Birth: [birth year]  Today's Date: 10/04/2013 Time: 4431-5400 PT Time Calculation (min): 44 min There ex 1430-1445; mm test 1445-1500; functional test 1500-1513             Visit#: 13 of 22  Re-eval: 10/18/13 Assessment Diagnosis: Rt anterior hip replacement Surgical Date: 08/01/13 Next MD Visit: Alvan Dame   Authorization: Medicare    Authorization Visit#: 50 of 59   Past Medical History:  Past Medical History  Diagnosis Date  . Arthritis   . Osteoporosis   . Frequency of urination   . Glaucoma    Past Surgical History:  Past Surgical History  Procedure Laterality Date  . Cholecystectomy  1980'S  . Back surgery  1967 / 1970'S    X 2  . Wrist fracture surgery    . Total hip arthroplasty Right 08/01/2013    Procedure: TOTAL RIGHT HIP ARTHROPLASTY ANTERIOR APPROACH;  Surgeon: Mauri Pole, MD;  Location: [facility];  Service: Orthopedics;  Laterality: Right;    Subjective Symptoms/Limitations Symptoms: Patient does not report difficulty completing daily activities at home.  Reports she is "a new person".  Pain Assessment Currently in Pain?: No/denies Pain Score: 0-No pain  Precautions/Restrictions  Precautions Precautions: Anterior Hip     Sensation/Coordination/Flexibility/Functional Tests Functional Tests Functional Tests: foto initial 27  current 73  Assessment RLE Strength Right Hip Flexion: 5/5 (Was 2+/5) Right Hip Extension: 5/5 (Was 4/5) Right Hip ABduction: 5/5 (Was 3-/5) Right Hip ADduction: 5/5 (Was 5/5) Right Knee Flexion: 5/5 Right Knee Extension: 5/5 Right Ankle Dorsiflexion: 5/5 (Was 4/5)  Exercise/Treatments Mobility/Balance  Berg Balance Test Sit to Stand: Able to stand without using hands and stabilize independently Standing Unsupported: Able to stand safely 2 minutes Sitting with Back Unsupported but Feet Supported on Floor or Stool: Able to sit safely  and securely 2 minutes Stand to Sit: Sits safely with minimal use of hands Transfers: Able to transfer safely, minor use of hands Standing Unsupported with Eyes Closed: Able to stand 10 seconds safely Standing Ubsupported with Feet Together: Able to place feet together independently and stand 1 minute safely From Standing, Reach Forward with Outstretched Arm: Can reach confidently >25 cm (10") From Standing Position, Pick up Object from Floor: Able to pick up shoe safely and easily From Standing Position, Turn to Look Behind Over each Shoulder: Looks behind one side only/other side shows less weight shift Turn 360 Degrees: Able to turn 360 degrees safely in 4 seconds or less Standing Unsupported, Alternately Place Feet on Step/Stool: Able to stand independently and safely and complete 8 steps in 20 seconds Standing Unsupported, One Foot in Front: Able to place foot tandem independently and hold 30 seconds Standing on One Leg: Able to lift leg independently and hold 5-10 seconds Total Score: 54 was 42 at initial evaluation  Pt completed 2 RT on balance beam for tandem and retro gt; ambulated in hall to see how long pt felt comfortable in order to start a home walking program x 10 minutes.    Physical Therapy Assessment and Plan PT Assessment and Plan Clinical Impression Statement: Pt states she is doing everything she wants to around the house therefore evaluating therapist reassessed pt to see if pt was ready for discharge.  Pt is able to be discharged as she no longe is in need of skilled therapy. PT Plan: discharge pt    Goals Home Exercise Program Pt/caregiver will Perform Home  Exercise Program: For increased strengthening PT Short Term Goals PT Short Term Goal 1: PT to be ambulating inside the house with her cane PT Short Term Goal 1 - Progress: Met PT Short Term Goal 2: Pt to be able to stand for 10 minutes to make a quick meal PT Short Term Goal 2 - Progress: Met PT Short Term  Goal 3: Pt to be able to walk for 20 mintues without stopping for better health habits PT Short Term Goal 3 - Progress: Met PT Long Term Goals Time to Complete Long Term Goals: 4 weeks PT Long Term Goal 1: Pt to be able to walk outside with a cane. PT Long Term Goal 1 - Progress: Met PT Long Term Goal 2: Pt to be able to stand for 30 minutes to make a meal PT Long Term Goal 2 - Progress: Met Long Term Goal 3: Pt to be able to walk for 30 mintues without stopping to be able to shop  Long Term Goal 3 Progress: Met Long Term Goal 4: Pt berg to be improved by 10 pts to decrease risk of falling. Long Term Goal 4 Progress: Met  Problem List Patient Active Problem List   Diagnosis Date Noted  . Right leg weakness 08/23/2013  . Unstable balance 08/23/2013  . Expected blood loss anemia 08/02/2013  . S/P right THA, AA 08/01/2013  . MUSCLE WEAKNESS (GENERALIZED) 11/29/2009  . FATIGUE 11/29/2009  . GENERALIZED PAIN 11/29/2009  . OSTEOARTHRITIS, HIP, RIGHT 09/16/2009  . HIP PAIN 03/14/2009  . SPINAL STENOSIS 03/14/2009  . SCOLIOSIS, LUMBAR SPINE 03/14/2009    PT - End of Session Equipment Utilized During Treatment: Gait belt  GP Functional Assessment Tool Used: foto Functional Limitation: Mobility: Walking and moving around Mobility: Walking and Moving Around Goal Status [phone]): At least 20 percent but less than 40 percent impaired, limited or restricted Mobility: Walking and Moving Around Discharge Status [phone]): At least 20 percent but less than 40 percent impaired, limited or restricted  Leeroy Cha 10/04/2013, 3:18 PM  Physician Documentation Your signature is required to indicate approval of the treatment plan as stated above.  Please sign and either send electronically or make a copy of this report for your files and return this physician signed original.   Please mark one 1.__approve of plan  2. ___approve of plan with the following  conditions.   ______________________________                                                          _____________________ Physician Signature                                                                                                             Date

## 2013-10-05 ENCOUNTER — Inpatient Hospital Stay (HOSPITAL_COMMUNITY): Admission: RE | Admit: 2013-10-05 | Payer: Medicare Other | Source: Ambulatory Visit | Admitting: Physical Therapy

## 2013-10-06 ENCOUNTER — Ambulatory Visit (HOSPITAL_COMMUNITY): Payer: Medicare Other

## 2013-10-09 ENCOUNTER — Ambulatory Visit (HOSPITAL_COMMUNITY): Payer: Medicare Other | Admitting: Physical Therapy

## 2013-10-11 ENCOUNTER — Ambulatory Visit (HOSPITAL_COMMUNITY): Payer: Medicare Other | Admitting: Physical Therapy

## 2013-10-12 ENCOUNTER — Ambulatory Visit (HOSPITAL_COMMUNITY): Payer: Medicare Other | Admitting: Physical Therapy

## 2013-10-13 ENCOUNTER — Ambulatory Visit (HOSPITAL_COMMUNITY): Payer: Medicare Other | Admitting: Physical Therapy

## 2013-10-16 ENCOUNTER — Ambulatory Visit (HOSPITAL_COMMUNITY): Payer: Medicare Other | Admitting: Physical Therapy

## 2013-10-17 DIAGNOSIS — H409 Unspecified glaucoma: Secondary | ICD-10-CM | POA: Diagnosis not present

## 2013-10-17 DIAGNOSIS — H4011X0 Primary open-angle glaucoma, stage unspecified: Secondary | ICD-10-CM | POA: Diagnosis not present

## 2013-10-18 ENCOUNTER — Ambulatory Visit (HOSPITAL_COMMUNITY): Payer: Medicare Other

## 2013-10-19 ENCOUNTER — Ambulatory Visit (HOSPITAL_COMMUNITY): Payer: Medicare Other | Admitting: Physical Therapy

## 2013-10-20 ENCOUNTER — Ambulatory Visit (HOSPITAL_COMMUNITY): Payer: Medicare Other | Admitting: Physical Therapy

## 2013-10-23 ENCOUNTER — Ambulatory Visit (HOSPITAL_COMMUNITY): Payer: Medicare Other | Admitting: Physical Therapy

## 2013-10-25 ENCOUNTER — Ambulatory Visit (HOSPITAL_COMMUNITY): Payer: Medicare Other

## 2013-10-27 ENCOUNTER — Ambulatory Visit (HOSPITAL_COMMUNITY): Payer: Medicare Other | Admitting: Physical Therapy

## 2013-12-05 DIAGNOSIS — H4011X0 Primary open-angle glaucoma, stage unspecified: Secondary | ICD-10-CM | POA: Diagnosis not present

## 2013-12-05 DIAGNOSIS — H409 Unspecified glaucoma: Secondary | ICD-10-CM | POA: Diagnosis not present

## 2014-01-30 DIAGNOSIS — Z23 Encounter for immunization: Secondary | ICD-10-CM | POA: Diagnosis not present

## 2014-03-26 ENCOUNTER — Other Ambulatory Visit (HOSPITAL_COMMUNITY): Payer: Self-pay | Admitting: Internal Medicine

## 2014-03-26 ENCOUNTER — Ambulatory Visit (HOSPITAL_COMMUNITY)
Admission: RE | Admit: 2014-03-26 | Discharge: 2014-03-26 | Disposition: A | Discharge status: Home / Self Care | Payer: Medicare Other | Source: Ambulatory Visit | Attending: Internal Medicine | Admitting: Internal Medicine

## 2014-03-26 DIAGNOSIS — M542 Cervicalgia: Secondary | ICD-10-CM | POA: Diagnosis not present

## 2014-03-26 DIAGNOSIS — M5032 Other cervical disc degeneration, mid-cervical region: Secondary | ICD-10-CM | POA: Diagnosis not present

## 2014-03-26 DIAGNOSIS — M545 Low back pain, unspecified: Secondary | ICD-10-CM

## 2014-03-26 DIAGNOSIS — M5136 Other intervertebral disc degeneration, lumbar region: Secondary | ICD-10-CM | POA: Diagnosis not present

## 2014-03-26 DIAGNOSIS — M419 Scoliosis, unspecified: Secondary | ICD-10-CM | POA: Diagnosis not present

## 2014-03-26 DIAGNOSIS — M4802 Spinal stenosis, cervical region: Secondary | ICD-10-CM | POA: Diagnosis not present

## 2014-04-17 DIAGNOSIS — H40013 Open angle with borderline findings, low risk, bilateral: Secondary | ICD-10-CM | POA: Diagnosis not present

## 2014-05-08 DIAGNOSIS — L84 Corns and callosities: Secondary | ICD-10-CM | POA: Diagnosis not present

## 2014-05-14 DIAGNOSIS — Z96641 Presence of right artificial hip joint: Secondary | ICD-10-CM | POA: Diagnosis not present

## 2014-05-14 DIAGNOSIS — M25571 Pain in right ankle and joints of right foot: Secondary | ICD-10-CM | POA: Diagnosis not present

## 2014-05-22 DIAGNOSIS — M2040 Other hammer toe(s) (acquired), unspecified foot: Secondary | ICD-10-CM | POA: Diagnosis not present

## 2014-05-22 DIAGNOSIS — L851 Acquired keratosis [keratoderma] palmaris et plantaris: Secondary | ICD-10-CM | POA: Diagnosis not present

## 2014-05-22 DIAGNOSIS — L603 Nail dystrophy: Secondary | ICD-10-CM | POA: Diagnosis not present

## 2014-05-22 DIAGNOSIS — M2010 Hallux valgus (acquired), unspecified foot: Secondary | ICD-10-CM | POA: Diagnosis not present

## 2014-06-06 DIAGNOSIS — Z961 Presence of intraocular lens: Secondary | ICD-10-CM | POA: Diagnosis not present

## 2014-06-06 DIAGNOSIS — H4011X2 Primary open-angle glaucoma, moderate stage: Secondary | ICD-10-CM | POA: Diagnosis not present

## 2014-06-06 DIAGNOSIS — H4011X1 Primary open-angle glaucoma, mild stage: Secondary | ICD-10-CM | POA: Diagnosis not present

## 2014-07-04 DIAGNOSIS — M25571 Pain in right ankle and joints of right foot: Secondary | ICD-10-CM | POA: Diagnosis not present

## 2014-07-11 DIAGNOSIS — J069 Acute upper respiratory infection, unspecified: Secondary | ICD-10-CM | POA: Diagnosis not present

## 2014-07-11 DIAGNOSIS — N390 Urinary tract infection, site not specified: Secondary | ICD-10-CM | POA: Diagnosis not present

## 2014-07-11 DIAGNOSIS — N3000 Acute cystitis without hematuria: Secondary | ICD-10-CM | POA: Diagnosis not present

## 2014-07-17 DIAGNOSIS — L851 Acquired keratosis [keratoderma] palmaris et plantaris: Secondary | ICD-10-CM | POA: Diagnosis not present

## 2014-07-17 DIAGNOSIS — Q828 Other specified congenital malformations of skin: Secondary | ICD-10-CM | POA: Diagnosis not present

## 2014-07-17 DIAGNOSIS — M2040 Other hammer toe(s) (acquired), unspecified foot: Secondary | ICD-10-CM | POA: Diagnosis not present

## 2014-07-17 DIAGNOSIS — M2010 Hallux valgus (acquired), unspecified foot: Secondary | ICD-10-CM | POA: Diagnosis not present

## 2014-08-03 DIAGNOSIS — R350 Frequency of micturition: Secondary | ICD-10-CM | POA: Diagnosis not present

## 2014-08-03 DIAGNOSIS — R300 Dysuria: Secondary | ICD-10-CM | POA: Diagnosis not present

## 2014-09-04 ENCOUNTER — Ambulatory Visit: Payer: Medicare Other | Admitting: Urology

## 2014-09-17 DIAGNOSIS — J069 Acute upper respiratory infection, unspecified: Secondary | ICD-10-CM | POA: Diagnosis not present

## 2014-12-12 DIAGNOSIS — M542 Cervicalgia: Secondary | ICD-10-CM | POA: Diagnosis not present

## 2014-12-12 DIAGNOSIS — M5412 Radiculopathy, cervical region: Secondary | ICD-10-CM | POA: Diagnosis not present

## 2014-12-12 DIAGNOSIS — M47812 Spondylosis without myelopathy or radiculopathy, cervical region: Secondary | ICD-10-CM | POA: Diagnosis not present

## 2014-12-12 DIAGNOSIS — M8580 Other specified disorders of bone density and structure, unspecified site: Secondary | ICD-10-CM | POA: Diagnosis not present

## 2015-01-15 DIAGNOSIS — H4011X2 Primary open-angle glaucoma, moderate stage: Secondary | ICD-10-CM | POA: Diagnosis not present

## 2015-01-15 DIAGNOSIS — Z961 Presence of intraocular lens: Secondary | ICD-10-CM | POA: Diagnosis not present

## 2015-02-18 ENCOUNTER — Other Ambulatory Visit (HOSPITAL_COMMUNITY): Payer: Self-pay | Admitting: Internal Medicine

## 2015-02-18 ENCOUNTER — Ambulatory Visit (HOSPITAL_COMMUNITY)
Admission: RE | Admit: 2015-02-18 | Discharge: 2015-02-18 | Disposition: A | Discharge status: Home / Self Care | Payer: Medicare Other | Source: Ambulatory Visit | Attending: Internal Medicine | Admitting: Internal Medicine

## 2015-02-18 DIAGNOSIS — M5134 Other intervertebral disc degeneration, thoracic region: Secondary | ICD-10-CM | POA: Insufficient documentation

## 2015-02-18 DIAGNOSIS — M549 Dorsalgia, unspecified: Secondary | ICD-10-CM | POA: Diagnosis present

## 2015-02-22 DIAGNOSIS — M542 Cervicalgia: Secondary | ICD-10-CM | POA: Diagnosis not present

## 2015-08-07 DIAGNOSIS — M25552 Pain in left hip: Secondary | ICD-10-CM | POA: Diagnosis not present

## 2015-08-29 DIAGNOSIS — M25552 Pain in left hip: Secondary | ICD-10-CM | POA: Diagnosis not present

## 2015-09-03 DIAGNOSIS — H401131 Primary open-angle glaucoma, bilateral, mild stage: Secondary | ICD-10-CM | POA: Diagnosis not present

## 2015-09-13 DIAGNOSIS — M1612 Unilateral primary osteoarthritis, left hip: Secondary | ICD-10-CM | POA: Diagnosis not present

## 2015-09-23 DIAGNOSIS — M25552 Pain in left hip: Secondary | ICD-10-CM | POA: Diagnosis not present

## 2015-09-23 DIAGNOSIS — Z01811 Encounter for preprocedural respiratory examination: Secondary | ICD-10-CM | POA: Diagnosis not present

## 2015-09-23 DIAGNOSIS — Z01810 Encounter for preprocedural cardiovascular examination: Secondary | ICD-10-CM | POA: Diagnosis not present

## 2015-10-01 DIAGNOSIS — M79671 Pain in right foot: Secondary | ICD-10-CM | POA: Diagnosis not present

## 2015-10-02 NOTE — Patient Instructions (Addendum)
[name] [name]  10/02/2015   Your procedure is scheduled on: 10/15/15  Report [facility]  [facility]trance [facility]  elevators to 3rd floor to  Short Stay Center at  1:00 pm  Call this number if you have problems the morning of surgery [phone]   Remember: ONLY 1 PERSON MAY GO WITH YOU TO SHORT STAY TO GET  READY MORNING OF YOUR SURGERY.  Do not eat food :After Midnight.             Clear liquids are allowed after midnight until 10am on 10/15/15     Take these medicines the morning of surgery with A SIP OF WATER:  None                               You may not have any metal on your body including hair pins and              piercings  Do not wear jewelry, make-up, lotions, powders or perfumes, deodorant             Do not wear nail polish.  Do not shave  48 hours prior to surgery.              Men may shave face and neck.   Do not bring valuables to the hospital. [facility] IS NOT             RESPONSIBLE   FOR VALUABLES.  Contacts, dentures or bridgework may not be worn into surgery.  Leave suitcase in the car. After surgery it may be brought to your room.     _____________________________________________________________________             [facility] - Preparing for [facility] Before surgery, you can play an important role.  Because skin is not sterile, your skin needs to be as free of germs as possible.  You can reduce the number of germs on your skin by washing with CHG (chlorahexidine gluconate) soap before surgery.  CHG is an antiseptic cleaner which kills germs and bonds with the skin to continue killing germs even after washing. Please DO NOT use if you have an allergy to CHG or antibacterial soaps.  If your skin becomes reddened/irritated stop using the CHG and inform your nurse when you arrive at Short Stay. Do not shave (including legs and underarms) for at least 48 hours prior to the first CHG shower.  You may shave your face/neck. Please  follow these instructions carefully:  1.  Shower with CHG Soap the night before surgery and the  morning of Surgery.  2.  If you choose to wash your hair, wash your hair first as usual with your  normal  shampoo.  3.  After you shampoo, rinse your hair and body thoroughly to remove the  shampoo.                           4.  Use CHG as you would any other liquid soap.  You can apply chg directly  to the skin and wash                       Gently with a scrungie or clean washcloth.  5.  Apply the CHG Soap to your body  ONLY FROM THE NECK DOWN.   Do not use on face/ open                           Wound or open sores. Avoid contact with eyes, ears mouth and genitals (private parts).                       Wash face,  Genitals (private parts) with your normal soap.             6.  Wash thoroughly, paying special attention to the area where your surgery  will be performed.  7.  Thoroughly rinse your body with warm water from the neck down.  8.  DO NOT shower/wash with your normal soap after using and rinsing off  the CHG Soap.                9.  Pat yourself dry with a clean towel.            10.  Wear clean pajamas.            11.  Place clean sheets on your bed the night of your first shower and do not  sleep with pets. Day of Surgery : Do not apply any lotions/deodorants the morning of surgery.  Please wear clean clothes to the hospital/surgery center.  FAILURE TO FOLLOW THESE INSTRUCTIONS MAY RESULT IN THE CANCELLATION OF YOUR SURGERY PATIENT SIGNATURE_________________________________  NURSE SIGNATURE__________________________________  ________________________________________________________________________    CLEAR LIQUID DIET   Foods Allowed                                                                     Foods Excluded  Coffee and tea, regular and decaf                             liquids that you cannot  Plain Jell-O in any flavor                                             see  through such as: Fruit ices (not with fruit pulp)                                     milk, soups, orange juice  Iced Popsicles                                    All solid food Carbonated beverages, regular and diet                                    Cranberry, grape and apple juices Sports drinks like Gatorade Lightly seasoned clear broth or consume(fat free) Sugar, honey syrup  Sample Menu Breakfast  Lunch                                     Supper Cranberry juice                    Beef broth                            Chicken broth Jell-O                                     Grape juice                           Apple juice Coffee or tea                        Jell-O                                      Popsicle                                                Coffee or tea                        Coffee or tea  _____________________________________________________________________     Incentive Spirometer  An incentive spirometer is a tool that can help keep your lungs clear and active. This tool measures how well you are filling your lungs with each breath. Taking long deep breaths may help reverse or decrease the chance of developing breathing (pulmonary) problems (especially infection) following:  A long period of time when you are unable to move or be active. BEFORE THE PROCEDURE   If the spirometer includes an indicator to show your best effort, your nurse or respiratory therapist will set it to a desired goal.  If possible, sit up straight or lean slightly forward. Try not to slouch.  Hold the incentive spirometer in an upright position. INSTRUCTIONS FOR USE   Sit on the edge of your bed if possible, or sit up as far as you can in bed or on a chair.  Hold the incentive spirometer in an upright position.  Breathe out normally.  Place the mouthpiece in your mouth and seal your lips tightly around it.  Breathe in slowly and as deeply as  possible, raising the piston or the ball toward the top of the column.  Hold your breath for 3-5 seconds or for as long as possible. Allow the piston or ball to fall to the bottom of the column.  Remove the mouthpiece from your mouth and breathe out normally.  Rest for a few seconds and repeat Steps 1 through 7 at least 10 times every 1-2 hours when you are awake. Take your time and take a few normal breaths between deep breaths.  The spirometer may include an indicator to show your best effort. Use the indicator as a goal to work toward during each repetition.  After each set of 10 deep  breaths, practice coughing to be sure your lungs are clear. If you have an incision (the cut made at the time of surgery), support your incision when coughing by placing a pillow or rolled up towels firmly against it. Once you are able to get out of bed, walk around indoors and cough well. You may stop using the incentive spirometer when instructed by your caregiver.  RISKS AND COMPLICATIONS  Take your time so you do not get dizzy or light-headed.  If you are in pain, you may need to take or ask for pain medication before doing incentive spirometry. It is harder to take a deep breath if you are having pain. AFTER USE  Rest and breathe slowly and easily.  It can be helpful to keep track of a log of your progress. Your caregiver can provide you with a simple table to help with this. If you are using the spirometer at home, follow these instructions: SEEK MEDICAL CARE IF:   You are having difficultly using the spirometer.  You have trouble using the spirometer as often as instructed.  Your pain medication is not giving enough relief while using the spirometer.  You develop fever of 100.5 F (38.1 C) or higher. SEEK IMMEDIATE MEDICAL CARE IF:   You cough up bloody sputum that had not been present before.  You develop fever of 102 F (38.9 C) or greater.  You develop worsening pain at or near the  incision site. MAKE SURE YOU:   Understand these instructions.  Will watch your condition.  Will get help right away if you are not doing well or get worse. Document Released: 08/31/2006 Document Revised: 07/13/2011 Document Reviewed: 11/01/2006 Exit[employer] Patient Information 2014 Exit[employer], [place].   ________________________________________________________________________  WHAT IS A BLOOD TRANSFUSION? Blood Transfusion Information  A transfusion is the replacement of blood or some of its parts. Blood is made up of multiple cells which provide different functions.  [name] blood cells carry oxygen and are used for blood loss replacement.  White blood cells fight against infection.  Platelets control bleeding.  Plasma helps clot blood.  Other blood products are available for specialized needs, such as hemophilia or other clotting disorders. BEFORE THE TRANSFUSION  Who gives blood for transfusions?   Healthy volunteers who are fully evaluated to make sure their blood is safe. This is blood bank blood. Transfusion therapy is the safest it has ever been in the practice of medicine. Before blood is taken from a donor, a complete history is taken to make sure that person has no history of diseases nor engages in risky social behavior (examples are intravenous drug use or sexual activity with multiple partners). The donor's travel history is screened to minimize risk of transmitting infections, such as malaria. The donated blood is tested for signs of infectious diseases, such as HIV and hepatitis. The blood is then tested to be sure it is compatible with you in order to minimize the chance of a transfusion reaction. If you or a relative donates blood, this is often done in anticipation of surgery and is not appropriate for emergency situations. It takes many days to process the donated blood. RISKS AND COMPLICATIONS Although transfusion therapy is very safe and saves many lives, the main dangers  of transfusion include:   Getting an infectious disease.  Developing a transfusion reaction. This is an allergic reaction to something in the blood you were given. Every precaution is taken to prevent this. The decision to have a blood transfusion has  been considered carefully by your caregiver before blood is given. Blood is not given unless the benefits outweigh the risks. AFTER THE TRANSFUSION  Right after receiving a blood transfusion, you will usually feel much better and more energetic. This is especially true if your [name] blood cells have gotten low (anemic). The transfusion raises the level of the [name] blood cells which carry oxygen, and this usually causes an energy increase.  The nurse administering the transfusion will monitor you carefully for complications. HOME CARE INSTRUCTIONS  No special instructions are needed after a transfusion. You may find your energy is better. Speak with your caregiver about any limitations on activity for underlying diseases you may have. SEEK MEDICAL CARE IF:   Your condition is not improving after your transfusion.  You develop redness or irritation at the intravenous (IV) site. SEEK IMMEDIATE MEDICAL CARE IF:  Any of the following symptoms occur over the next 12 hours:  Shaking chills.  You have a temperature by mouth above 102 F (38.9 C), not controlled by medicine.  Chest, back, or muscle pain.  People around you feel you are not acting correctly or are confused.  Shortness of breath or difficulty breathing.  Dizziness and fainting.  You get a rash or develop hives.  You have a decrease in urine output.  Your urine turns a dark color or changes to pink, [name], or brown. Any of the following symptoms occur over the next 10 days:  You have a temperature by mouth above 102 F (38.9 C), not controlled by medicine.  Shortness of breath.  Weakness after normal activity.  The white part of the eye turns yellow (jaundice).  You  have a decrease in the amount of urine or are urinating less often.  Your urine turns a dark color or changes to pink, [name], or brown. Document Released: 04/17/2000 Document Revised: 07/13/2011 Document Reviewed: 12/05/2007 [facility] Patient Information 2014 [place], [place].  _______________________________________________________________________

## 2015-10-03 DIAGNOSIS — M25571 Pain in right ankle and joints of right foot: Secondary | ICD-10-CM | POA: Diagnosis not present

## 2015-10-04 ENCOUNTER — Encounter (HOSPITAL_COMMUNITY)
Admission: RE | Admit: 2015-10-04 | Discharge: 2015-10-04 | Disposition: A | Discharge status: Home / Self Care | Payer: Medicare Other | Source: Ambulatory Visit | Attending: Orthopedic Surgery | Admitting: Orthopedic Surgery

## 2015-10-04 ENCOUNTER — Encounter (HOSPITAL_COMMUNITY): Payer: Self-pay

## 2015-10-04 DIAGNOSIS — Z0183 Encounter for blood typing: Secondary | ICD-10-CM | POA: Diagnosis not present

## 2015-10-04 DIAGNOSIS — Z01812 Encounter for preprocedural laboratory examination: Secondary | ICD-10-CM | POA: Diagnosis not present

## 2015-10-04 DIAGNOSIS — M1612 Unilateral primary osteoarthritis, left hip: Secondary | ICD-10-CM | POA: Insufficient documentation

## 2015-10-04 HISTORY — DX: Other complications of anesthesia, initial encounter: T88.59XA

## 2015-10-04 HISTORY — DX: Adverse effect of unspecified anesthetic, initial encounter: T41.45XA

## 2015-10-04 LAB — CBC
HCT: 43.6 % (ref 36.0–46.0)
Hemoglobin: 14.2 g/dL (ref 12.0–15.0)
MCH: 30.5 pg (ref 26.0–34.0)
MCHC: 32.6 g/dL (ref 30.0–36.0)
MCV: 93.6 fL (ref 78.0–100.0)
Platelets: 159 10*3/uL (ref 150–400)
RBC: 4.66 MIL/uL (ref 3.87–5.11)
RDW: 12.5 % (ref 11.5–15.5)
WBC: 7.2 10*3/uL (ref 4.0–10.5)

## 2015-10-04 LAB — SURGICAL PCR SCREEN
MRSA, PCR: NEGATIVE
Staphylococcus aureus: NEGATIVE

## 2015-10-04 LAB — TYPE AND SCREEN
ABO/RH(D): A NEG
Antibody Screen: NEGATIVE

## 2015-10-04 NOTE — Pre-Procedure Instructions (Signed)
Medical Clearance, Dr. Margo AyeHall, on chart

## 2015-10-10 NOTE — H&P (Signed)
TOTAL HIP ADMISSION H&P  Patient is admitted for left total hip arthroplasty, anterior approach.  Subjective:  Chief Complaint:   Left hip primary OA / pain  HPI: [name] [name], 82 y.o. female, has a history of pain and functional disability in the left hip(s) due to arthritis and patient has failed non-surgical conservative treatments for greater than 12 weeks to include NSAID's and/or analgesics and activity modification.  Onset of symptoms was gradual starting 5+ months ago with rapidlly worsening course since that time.The patient noted prior procedures of the hip to include arthroplasty on the right hip per Dr. Charlann Boxerlin in 2015.  Patient currently rates pain in the left hip at 10 out of 10 with activity. Patient has night pain, worsening of pain with activity and weight bearing, trendelenberg gait, pain that interfers with activities of daily living and pain with passive range of motion. Patient has evidence of periarticular osteophytes and joint space narrowing by imaging studies. This condition presents safety issues increasing the risk of falls.  There is no current active infection.   Risks, benefits and expectations were discussed with the patient.  Risks including but not limited to the risk of anesthesia, blood clots, nerve damage, blood vessel damage, failure of the prosthesis, infection and up to and including death.  Patient understand the risks, benefits and expectations and wishes to proceed with surgery.   PCP: Dwana MelenaZack Hall, MD  D/C Plans:      Home  Post-op Meds:       No Rx given  Tranexamic Acid:      To be given - IV   Decadron:      Is to be given  FYI:     ASA  Norco    Patient Active Problem List   Diagnosis Date Noted  . Right leg weakness 08/23/2013  . Unstable balance 08/23/2013  . Expected blood loss anemia 08/02/2013  . S/P right THA, AA 08/01/2013  . MUSCLE WEAKNESS (GENERALIZED) 11/29/2009  . FATIGUE 11/29/2009  . GENERALIZED PAIN 11/29/2009  .  OSTEOARTHRITIS, HIP, RIGHT 09/16/2009  . HIP PAIN 03/14/2009  . SPINAL STENOSIS 03/14/2009  . SCOLIOSIS, LUMBAR SPINE 03/14/2009   Past Medical History  Diagnosis Date  . Arthritis   . Osteoporosis   . Frequency of urination   . Glaucoma   . Complication of anesthesia     pt states she had trouble waking up    Past Surgical History  Procedure Laterality Date  . Cholecystectomy  1980'S  . Back surgery  1967 / 1970'S    X 2  . Wrist fracture surgery    . Total hip arthroplasty Right 08/01/2013    Procedure: TOTAL RIGHT HIP ARTHROPLASTY ANTERIOR APPROACH;  Surgeon: Shelda PalMatthew D Olin, MD;  Location: [facility];  Service: Orthopedics;  Laterality: Right;  . Eye surgery      cataract and lasik for glaucoma    No prescriptions prior to admission   Allergies  Allergen Reactions  . Sulfonamide Derivatives Itching and Swelling  . Alphagan [Brimonidine] Itching and Rash  . Cosopt [Dorzolamide Hcl-Timolol Mal] Itching, Swelling and Rash  . Lumigan [Bimatoprost] Itching and Rash  . Pilocarpine Itching and Rash  . Timolol Itching and Rash    Social History  Substance Use Topics  . Smoking status: Never Smoker   . Smokeless tobacco: Never Used  . Alcohol Use: Yes     Comment: OCCASIONAL WINE       Review of Systems  Constitutional: Positive for  malaise/fatigue.  HENT: Negative.   Eyes: Negative.   Respiratory: Negative.   Cardiovascular: Negative.   Gastrointestinal: Negative.   Genitourinary: Positive for frequency.  Musculoskeletal: Positive for myalgias and joint pain.  Skin: Negative.   Neurological: Negative.   Endo/Heme/Allergies: Negative.   Psychiatric/Behavioral: Negative.     Objective:  Physical Exam  Constitutional: She is oriented to person, place, and time. She appears well-developed.  HENT:  Head: Normocephalic.  Eyes: Pupils are equal, round, and reactive to light.  Neck: Neck supple. No JVD present. No tracheal deviation present. No thyromegaly present.   Cardiovascular: Normal rate, regular rhythm, normal heart sounds and intact distal pulses.   Respiratory: Effort normal and breath sounds normal. No stridor. No respiratory distress. She has no wheezes.  GI: Soft. There is no tenderness. There is no guarding.  Musculoskeletal:       Left hip: She exhibits decreased range of motion, decreased strength, tenderness and bony tenderness. She exhibits no swelling, no deformity and no laceration.  Lymphadenopathy:    She has no cervical adenopathy.  Neurological: She is alert and oriented to person, place, and time.  Skin: Skin is warm and dry.  Psychiatric: She has a normal mood and affect.      Labs:  Estimated body mass index is 25.30 kg/(m^2) as calculated from the following:   Height as of 08/03/13:  (1.575 m).   Weight as of 07/24/13: 62.766 kg (138 lb 6 oz).   Imaging Review Plain radiographs demonstrate severe degenerative joint disease of the left hip(s). The bone quality appears to be good for age and reported activity level.  Assessment/Plan:  End stage arthritis, left hip(s)  The patient history, physical examination, clinical judgement of the provider and imaging studies are consistent with end stage degenerative joint disease of the left hip(s) and total hip arthroplasty is deemed medically necessary. The treatment options including medical management, injection therapy, arthroscopy and arthroplasty were discussed at length. The risks and benefits of total hip arthroplasty were presented and reviewed. The risks due to aseptic loosening, infection, stiffness, dislocation/subluxation,  thromboembolic complications and other imponderables were discussed.  The patient acknowledged the explanation, agreed to proceed with the plan and consent was signed. Patient is being admitted for inpatient treatment for surgery, pain control, PT, OT, prophylactic antibiotics, VTE prophylaxis, progressive ambulation and ADL's and discharge  planning.The patient is planning to be discharged home.     Anastasio Auerbach Ashara Lounsbury   PA-C  10/10/2015, 8:03 AM

## 2015-10-15 ENCOUNTER — Inpatient Hospital Stay (HOSPITAL_COMMUNITY): Payer: Medicare Other | Admitting: Anesthesiology

## 2015-10-15 ENCOUNTER — Encounter (HOSPITAL_COMMUNITY): Payer: Self-pay | Admitting: *Deleted

## 2015-10-15 ENCOUNTER — Inpatient Hospital Stay (HOSPITAL_COMMUNITY): Payer: Medicare Other

## 2015-10-15 ENCOUNTER — Encounter (HOSPITAL_COMMUNITY)
Admission: RE | Disposition: A | Discharge status: Home Health | Payer: Self-pay | Source: Ambulatory Visit | Attending: Orthopedic Surgery

## 2015-10-15 ENCOUNTER — Inpatient Hospital Stay (HOSPITAL_COMMUNITY)
Admission: RE | Admit: 2015-10-15 | Discharge: 2015-10-18 | DRG: 470 | Disposition: A | Discharge status: Home Health | Payer: Medicare Other | Source: Ambulatory Visit | Attending: Orthopedic Surgery | Admitting: Orthopedic Surgery

## 2015-10-15 DIAGNOSIS — M810 Age-related osteoporosis without current pathological fracture: Secondary | ICD-10-CM | POA: Diagnosis not present

## 2015-10-15 DIAGNOSIS — M1612 Unilateral primary osteoarthritis, left hip: Principal | ICD-10-CM | POA: Diagnosis present

## 2015-10-15 DIAGNOSIS — Z96641 Presence of right artificial hip joint: Secondary | ICD-10-CM | POA: Diagnosis present

## 2015-10-15 DIAGNOSIS — Z96642 Presence of left artificial hip joint: Secondary | ICD-10-CM | POA: Diagnosis not present

## 2015-10-15 DIAGNOSIS — M25552 Pain in left hip: Secondary | ICD-10-CM | POA: Diagnosis not present

## 2015-10-15 DIAGNOSIS — Z96649 Presence of unspecified artificial hip joint: Secondary | ICD-10-CM

## 2015-10-15 DIAGNOSIS — Z471 Aftercare following joint replacement surgery: Secondary | ICD-10-CM | POA: Diagnosis not present

## 2015-10-15 DIAGNOSIS — I959 Hypotension, unspecified: Secondary | ICD-10-CM | POA: Diagnosis not present

## 2015-10-15 DIAGNOSIS — H409 Unspecified glaucoma: Secondary | ICD-10-CM | POA: Diagnosis not present

## 2015-10-15 HISTORY — PX: TOTAL HIP ARTHROPLASTY: SHX124

## 2015-10-15 SURGERY — ARTHROPLASTY, HIP, TOTAL, ANTERIOR APPROACH
Anesthesia: Spinal | Site: Hip | Laterality: Left

## 2015-10-15 MED ORDER — ONDANSETRON HCL 4 MG/2ML IJ SOLN
INTRAMUSCULAR | Status: AC
  Filled 2015-10-15: qty 2

## 2015-10-15 MED ORDER — TRANEXAMIC ACID 1000 MG/10ML IV SOLN
1000.0000 mg | Freq: Once | INTRAVENOUS | Status: AC
  Administered 2015-10-15: 1000 mg via INTRAVENOUS
  Filled 2015-10-15: qty 10

## 2015-10-15 MED ORDER — FENTANYL CITRATE (PF) 100 MCG/2ML IJ SOLN
INTRAMUSCULAR | Status: DC | PRN
  Administered 2015-10-15: 50 ug via INTRAVENOUS

## 2015-10-15 MED ORDER — METHOCARBAMOL 1000 MG/10ML IJ SOLN
500.0000 mg | Freq: Four times a day (QID) | INTRAVENOUS | Status: DC | PRN
  Filled 2015-10-15: qty 5

## 2015-10-15 MED ORDER — ASPIRIN EC 325 MG PO TBEC
325.0000 mg | DELAYED_RELEASE_TABLET | Freq: Two times a day (BID) | ORAL | Status: DC
  Administered 2015-10-16 – 2015-10-18 (×5): 325 mg via ORAL
  Filled 2015-10-15 (×7): qty 1

## 2015-10-15 MED ORDER — DEXAMETHASONE SODIUM PHOSPHATE 10 MG/ML IJ SOLN
10.0000 mg | Freq: Once | INTRAMUSCULAR | Status: AC
  Administered 2015-10-16: 10 mg via INTRAVENOUS
  Filled 2015-10-15: qty 1

## 2015-10-15 MED ORDER — ROCURONIUM BROMIDE 100 MG/10ML IV SOLN
INTRAVENOUS | Status: AC
Start: 2015-10-15 — End: 2015-10-15
  Filled 2015-10-15: qty 1

## 2015-10-15 MED ORDER — MAGNESIUM CITRATE PO SOLN: 1.0000 | Freq: Once | ORAL | Status: DC | PRN

## 2015-10-15 MED ORDER — DIPHENHYDRAMINE HCL 25 MG PO CAPS: 25.0000 mg | ORAL_CAPSULE | Freq: Four times a day (QID) | ORAL | Status: DC | PRN

## 2015-10-15 MED ORDER — METOCLOPRAMIDE HCL 5 MG/ML IJ SOLN: 5.0000 mg | Freq: Three times a day (TID) | INTRAMUSCULAR | Status: DC | PRN

## 2015-10-15 MED ORDER — FENTANYL CITRATE (PF) 100 MCG/2ML IJ SOLN
INTRAMUSCULAR | Status: AC
  Filled 2015-10-15: qty 2

## 2015-10-15 MED ORDER — POLYETHYLENE GLYCOL 3350 17 G PO PACK
17.0000 g | PACK | Freq: Two times a day (BID) | ORAL | Status: DC
  Administered 2015-10-16 – 2015-10-18 (×5): 17 g via ORAL
  Filled 2015-10-15 (×9): qty 1

## 2015-10-15 MED ORDER — PROPOFOL 500 MG/50ML IV EMUL
INTRAVENOUS | Status: DC | PRN
  Administered 2015-10-15: 50 ug/kg/min via INTRAVENOUS

## 2015-10-15 MED ORDER — LIDOCAINE HCL (CARDIAC) 20 MG/ML IV SOLN
INTRAVENOUS | Status: DC | PRN
  Administered 2015-10-15: 50 mg via INTRATRACHEAL

## 2015-10-15 MED ORDER — LIDOCAINE HCL (CARDIAC) 20 MG/ML IV SOLN
INTRAVENOUS | Status: AC
  Filled 2015-10-15: qty 5

## 2015-10-15 MED ORDER — HYDROMORPHONE HCL 1 MG/ML IJ SOLN: 0.2500 mg | INTRAMUSCULAR | Status: DC | PRN

## 2015-10-15 MED ORDER — HYDROMORPHONE HCL 1 MG/ML IJ SOLN: 0.5000 mg | INTRAMUSCULAR | Status: DC | PRN

## 2015-10-15 MED ORDER — POTASSIUM CHLORIDE 2 MEQ/ML IV SOLN
100.0000 mL/h | INTRAVENOUS | Status: DC
Start: 2015-10-15 — End: 2015-10-18
  Administered 2015-10-15 – 2015-10-16 (×2): 100 mL/h via INTRAVENOUS
  Filled 2015-10-15 (×10): qty 1000

## 2015-10-15 MED ORDER — MEPERIDINE HCL 50 MG/ML IJ SOLN: 6.2500 mg | INTRAMUSCULAR | Status: DC | PRN

## 2015-10-15 MED ORDER — ONDANSETRON HCL 4 MG/2ML IJ SOLN: 4.0000 mg | Freq: Once | INTRAMUSCULAR | Status: DC | PRN

## 2015-10-15 MED ORDER — PROPOFOL 10 MG/ML IV BOLUS
INTRAVENOUS | Status: DC | PRN
  Administered 2015-10-15 (×2): 20 mg via INTRAVENOUS

## 2015-10-15 MED ORDER — METHOCARBAMOL 500 MG PO TABS
500.0000 mg | ORAL_TABLET | Freq: Four times a day (QID) | ORAL | Status: DC | PRN
  Administered 2015-10-15 – 2015-10-16 (×3): 500 mg via ORAL
  Filled 2015-10-15 (×3): qty 1

## 2015-10-15 MED ORDER — CEFAZOLIN SODIUM-DEXTROSE 2-4 GM/100ML-% IV SOLN
INTRAVENOUS | Status: AC
  Filled 2015-10-15: qty 100

## 2015-10-15 MED ORDER — HYDROCODONE-ACETAMINOPHEN 7.5-325 MG PO TABS
1.0000 | ORAL_TABLET | ORAL | Status: DC
  Administered 2015-10-15: 2 via ORAL
  Administered 2015-10-15 (×2): 1 via ORAL
  Administered 2015-10-16 (×2): 2 via ORAL
  Administered 2015-10-16: 1 via ORAL
  Administered 2015-10-16 – 2015-10-17 (×3): 2 via ORAL
  Filled 2015-10-15 (×5): qty 2
  Filled 2015-10-15: qty 1
  Filled 2015-10-15 (×3): qty 2

## 2015-10-15 MED ORDER — LACTATED RINGERS IV SOLN
INTRAVENOUS | Status: DC | PRN
  Administered 2015-10-15 (×2): via INTRAVENOUS

## 2015-10-15 MED ORDER — ONDANSETRON HCL 4 MG PO TABS: 4.0000 mg | ORAL_TABLET | Freq: Four times a day (QID) | ORAL | Status: DC | PRN

## 2015-10-15 MED ORDER — EPHEDRINE SULFATE 50 MG/ML IJ SOLN
INTRAMUSCULAR | Status: DC | PRN
  Administered 2015-10-15 (×2): 10 mg via INTRAVENOUS

## 2015-10-15 MED ORDER — CHLORHEXIDINE GLUCONATE 4 % EX LIQD: 60.0000 mL | Freq: Once | CUTANEOUS | Status: DC

## 2015-10-15 MED ORDER — BUPIVACAINE HCL (PF) 0.5 % IJ SOLN
INTRAMUSCULAR | Status: DC | PRN
  Administered 2015-10-15: 2.5 mL via INTRATHECAL

## 2015-10-15 MED ORDER — DEXAMETHASONE SODIUM PHOSPHATE 10 MG/ML IJ SOLN
10.0000 mg | Freq: Once | INTRAMUSCULAR | Status: AC
  Administered 2015-10-15: 10 mg via INTRAVENOUS

## 2015-10-15 MED ORDER — METOCLOPRAMIDE HCL 10 MG PO TABS: 5.0000 mg | ORAL_TABLET | Freq: Three times a day (TID) | ORAL | Status: DC | PRN

## 2015-10-15 MED ORDER — CEFAZOLIN SODIUM-DEXTROSE 2-4 GM/100ML-% IV SOLN
2.0000 g | INTRAVENOUS | Status: AC
  Administered 2015-10-15: 2 g via INTRAVENOUS

## 2015-10-15 MED ORDER — PROPOFOL 10 MG/ML IV BOLUS
INTRAVENOUS | Status: AC
  Filled 2015-10-15: qty 20

## 2015-10-15 MED ORDER — DEXAMETHASONE SODIUM PHOSPHATE 10 MG/ML IJ SOLN
INTRAMUSCULAR | Status: AC
Start: 2015-10-15 — End: 2015-10-15
  Filled 2015-10-15: qty 1

## 2015-10-15 MED ORDER — PHENOL 1.4 % MT LIQD: 1.0000 | OROMUCOSAL | Status: DC | PRN

## 2015-10-15 MED ORDER — MENTHOL 3 MG MT LOZG: 1.0000 | LOZENGE | OROMUCOSAL | Status: DC | PRN

## 2015-10-15 MED ORDER — BUPIVACAINE HCL (PF) 0.5 % IJ SOLN
INTRAMUSCULAR | Status: AC
  Filled 2015-10-15: qty 30

## 2015-10-15 MED ORDER — ONDANSETRON HCL 4 MG/2ML IJ SOLN
INTRAMUSCULAR | Status: DC | PRN
  Administered 2015-10-15: 4 mg via INTRAVENOUS

## 2015-10-15 MED ORDER — MIDAZOLAM HCL 2 MG/2ML IJ SOLN
INTRAMUSCULAR | Status: AC
  Filled 2015-10-15: qty 2

## 2015-10-15 MED ORDER — CEFAZOLIN SODIUM-DEXTROSE 2-4 GM/100ML-% IV SOLN
2.0000 g | Freq: Four times a day (QID) | INTRAVENOUS | Status: AC
  Administered 2015-10-15 (×2): 2 g via INTRAVENOUS
  Filled 2015-10-15 (×2): qty 100

## 2015-10-15 MED ORDER — ALUM & MAG HYDROXIDE-SIMETH 200-200-20 MG/5ML PO SUSP: 30.0000 mL | ORAL | Status: DC | PRN

## 2015-10-15 MED ORDER — SODIUM CHLORIDE 0.9 % IR SOLN
Status: DC | PRN
  Administered 2015-10-15: 1000 mL

## 2015-10-15 MED ORDER — FERROUS SULFATE 325 (65 FE) MG PO TABS
325.0000 mg | ORAL_TABLET | Freq: Three times a day (TID) | ORAL | Status: DC
  Administered 2015-10-16 – 2015-10-18 (×5): 325 mg via ORAL
  Filled 2015-10-15 (×11): qty 1

## 2015-10-15 MED ORDER — DOCUSATE SODIUM 100 MG PO CAPS
100.0000 mg | ORAL_CAPSULE | Freq: Two times a day (BID) | ORAL | Status: DC
  Administered 2015-10-15 – 2015-10-18 (×7): 100 mg via ORAL
  Filled 2015-10-15 (×11): qty 1

## 2015-10-15 MED ORDER — BISACODYL 10 MG RE SUPP: 10.0000 mg | Freq: Every day | RECTAL | Status: DC | PRN

## 2015-10-15 MED ORDER — ONDANSETRON HCL 4 MG/2ML IJ SOLN: 4.0000 mg | Freq: Four times a day (QID) | INTRAMUSCULAR | Status: DC | PRN

## 2015-10-15 SURGICAL SUPPLY — 37 items
BAG DECANTER FOR FLEXI CONT (MISCELLANEOUS) IMPLANT
BAG SPEC THK2 15X12 ZIP CLS (MISCELLANEOUS)
BAG ZIPLOCK 12X15 (MISCELLANEOUS) IMPLANT
CAPT HIP TOTAL 2 ×2 IMPLANT
CLOTH BEACON ORANGE TIMEOUT ST (SAFETY) ×3 IMPLANT
COVER PERINEAL POST (MISCELLANEOUS) ×3 IMPLANT
DRAPE STERI IOBAN 125X83 (DRAPES) ×3 IMPLANT
DRAPE U-SHAPE 47X51 STRL (DRAPES) ×6 IMPLANT
DRESSING AQUACEL AG SP 3.5X10 (GAUZE/BANDAGES/DRESSINGS) ×1 IMPLANT
DRSG AQUACEL AG ADV 3.5X10 (GAUZE/BANDAGES/DRESSINGS) ×2 IMPLANT
DRSG AQUACEL AG SP 3.5X10 (GAUZE/BANDAGES/DRESSINGS) ×3
DURAPREP 26ML APPLICATOR (WOUND CARE) ×3 IMPLANT
ELECT REM PT RETURN 15FT ADLT (MISCELLANEOUS) IMPLANT
ELECT REM PT RETURN 9FT ADLT (ELECTROSURGICAL) ×3
ELECTRODE REM PT RTRN 9FT ADLT (ELECTROSURGICAL) ×1 IMPLANT
GLOVE BIOGEL M STRL SZ7.5 (GLOVE) ×4 IMPLANT
GLOVE BIOGEL PI IND STRL 7.5 (GLOVE) ×1 IMPLANT
GLOVE BIOGEL PI IND STRL 8.5 (GLOVE) ×1 IMPLANT
GLOVE BIOGEL PI INDICATOR 7.5 (GLOVE) ×6
GLOVE BIOGEL PI INDICATOR 8.5 (GLOVE) ×2
GLOVE ECLIPSE 8.0 STRL XLNG CF (GLOVE) ×2 IMPLANT
GLOVE ORTHO TXT STRL SZ7.5 (GLOVE) ×3 IMPLANT
GOWN STRL REUS W/TWL LRG LVL3 (GOWN DISPOSABLE) ×5 IMPLANT
GOWN STRL REUS W/TWL XL LVL3 (GOWN DISPOSABLE) ×5 IMPLANT
HOLDER FOLEY CATH W/STRAP (MISCELLANEOUS) ×3 IMPLANT
LIQUID BAND (GAUZE/BANDAGES/DRESSINGS) ×3 IMPLANT
PACK ANTERIOR HIP CUSTOM (KITS) ×3 IMPLANT
SAW OSC TIP CART 19.5X105X1.3 (SAW) ×3 IMPLANT
SUT MNCRL AB 4-0 PS2 18 (SUTURE) ×3 IMPLANT
SUT VIC AB 1 CT1 36 (SUTURE) ×9 IMPLANT
SUT VIC AB 2-0 CT1 27 (SUTURE) ×6
SUT VIC AB 2-0 CT1 TAPERPNT 27 (SUTURE) ×2 IMPLANT
SUT VLOC 180 0 24IN GS25 (SUTURE) ×3 IMPLANT
TRAY FOLEY W/METER SILVER 14FR (SET/KITS/TRAYS/PACK) IMPLANT
TRAY FOLEY W/METER SILVER 16FR (SET/KITS/TRAYS/PACK) IMPLANT
WATER STERILE IRR 1500ML POUR (IV SOLUTION) ×3 IMPLANT
YANKAUER SUCT BULB TIP 10FT TU (MISCELLANEOUS) IMPLANT

## 2015-10-15 NOTE — Anesthesia Postprocedure Evaluation (Signed)
Anesthesia Post Note  Patient: [name] [name]  Procedure(s) Performed: Procedure(s) (LRB): LEFT TOTAL HIP ARTHROPLASTY ANTERIOR APPROACH (Left)  Patient location during evaluation: PACU Anesthesia Type: Spinal Level of consciousness: oriented and awake and alert Pain management: pain level controlled Vital Signs Assessment: post-procedure vital signs reviewed and stable Respiratory status: spontaneous breathing, respiratory function stable and patient connected to nasal cannula oxygen Cardiovascular status: blood pressure returned to baseline and stable Postop Assessment: no headache and no backache Anesthetic complications: no    Last Vitals:  Filed Vitals:   10/15/15 1309 10/15/15 1415  BP: 107/56 107/57  Pulse: 65 65  Temp: 36.8 C 36.5 C  Resp:  18    Last Pain:  Filed Vitals:   10/15/15 1416  PainSc: 0-No pain                 Briseis Aguilera DAVID

## 2015-10-15 NOTE — Op Note (Signed)
NAME:  [name] [name] NO.: [id number]      MEDICAL RECORD NO.: [id number]      FACILITY:  [facility]      PHYSICIAN:  Durene Romans D  DATE OF BIRTH:  [birth year]     DATE OF PROCEDURE:  10/15/2015                                 OPERATIVE REPORT         PREOPERATIVE DIAGNOSIS: Left  hip osteoarthritis.      POSTOPERATIVE DIAGNOSIS:  Left hip osteoarthritis.  History of right total hip replacement     PROCEDURE:  Left total hip replacement through an anterior approach   utilizing DePuy THR system, component size 52mm pinnacle cup, a size 36+4 neutral   Altrex liner, a size 5Hi Tri Lock stem with a 36+15 Articuleze metal head ball.      SURGEON:  Madlyn Frankel. [name], M.D.      ASSISTANT:  Skip Mayer, PA-C     ANESTHESIA:  Spinal.      SPECIMENS:  None.      COMPLICATIONS:  None.      BLOOD LOSS:  600 cc     DRAINS:  None.      INDICATION OF THE PROCEDURE:  [name] [name] is a 82 y.o. female who had   presented to office for evaluation of left hip pain.  Radiographs revealed   progressive degenerative changes with bone-on-bone   articulation to the  hip joint.  The patient had painful limited range of   motion significantly affecting their overall quality of life.  The patient was failing to    respond to conservative measures, and at this point was ready   to proceed with more definitive measures.  The patient has noted progressive   degenerative changes in his hip, progressive problems and dysfunction   with regarding the hip prior to surgery.  Consent was obtained for   benefit of pain relief.  Specific risk of infection, DVT, component   failure, dislocation, need for revision surgery, as well discussion of   the anterior versus posterior approach were reviewed.  Consent was   obtained for benefit of anterior pain relief through an anterior   approach.      PROCEDURE IN DETAIL:  The patient was brought to operative theater.    Once adequate anesthesia, preoperative antibiotics, 2 gm of Ancef, 1 gm of Tranexamic Acid, and 10 mg of Decadron administered.   The patient was positioned supine on the OSI Hanna table.  Once adequate   padding of boney process was carried out, we had predraped out the hip, and  used fluoroscopy to confirm orientation of the pelvis and position.      The left hip was then prepped and draped from proximal iliac crest to   mid thigh with shower curtain technique.      Time-out was performed identifying the patient, planned procedure, and   extremity.     An incision was then made 2 cm distal and lateral to the   anterior superior iliac spine extending over the orientation of the   tensor fascia lata muscle and sharp dissection was carried down to the   fascia of the muscle and protractor placed in the soft tissues.  The fascia was then incised.  The muscle belly was identified and swept   laterally and retractor placed along the superior neck.  Following   cauterization of the circumflex vessels and removing some pericapsular   fat, a second cobra retractor was placed on the inferior neck.  A third   retractor was placed on the anterior acetabulum after elevating the   anterior rectus.  A L-capsulotomy was along the line of the   superior neck to the trochanteric fossa, then extended proximally and   distally.  Tag sutures were placed and the retractors were then placed   intracapsular.  We then identified the trochanteric fossa and   orientation of my neck cut, confirmed this radiographically   and then made a neck osteotomy with the femur on traction.  The femoral   head was removed without difficulty or complication.  Traction was let   off and retractors were placed posterior and anterior around the   acetabulum.      The labrum and foveal tissue were debrided.  I began reaming with a 45mm   reamer and reamed up to 51mm reamer with good bony bed preparation and a 52mm    cup was chosen.  The final 52mm Pinnacle cup was then impacted under fluoroscopy  to confirm the depth of penetration and orientation with respect to   abduction.  A screw was placed followed by the hole eliminator.  The final   36+4 neutral Altrex liner was impacted with good visualized rim fit.  The cup was positioned anatomically within the acetabular portion of the pelvis.      At this point, the femur was rolled at 80 degrees.  Further capsule was   released off the inferior aspect of the femoral neck.  I then   released the superior capsule proximally.  The hook was placed laterally   along the femur and elevated manually and held in position with the bed   hook.  The leg was then extended and adducted with the leg rolled to 100   degrees of external rotation.  Once the proximal femur was fully   exposed, I used a box osteotome to set orientation.  I then began   broaching with the starting chili pepper broach and passed this by hand and then broached up to 5.  With the 5 broach in place I chose a high offset neck and did several trial reductions.  The offset was appropriate, leg lengths   appeared to be equal best matched with the +1.5 head ball confirmed radiographically.   Given these findings, I went ahead and dislocated the hip, repositioned all   retractors and positioned the right hip in the extended and abducted position.  The final 5 Hi Tri Lock stem was   chosen and it was impacted down to the level of neck cut.  Based on this   and the trial reduction, a size 36+1.5 Articuleze metal head ball was chosen and   impacted onto a clean and dry trunnion, and the hip was reduced.  The   hip had been irrigated throughout the case again at this point.  I did   reapproximate the superior capsular leaflet to the anterior leaflet   using #1 Vicryl.  The fascia of the   tensor fascia lata muscle was then reapproximated using #1 Vicryl and #0 V-lock sutures.  The   remaining wound was  closed with 2-0 Vicryl and running 4-0 Monocryl.   The  hip was cleaned, dried, and dressed sterilely using Dermabond and   Aquacel dressing.  She was then brought   to recovery room in stable condition tolerating the procedure well.    Skip Mayer, PA-C was present for the entirety of the case involved from   preoperative positioning, perioperative retractor management, general   facilitation of the case, as well as primary wound closure as assistant.            Madlyn Frankel [name], M.D.        10/15/2015 10:13 AM

## 2015-10-15 NOTE — Discharge Instructions (Signed)

## 2015-10-15 NOTE — Transfer of Care (Signed)
Immediate Anesthesia Transfer of Care Note  Patient: Red Christiansorothy A Mcchesney  Procedure(s) Performed: Procedure(s): LEFT TOTAL HIP ARTHROPLASTY ANTERIOR APPROACH (Left)  Patient Location: PACU  Anesthesia Type:MAC and Spinal  Level of Consciousness: awake, alert , oriented and patient cooperative  Airway & Oxygen Therapy: Patient Spontanous Breathing and Patient connected to face mask oxygen  Post-op Assessment: Report given to RN and Post -op Vital signs reviewed and stable  Post vital signs: Reviewed and stable  Last Vitals:  Filed Vitals:   10/15/15 0612  BP: 134/73  Pulse: 62  Temp: 36.8 C  Resp: 16    Last Pain: There were no vitals filed for this visit.    Patients Stated Pain Goal: 4 (10/15/15 [id number])  Complications: No apparent anesthesia complications

## 2015-10-15 NOTE — Anesthesia Preprocedure Evaluation (Signed)
Anesthesia Evaluation  Patient identified by MRN, date of birth, ID band Patient awake    Reviewed: Allergy & Precautions, NPO status , Patient's Chart, lab work & pertinent test results  Airway Mallampati: I  TM Distance: >3 FB Neck ROM: Full    Dental   Pulmonary    Pulmonary exam normal        Cardiovascular Normal cardiovascular exam     Neuro/Psych    GI/Hepatic   Endo/Other    Renal/GU      Musculoskeletal   Abdominal   Peds  Hematology   Anesthesia Other Findings   Reproductive/Obstetrics                             Anesthesia Physical Anesthesia Plan  ASA: II  Anesthesia Plan: Spinal   Post-op Pain Management:    Induction: Intravenous  Airway Management Planned: Simple Face Mask  Additional Equipment:   Intra-op Plan:   Post-operative Plan:   Informed Consent: I have reviewed the patients History and Physical, chart, labs and discussed the procedure including the risks, benefits and alternatives for the proposed anesthesia with the patient or authorized representative who has indicated his/her understanding and acceptance.     Plan Discussed with: CRNA and Surgeon  Anesthesia Plan Comments:         Anesthesia Quick Evaluation

## 2015-10-15 NOTE — Anesthesia Procedure Notes (Signed)
Spinal Patient location during procedure: OR Start time: 10/15/2015 8:45 AM End time: 10/15/2015 8:50 AM Staffing Anesthesiologist: Arta BruceSSEY, Addylynn Balin Performed by: anesthesiologist  Preanesthetic Checklist Completed: patient identified, site marked, surgical consent, pre-op evaluation, timeout performed, IV checked, risks and benefits discussed and monitors and equipment checked Spinal Block Patient position: sitting Prep: Betadine Patient monitoring: heart rate, cardiac monitor, continuous pulse ox and blood pressure Approach: midline Location: L4-5 Injection technique: single-shot Needle Needle type: Pencan  Needle gauge: 24 G Needle length: 9 cm Needle insertion depth: 4 cm

## 2015-10-15 NOTE — Interval H&P Note (Signed)
History and Physical Interval Note:  10/15/2015 7:05 AM  [name] [name]  has presented today for surgery, with the diagnosis of LEFT HIP OA   The various methods of treatment have been discussed with the patient and family. After consideration of risks, benefits and other options for treatment, the patient has consented to  Procedure(s): LEFT TOTAL HIP ARTHROPLASTY ANTERIOR APPROACH (Left) as a surgical intervention .  The patient's history has been reviewed, patient examined, no change in status, stable for surgery.  I have reviewed the patient's chart and labs.  Questions were answered to the patient's satisfaction.     Shelda PalLIN,[place] Doig D

## 2015-10-15 NOTE — Progress Notes (Signed)
PT Cancellation Note  Patient Details Name: Bud FaceDorothy A Anagnos MRN: [id number] DOB: 06/08/1923   Cancelled Treatment:    Reason Eval/Treat Not Completed: Attempted PT eval-pt declined OOB on today. Will check back tomorrow for PT eval.   Rebeca AlertJannie Alim Cattell, MPT Pager: [phone]

## 2015-10-16 LAB — BASIC METABOLIC PANEL
Anion gap: 3 — ABNORMAL LOW (ref 5–15)
BUN: 13 mg/dL (ref 6–20)
CO2: 27 mmol/L (ref 22–32)
Calcium: 8.3 mg/dL — ABNORMAL LOW (ref 8.9–10.3)
Chloride: 108 mmol/L (ref 101–111)
Creatinine, Ser: 0.65 mg/dL (ref 0.44–1.00)
GFR calc Af Amer: >60 mL/min (ref >60)
GFR calc non Af Amer: >60 mL/min (ref >60)
Glucose, Bld: 140 mg/dL — ABNORMAL HIGH (ref 65–99)
Potassium: 4.4 mmol/L (ref 3.5–5.1)
Sodium: 138 mmol/L (ref 135–145)

## 2015-10-16 LAB — CBC
HCT: 34.3 % — ABNORMAL LOW (ref 36.0–46.0)
Hemoglobin: 11.4 g/dL — ABNORMAL LOW (ref 12.0–15.0)
MCH: 30 pg (ref 26.0–34.0)
MCHC: 33.2 g/dL (ref 30.0–36.0)
MCV: 90.3 fL (ref 78.0–100.0)
Platelets: 127 10*3/uL — ABNORMAL LOW (ref 150–400)
RBC: 3.8 MIL/uL — ABNORMAL LOW (ref 3.87–5.11)
RDW: 12 % (ref 11.5–15.5)
WBC: 15.4 10*3/uL — ABNORMAL HIGH (ref 4.0–10.5)

## 2015-10-16 MED ORDER — ASPIRIN 325 MG PO TBEC: 325.0000 mg | DELAYED_RELEASE_TABLET | Freq: Two times a day (BID) | ORAL | Status: DC

## 2015-10-16 MED ORDER — POLYETHYLENE GLYCOL 3350 17 G PO PACK: 17.0000 g | PACK | Freq: Two times a day (BID) | ORAL | Status: DC

## 2015-10-16 MED ORDER — HYDROCODONE-ACETAMINOPHEN 7.5-325 MG PO TABS: 1.0000 | ORAL_TABLET | ORAL | Status: DC | PRN

## 2015-10-16 MED ORDER — DOCUSATE SODIUM 100 MG PO CAPS: 100.0000 mg | ORAL_CAPSULE | Freq: Two times a day (BID) | ORAL | Status: DC

## 2015-10-16 MED ORDER — TIZANIDINE HCL 4 MG PO TABS: 4.0000 mg | ORAL_TABLET | Freq: Four times a day (QID) | ORAL | Status: DC | PRN

## 2015-10-16 MED ORDER — FERROUS SULFATE 325 (65 FE) MG PO TABS: 325.0000 mg | ORAL_TABLET | Freq: Three times a day (TID) | ORAL | Status: DC

## 2015-10-16 NOTE — Progress Notes (Signed)
Physical Therapy Treatment Patient Details Name: [name] FaceDorothy A [name] MRN: [id number] DOB: 05/02/1924 Today's Date: 10/16/2015    History of Present Illness 82 yo female s/p L THA-direct anterior 10/15/15.     PT Comments    Progressing slowly with mobility. Some mild confusion noted-? Pain meds. Will continue to follow and progress activity as able. Recommend HHPT.   Follow Up Recommendations  Home health PT;Supervision/Assistance - 24 hour     Equipment Recommendations  None recommended by PT    Recommendations for Other Services       Precautions / Restrictions Precautions Precautions: Fall Restrictions Weight Bearing Restrictions: No LLE Weight Bearing: Weight bearing as tolerated    Mobility  Bed Mobility Overal bed mobility: Needs Assistance Bed Mobility: Sit to Supine       Sit to supine: Min assist   General bed mobility comments: Pt needs assist for safety but she tends to refuse it. Observed to have significant difficulty getting back into bed.   Transfers Overall transfer level: Needs assistance Equipment used: Rolling walker (2 wheeled) Transfers: Sit to/from Stand Sit to Stand: Min assist         General transfer comment: Assist to control descent. VCs safety, technique, hand placement  Ambulation/Gait Ambulation/Gait assistance: Min assist Ambulation Distance (Feet): 40 Feet Assistive device: Rolling walker (2 wheeled) Gait Pattern/deviations: Step-to pattern;Decreased stride length;Antalgic     General Gait Details: Assist to stabilize. slow gait speed.    Stairs            Wheelchair Mobility    Modified Rankin (Stroke Patients Only)       Balance                                    Cognition Arousal/Alertness: Awake/alert Behavior During Therapy: WFL for tasks assessed/performed Overall Cognitive Status: Within Functional Limits for tasks assessed Area of Impairment: Memory;Safety/judgement     Memory:  Decreased short-term memory   Safety/Judgement: Decreased awareness of safety     General Comments: daughter attributes it to pain medication    Exercises      General Comments        Pertinent Vitals/Pain Pain Assessment: 0-10 Pain Score: 5  Pain Location: L hip/thigh Pain Descriptors / Indicators: Sore Pain Intervention(s): Monitored during session;Repositioned    Home Living Family/patient expects to be discharged to:: Private residence Living Arrangements: Alone Available Help at Discharge: Family Type of Home: House Home Access: Stairs to enter Entrance Stairs-Rails: None Home Layout: One level Home Equipment: Environmental consultantWalker - 2 wheels;Tub bench      Prior Function Level of Independence: Independent          PT Goals (current goals can now be found in the care plan section) Acute Rehab PT Goals Patient Stated Goal: home tomorrow Progress towards PT goals: Progressing toward goals    Frequency  7X/week    PT Plan Current plan remains appropriate    Co-evaluation             End of Session Equipment Utilized During Treatment: Gait belt Activity Tolerance: Patient tolerated treatment well Patient left: in bed;with call bell/phone within reach;with bed alarm set;with family/visitor present     Time: 1401-1410 PT Time Calculation (min) (ACUTE ONLY): 9 min  Charges:  $Gait Training: 8-22 mins                    G  Codes:      Weston Anna, MPT Pager: [phone]

## 2015-10-16 NOTE — Progress Notes (Addendum)
Occupational Therapy Evaluation Patient Details Name: [name] FaceDorothy A [name] MRN: [id number] DOB: 04/10/1924 Today's Date: 10/16/2015    History of Present Illness 82 yo female s/p L THA-direct anterior 10/15/15.    Clinical Impression   Patient presents to OT with decreased ADL independence and safety due to the deficits listed below. She will benefit from skilled OT to maximize function and to facilitate a safe discharge. OT will follow.    Follow Up Recommendations  Home health OT;Supervision/Assistance - 24 hour    Equipment Recommendations  None recommended by OT (pt has all DME)    Recommendations for Other Services       Precautions / Restrictions Precautions Precautions: Fall Restrictions Weight Bearing Restrictions: No LLE Weight Bearing: Weight bearing as tolerated      Mobility Bed Mobility            General bed mobility comments: NT -- up in recliner  Transfers Overall transfer level: Needs assistance Equipment used: Rolling walker (2 wheeled) Transfers: Sit to/from Stand Sit to Stand: Min assist        General transfer comment: Assist to rise, stabilize, control descent. VCs safety, technqiue, hand/LE placement.     Balance                                            ADL Overall ADL's : Needs assistance/impaired     Grooming: Wash/dry hands;Min guard;Standing   Upper Body Bathing: Minimal assitance;Sitting   Lower Body Bathing: Moderate assistance;Sit to/from stand   Upper Body Dressing : Minimal assistance;Sitting   Lower Body Dressing: Moderate assistance;Maximal assistance;Sit to/from stand   Toilet Transfer: Minimal assistance;Ambulation;Regular Toilet;BSC;RW   Toileting- Clothing Manipulation and Hygiene: Minimal assistance;Sit to/from stand       Functional mobility during ADLs: Minimal assistance;Cueing for safety;Cueing for sequencing;Rolling walker General ADL Comments: Patient unsteady upon initial stand  from recliner. Needed A to regain balance. Patient ambulated to/from bathroom and performed toileting/grooming tasks during session. Mildly confused during conversation. Daughter present during session.     Vision     Perception     Praxis      Pertinent Vitals/Pain Pain Assessment: 0-10 Pain Score: 5  Pain Location: L hip Pain Descriptors / Indicators: Sore Pain Intervention(s): Limited activity within patient's tolerance;Monitored during session;Repositioned     Hand Dominance     Extremity/Trunk Assessment Upper Extremity Assessment Upper Extremity Assessment: Overall WFL for tasks assessed   Lower Extremity Assessment Lower Extremity Assessment: Defer to PT evaluation   Cervical / Trunk Assessment Cervical / Trunk Assessment: Normal   Communication Communication Communication: No difficulties   Cognition Arousal/Alertness: Awake/alert Behavior During Therapy: WFL for tasks assessed/performed Overall Cognitive Status: Impaired/Different from baseline Area of Impairment: Memory;Safety/judgement     Memory: Decreased short-term memory   Safety/Judgement: Decreased awareness of safety     General Comments: daughter attributes it to pain medication   General Comments       Exercises       Shoulder Instructions      Home Living Family/patient expects to be discharged to:: Private residence Living Arrangements: Alone Available Help at Discharge: Family Type of Home: House Home Access: Stairs to enter Secretary/administratorntrance Stairs-Number of Steps: 2 Entrance Stairs-Rails: None Home Layout: One level     Bathroom Shower/Tub: Tub/shower unit Shower/tub characteristics: Engineer, building servicesCurtain Bathroom Toilet: Standard Bathroom Accessibility: Yes How Accessible: Accessible via walker  Home Equipment: Walker - 2 wheels;Tub bench          Prior Functioning/Environment Level of Independence: Independent             OT Diagnosis: Acute pain   OT Problem List: Decreased  strength;Decreased activity tolerance;Impaired balance (sitting and/or standing);Decreased knowledge of use of DME or AE;Pain   OT Treatment/Interventions: Self-care/ADL training;DME and/or AE instruction;Therapeutic activities;Patient/family education    OT Goals(Current goals can be found in the care plan section) Acute Rehab OT Goals Patient Stated Goal: home tomorrow OT Goal Formulation: With patient Time For Goal Achievement: 10/30/15 Potential to Achieve Goals: Good ADL Goals Pt Will Perform Lower Body Bathing: with min assist;sit to/from stand Pt Will Perform Lower Body Dressing: with min assist;sit to/from stand Pt Will Transfer to Toilet: with supervision;ambulating;bedside commode Pt Will Perform Toileting - Clothing Manipulation and hygiene: with supervision;sit to/from stand Pt Will Perform Tub/Shower Transfer: Tub transfer;with min assist;with caregiver independent in assisting;tub bench;rolling walker  OT Frequency: Min 2X/week   Barriers to D/C:            Co-evaluation              End of Session Equipment Utilized During Treatment: Engineer, water Communication: Mobility status  Activity Tolerance: Patient tolerated treatment well Patient left: Other (comment) (up walking with PT)   Time: 1610-9604 OT Time Calculation (min): 18 min Charges:  OT General Charges $OT Visit: 1 Procedure OT Evaluation $OT Eval Low Complexity: 1 Procedure G-Codes:    Melcher, Baby Stairs A 20-Oct-2015, 2:16 PM

## 2015-10-16 NOTE — Evaluation (Signed)
Physical Therapy Evaluation Patient Details Name: [name] FaceDorothy A Owensby MRN: [id number] DOB: 05/14/1923 Today's Date: 10/16/2015   History of Present Illness  82 yo female s/p L THA-direct anterior 10/15/15.   Clinical Impression  On eval, pt required Min assist for mobility. Pt did have some nausea and dizziness with mobility. She was able to eventually walk ~25 feet. BP 131/63 after short walk. Will follow and progress activity as tolerated.     Follow Up Recommendations Home health PT;No PT follow up;Supervision/Assistance - 24 hour (depending on progress)    Equipment Recommendations   (may need RW-pt is not sure)    Recommendations for Other Services       Precautions / Restrictions Precautions Precautions: Fall Restrictions Weight Bearing Restrictions: No LLE Weight Bearing: Weight bearing as tolerated      Mobility  Bed Mobility Overal bed mobility: Needs Assistance Bed Mobility: Supine to Sit     Supine to sit: HOB elevated;Min assist     General bed mobility comments: Increased time. small amount of assist to get to EOB. Pt c/o dizziness. Sat for several minutes before able to stand  Transfers Overall transfer level: Needs assistance Equipment used: Rolling walker (2 wheeled) Transfers: Sit to/from UGI CorporationStand;Stand Pivot Transfers Sit to Stand: Min assist Stand pivot transfers: Min assist       General transfer comment: Assist to rise, stabilize, control descent. VCs safety, technqiue, hand/LE placement. Stand pivot, bed to recliner, with RW before attempting to walk  Ambulation/Gait Ambulation/Gait assistance: Min assist Ambulation Distance (Feet): 25 Feet Assistive device: Rolling walker (2 wheeled) Gait Pattern/deviations: Step-to pattern;Trunk flexed;Antalgic     General Gait Details: Assist to stabilize. Followed closely with recliner.   Stairs            Wheelchair Mobility    Modified Rankin (Stroke Patients Only)       Balance                                             Pertinent Vitals/Pain Pain Assessment: 0-10 Pain Score: 5  Pain Location: L hip/thigh Pain Descriptors / Indicators: Sore Pain Intervention(s): Monitored during session;Repositioned    Home Living Family/patient expects to be discharged to:: Private residence Living Arrangements: Alone Available Help at Discharge: Family Type of Home: House Home Access: Stairs to enter Entrance Stairs-Rails: None Secretary/administratorntrance Stairs-Number of Steps: 2 Home Layout: One level Home Equipment: Environmental consultantWalker - 2 wheels      Prior Function Level of Independence: Independent               Hand Dominance        Extremity/Trunk Assessment   Upper Extremity Assessment: Defer to OT evaluation           Lower Extremity Assessment: Generalized weakness      Cervical / Trunk Assessment: Normal  Communication   Communication: No difficulties  Cognition Arousal/Alertness: Awake/alert Behavior During Therapy: WFL for tasks assessed/performed Overall Cognitive Status: Within Functional Limits for tasks assessed                      General Comments      Exercises Total Joint Exercises Ankle Circles/Pumps: AROM;Both;10 reps;Supine Quad Sets: AROM;Both;10 reps;Supine Heel Slides: AAROM;Left;10 reps;Supine Hip ABduction/ADduction: AAROM;Left;10 reps;Supine      Assessment/Plan    PT Assessment Patient needs continued PT services  PT Diagnosis  Difficulty walking;Generalized weakness;Acute pain   PT Problem List Decreased activity tolerance;Decreased balance;Decreased mobility;Pain;Decreased knowledge of use of DME  PT Treatment Interventions DME instruction;Gait training;Functional mobility training;Therapeutic activities;Patient/family education;Balance training;Therapeutic exercise   PT Goals (Current goals can be found in the Care Plan section) Acute Rehab PT Goals Patient Stated Goal: home tomorrow PT Goal Formulation: With  patient Time For Goal Achievement: 10/30/15 Potential to Achieve Goals: Good    Frequency 7X/week   Barriers to discharge        Co-evaluation               End of Session Equipment Utilized During Treatment: Gait belt Activity Tolerance: Patient tolerated treatment well Patient left: in chair;with call bell/phone within reach;with chair alarm set           Time: 1027-2536 PT Time Calculation (min) (ACUTE ONLY): 20 min   Charges:   PT Evaluation $PT Eval Low Complexity: 1 Procedure     PT G Codes:        [name], MPT Pager: [phone]

## 2015-10-16 NOTE — Progress Notes (Signed)
     Subjective: 1 Day Post-Op Procedure(s) (LRB): LEFT TOTAL HIP ARTHROPLASTY ANTERIOR APPROACH (Left)   Seen by Dr. Charlann Boxerlin. Patient reports pain as mild, pain controlled. No events throughout the night.  Objective:   VITALS:   Filed Vitals:   10/16/15 0133 10/16/15 0621  BP: 105/68 95/46  Pulse: 66 63  Temp: 97.6 F (36.4 C) 97.7 F (36.5 C)  Resp: 18 18    Dorsiflexion/Plantar flexion intact Incision: dressing C/D/I No cellulitis present Compartment soft  LABS  Recent Labs  10/16/15 0423  HGB 11.4*  HCT 34.3*  WBC 15.4*  PLT 127*     Recent Labs  10/16/15 0423  NA 138  K 4.4  BUN 13  CREATININE 0.65  GLUCOSE 140*     Assessment/Plan: 1 Day Post-Op Procedure(s) (LRB): LEFT TOTAL HIP ARTHROPLASTY ANTERIOR APPROACH (Left) Foley cath d/c'ed Advance diet Up with therapy D/C IV fluids Discharge home with home health, eventually when ready  Overweight (BMI 25-29.9) Estimated body mass index is 25.05 kg/(m^2) as calculated from the following:   Height as of this encounter: 5\' 2"  (1.575 m).   Weight as of this encounter: 62.143 kg (137 lb). Patient also counseled that weight may inhibit the healing process Patient counseled that losing weight will help with future health issues        [name] [name]   PAC  10/16/2015, 8:24 AM

## 2015-10-16 NOTE — Care Management Note (Signed)
Case Management Note  Patient Details  Name: ANJALEE COPE MRN: [id number] Date of Birth: 07-30-1923  Subjective/Objective:                  LEFT TOTAL HIP ARTHROPLASTY ANTERIOR APPROACH (Left)  Action/Plan: Discharge planning Expected Discharge Date:  10/17/15               Expected Discharge Plan:  Home/Self Care  In-House Referral:     Discharge planning Services  CM Consult  Post Acute Care Choice:    Choice offered to:  Patient  DME Arranged:  N/A DME Agency:  NA  HH Arranged:  PT [place] Agency:  Other - See comment  Status of Service:  Completed, signed off  Medicare Important Message Given:    Date Medicare IM Given:    Medicare IM give by:    Date Additional Medicare IM Given:    Additional Medicare Important Message give by:     If discussed at [place] of Stay Meetings, dates discussed:    Additional Comments: Cm met with pt to confirm plan is for outpt PT; pt confirms.  Pt states she had a R hip 2 years ago and has all DME.  No other CM needs were communicated. Dellie Catholic, RN 10/16/2015, 11:00 AM

## 2015-10-17 LAB — BASIC METABOLIC PANEL
Anion gap: 4 — ABNORMAL LOW (ref 5–15)
BUN: 14 mg/dL (ref 6–20)
CO2: 25 mmol/L (ref 22–32)
Calcium: 8 mg/dL — ABNORMAL LOW (ref 8.9–10.3)
Chloride: 107 mmol/L (ref 101–111)
Creatinine, Ser: 0.67 mg/dL (ref 0.44–1.00)
GFR calc Af Amer: >60 mL/min (ref >60)
GFR calc non Af Amer: >60 mL/min (ref >60)
Glucose, Bld: 107 mg/dL — ABNORMAL HIGH (ref 65–99)
Potassium: 4.4 mmol/L (ref 3.5–5.1)
Sodium: 136 mmol/L (ref 135–145)

## 2015-10-17 LAB — CBC
HCT: 32.1 % — ABNORMAL LOW (ref 36.0–46.0)
Hemoglobin: 10.7 g/dL — ABNORMAL LOW (ref 12.0–15.0)
MCH: 31.3 pg (ref 26.0–34.0)
MCHC: 33.3 g/dL (ref 30.0–36.0)
MCV: 93.9 fL (ref 78.0–100.0)
Platelets: 106 10*3/uL — ABNORMAL LOW (ref 150–400)
RBC: 3.42 MIL/uL — ABNORMAL LOW (ref 3.87–5.11)
RDW: 12.6 % (ref 11.5–15.5)
WBC: 13.5 10*3/uL — ABNORMAL HIGH (ref 4.0–10.5)

## 2015-10-17 MED ORDER — TRAMADOL HCL 50 MG PO TABS
50.0000 mg | ORAL_TABLET | Freq: Four times a day (QID) | ORAL | Status: DC
  Administered 2015-10-17: 50 mg via ORAL
  Administered 2015-10-17: 100 mg via ORAL
  Administered 2015-10-17 – 2015-10-18 (×3): 50 mg via ORAL
  Filled 2015-10-17: qty 2
  Filled 2015-10-17 (×4): qty 1

## 2015-10-17 MED ORDER — SODIUM CHLORIDE 0.9 % IV BOLUS (SEPSIS)
500.0000 mL | Freq: Once | INTRAVENOUS | Status: AC
  Administered 2015-10-17: 500 mL via INTRAVENOUS

## 2015-10-17 MED ORDER — ACETAMINOPHEN 500 MG PO TABS
1000.0000 mg | ORAL_TABLET | Freq: Three times a day (TID) | ORAL | Status: DC
  Administered 2015-10-17 – 2015-10-18 (×2): 1000 mg via ORAL
  Filled 2015-10-17 (×5): qty 2

## 2015-10-17 NOTE — Progress Notes (Signed)
Occupational Therapy Treatment Patient Details Name: [name] FaceDorothy A [name] MRN: [id number] DOB: 02/09/1924 Today's Date: 10/17/2015    History of present illness 82 yo female s/p L THA-direct anterior 10/15/15.    OT comments  Pt not safe to DC home . Pt only have 1-2 days of A.  Pt will need HH or ST SNF.  Follow Up Recommendations  Home health OT;Supervision/Assistance - 24 hour    Equipment Recommendations  3 in 1 bedside comode    Recommendations for Other Services      Precautions / Restrictions Precautions Precautions: Fall Restrictions Weight Bearing Restrictions: No LLE Weight Bearing: Weight bearing as tolerated       Mobility Bed Mobility Overal bed mobility: Needs Assistance Bed Mobility: Supine to Sit     Supine to sit: HOB elevated;Min guard     General bed mobility comments: pt in chair  Transfers Overall transfer level: Needs assistance Equipment used: Rolling walker (2 wheeled) Transfers: Sit to/from UGI CorporationStand;Stand Pivot Transfers Sit to Stand: Min assist Stand pivot transfers: Min assist       General transfer comment: Assist to rise, stabilize, control descent. VCs safety, technique, hand placement. Pt c/o dizziness.         ADL Overall ADL's : Needs assistance/impaired                     Lower Body Dressing: Minimal assistance;Sit to/from stand;Cueing for safety   Toilet Transfer: Minimal assistance;Ambulation;Regular Toilet;RW   Toileting- Clothing Manipulation and Hygiene: Minimal assistance;Sit to/from stand         General ADL Comments: Concerns about this pt going home. Pt only have A for a few days from daugther- pts husband lives in a SNF. Pt feel she needs further rehab.                Cognition   Behavior During Therapy: WFL for tasks assessed/performed Overall Cognitive Status: Impaired/Different from baseline (pt aware she has been confused at times)                                    Pertinent  Vitals/ Pain       Pain Assessment: 0-10 Pain Score: 4  Pain Location: L hip Pain Descriptors / Indicators: Sore Pain Intervention(s): Repositioned  Home Living                                              Frequency Min 2X/week     Progress Toward Goals  OT Goals(current goals can now be found in the care plan section)  Progress towards OT goals: Progressing toward goals     Plan Discharge plan remains appropriate    Co-evaluation                 End of Session Equipment Utilized During Treatment: Rolling walker   Activity Tolerance Patient tolerated treatment well   Patient Left Other (comment) (up walking with PT)   Nurse Communication Mobility status        Time: 4098-11911215-1224 OT Time Calculation (min): 9 min  Charges: OT General Charges $OT Visit: 1 Procedure OT Treatments $Self Care/Home Management : 8-22 mins  Makara Lanzo D 10/17/2015, 1:09 PM

## 2015-10-17 NOTE — Progress Notes (Signed)
Physical Therapy Treatment Patient Details Name: [name] [name] MRN: [id number] DOB: August 17, 1923 Today's Date: 10/17/2015    History of Present Illness 82 yo female s/p L THA-direct anterior 10/15/15.     PT Comments    Not progressing well with mobility. Pt c/o "dizziness" "feeling clammy"  every time she gets OOB/attempts ambulation. BP ~115/50s each time after ambulation today. Pt has only been able to walk ~40 feet at any one time. Concerned for safety and risk for falls for above reason. Unsure if pt is safe to d/c today, at this time-made RN aware. Will have a 2nd session later today.   Follow Up Recommendations  Home health PT;Supervision/Assistance - 24 hour     Equipment Recommendations  None recommended by PT    Recommendations for Other Services       Precautions / Restrictions Precautions Precautions: Fall Restrictions Weight Bearing Restrictions: No LLE Weight Bearing: Weight bearing as tolerated    Mobility  Bed Mobility Overal bed mobility: Needs Assistance Bed Mobility: Supine to Sit     Supine to sit: HOB elevated;Min guard     General bed mobility comments: close guard for safety. Increased time. Pt prefers to perform task unaassisted.  Transfers Overall transfer level: Needs assistance Equipment used: Rolling walker (2 wheeled) Transfers: Sit to/from Stand Sit to Stand: Min assist Stand pivot transfers: Min assist       General transfer comment: Assist to rise, stabilize, control descent. VCs safety, technique, hand placement. Pt c/o dizziness.   Ambulation/Gait Ambulation/Gait assistance: Min assist Ambulation Distance (Feet): 40 Feet (40'x1, 10'x1) Assistive device: Rolling walker (2 wheeled) Gait Pattern/deviations: Step-to pattern;Decreased stride length;Antalgic     General Gait Details: Assist to stabilize. slow gait speed. Followed with recliner on 2nd walk-had to sit after 10 feet. Pt c/o "dizziness", "feeling clammy".    Stairs            Wheelchair Mobility    Modified Rankin (Stroke Patients Only)       Balance                                    Cognition Arousal/Alertness: Awake/[name] Behavior During Therapy: WFL for tasks assessed/performed Overall Cognitive Status: Within Functional Limits for tasks assessed (but having some difficulties at times-possibly due to pain meds)                      Exercises Total Joint Exercises Ankle Circles/Pumps: AROM;Both;10 reps;Supine Quad Sets: AROM;Both;10 reps;Supine Heel Slides: AAROM;Left;10 reps;Supine Hip ABduction/ADduction: AAROM;Left;10 reps;Supine    General Comments        Pertinent Vitals/Pain Pain Assessment: 0-10 Pain Score: 5  Pain Location: L hip/thigh with activity Pain Descriptors / Indicators: Sore Pain Intervention(s): Monitored during session;Repositioned    Home Living                      Prior Function            PT Goals (current goals can now be found in the care plan section) Progress towards PT goals: Not progressing toward goals - comment (limited by dizziness)    Frequency  7X/week    PT Plan Current plan remains appropriate    Co-evaluation             End of Session Equipment Utilized During Treatment: Gait belt Activity Tolerance:  (Limited by dizziness) Patient left:  in chair;with call bell/phone within reach;with chair alarm set     Time: [phone] PT Time Calculation (min) (ACUTE ONLY): 29 min  Charges:  $Gait Training: 8-22 mins $Therapeutic Exercise: 8-22 mins                    G Codes:      [name] AlertJannie Ercil [name], MPT Pager: [phone]

## 2015-10-17 NOTE — Progress Notes (Signed)
     Subjective: 2 Days Post-Op Procedure(s) (LRB): LEFT TOTAL HIP ARTHROPLASTY ANTERIOR APPROACH (Left)   Patient reports pain as none while sitting.  Does states that the pain increases with weight bearing.  She is a bit confused, feels that she knows she is, but then repeats things multiple times.  Feels that the medication is effecting her and we discussed changing to tramadol, she said that less is better.  She has also had hypotension and discussed giving her some fluids.  H&H is doing well.   Objective:   VITALS:   Filed Vitals:   10/16/15 2121 10/17/15 0607  BP: 129/54 94/50  Pulse: 79 72  Temp: 98.1 F (36.7 C) 98.9 F (37.2 C)  Resp: 16 15    Dorsiflexion/Plantar flexion intact Incision: dressing C/D/I No cellulitis present Compartment soft  LABS  Recent Labs  10/16/15 0423 10/17/15 0437  HGB 11.4* 10.7*  HCT 34.3* 32.1*  WBC 15.4* 13.5*  PLT 127* 106*     Recent Labs  10/16/15 0423 10/17/15 0437  NA 138 136  K 4.4 4.4  BUN 13 14  CREATININE 0.65 0.67  GLUCOSE 140* 107*     Assessment/Plan: 2 Days Post-Op Procedure(s) (LRB): LEFT TOTAL HIP ARTHROPLASTY ANTERIOR APPROACH (Left) Changed from Norco to tramadol and APAP 500cc NS bolus to help with hypotension Up with therapy Discharge home with home health eventually when ready     Anastasio AuerbachMatthew S. Kennth Vanbenschoten   PAC  10/17/2015, 11:38 AM

## 2015-10-17 NOTE — Progress Notes (Signed)
Physical Therapy Treatment Patient Details Name: [name] FaceDorothy A [name] MRN: [id number] DOB: 04/18/1924 Today's Date: 10/17/2015    History of Present Illness 82 yo female s/p L THA-direct anterior 10/15/15.     PT Comments    Progressing slowly with mobility. Pt continues to c/o dizziness but she was able to increase ambulation distance this session. Will need to practice stair negotiation on tomorrow-possible d/c home. Highly recommend 24 hour supervision/assist due to risk for falls.   Follow Up Recommendations  Home health PT;Supervision/Assistance - 24 hour     Equipment Recommendations  None recommended by PT    Recommendations for Other Services       Precautions / Restrictions Precautions Precautions: Fall Restrictions Weight Bearing Restrictions: No LLE Weight Bearing: Weight bearing as tolerated    Mobility  Bed Mobility Overal bed mobility: Needs Assistance Bed Mobility: Sit to Supine       Sit to supine: Min guard;HOB elevated   General bed mobility comments: close guard for safety. Increased time. Pt prefers to perform unassisted.   Transfers Overall transfer level: Needs assistance Equipment used: Rolling walker (2 wheeled) Transfers: Sit to/from Stand Sit to Stand: Min assist Stand pivot transfers: Min assist       General transfer comment: Assist to rise, stabilize, control descent. VCs safety, technique, hand placement. Pt c/o dizziness.   Ambulation/Gait Ambulation/Gait assistance: Min assist Ambulation Distance (Feet): 85 Feet Assistive device: Rolling walker (2 wheeled) Gait Pattern/deviations: Step-to pattern;Decreased stride length     General Gait Details: Assist to stabilize. slow gait speed. Followed with recliner.Pt c/o dizziness during first 15-20 feet. This lessened and pt was able to continue ambulation.   Stairs            Wheelchair Mobility    Modified Rankin (Stroke Patients Only)       Balance                                     Cognition Arousal/Alertness: Awake/alert Behavior During Therapy: WFL for tasks assessed/performed Overall Cognitive Status: Within Functional Limits for tasks assessed (some confusion at times-pt aware)                      Exercises      General Comments        Pertinent Vitals/Pain Pain Assessment: 0-10 Pain Score: 4  Pain Location: L hip/thigh Pain Descriptors / Indicators: Sore Pain Intervention(s): Monitored during session;Repositioned    Home Living                      Prior Function            PT Goals (current goals can now be found in the care plan section) Progress towards PT goals: Progressing toward goals    Frequency  7X/week    PT Plan Current plan remains appropriate    Co-evaluation             End of Session Equipment Utilized During Treatment: Gait belt Activity Tolerance: Patient tolerated treatment well Patient left: in bed;with call bell/phone within reach;with family/visitor present;with bed alarm set     Time: 1348-1416 PT Time Calculation (min) (ACUTE ONLY): 28 min  Charges:  $Gait Training: 8-22 mins                    G Codes:  Weston Anna, MPT Pager: [phone]

## 2015-10-18 MED ORDER — TRAMADOL HCL 50 MG PO TABS: 50.0000 mg | ORAL_TABLET | Freq: Four times a day (QID) | ORAL | Status: DC | PRN

## 2015-10-18 MED ORDER — ACETAMINOPHEN 500 MG PO TABS: 1000.0000 mg | ORAL_TABLET | Freq: Three times a day (TID) | ORAL | Status: DC

## 2015-10-18 MED ORDER — ASPIRIN 325 MG PO TBEC: 325.0000 mg | DELAYED_RELEASE_TABLET | Freq: Two times a day (BID) | ORAL | Status: AC

## 2015-10-18 NOTE — Progress Notes (Signed)
     Subjective: 3 Days Post-Op Procedure(s) (LRB): LEFT TOTAL HIP ARTHROPLASTY ANTERIOR APPROACH (Left)   Patient reports pain as mild, pain controlled.  Tolerating tramadol well.  No events throughout the night.  Does states that even though it was better she still was will little dizzy with her last PT session. Encouraged her to stay well hydrated to replenish her volume, but her BP is better.  Ready to be discharged home.  Objective:   VITALS:   Filed Vitals:   10/17/15 2103 10/18/15 0542  BP: 142/64 104/60  Pulse: 82 56  Temp: 97.7 F (36.5 C) 98.8 F (37.1 C)  Resp: 18 16    Dorsiflexion/Plantar flexion intact Incision: dressing C/D/I No cellulitis present Compartment soft  LABS  Recent Labs  10/16/15 0423 10/17/15 0437  HGB 11.4* 10.7*  HCT 34.3* 32.1*  WBC 15.4* 13.5*  PLT 127* 106*     Recent Labs  10/16/15 0423 10/17/15 0437  NA 138 136  K 4.4 4.4  BUN 13 14  CREATININE 0.65 0.67  GLUCOSE 140* 107*     Assessment/Plan: 3 Days Post-Op Procedure(s) (LRB): LEFT TOTAL HIP ARTHROPLASTY ANTERIOR APPROACH (Left) Up with therapy Discharge home with home health  [facility]. Follow up with OLIN,Lyndia Bury D in 2 weeks.  Contact information:  [facility] [address], [address] [place] [id number] [phone]        Anastasio AuerbachMatthew S. Saraphina Lauderbaugh   PAC  10/18/2015, 8:55 AM

## 2015-10-18 NOTE — Progress Notes (Signed)
Occupational Therapy Treatment Patient Details Name: [name] FaceDorothy A [name] MRN: [id number] DOB: 03/01/1924 Today's Date: 10/18/2015    History of present illness 82 yo female s/p L THA-direct anterior 10/15/15.    OT comments  Pt much improved this day  Follow Up Recommendations  Home health OT;Supervision/Assistance - 24 hour    Equipment Recommendations  3 in 1 bedside comode       Precautions / Restrictions Restrictions Weight Bearing Restrictions: No LLE Weight Bearing: Weight bearing as tolerated       Mobility Bed Mobility Overal bed mobility: Modified Independent                Transfers Overall transfer level: Needs assistance Equipment used: Rolling walker (2 wheeled) Transfers: Sit to/from BJ'sStand;Stand Pivot Transfers Sit to Stand: Supervision Stand pivot transfers: Supervision                ADL Overall ADL's : Needs assistance/impaired             Lower Body Bathing: Set up;Sit to/from stand;Cueing for sequencing;Cueing for safety;Cueing for compensatory techniques   Upper Body Dressing : Set up;Sitting   Lower Body Dressing: Sit to/from stand;Cueing for sequencing;Cueing for safety;Cueing for compensatory techniques   Toilet Transfer: Supervision/safety;RW;Ambulation;Regular Toilet   Toileting- Clothing Manipulation and Hygiene: Supervision/safety;Sit to/from stand;Cueing for sequencing;Cueing for compensatory techniques;Cueing for safety     Tub/Shower Transfer Details (indicate cue type and reason): verbalized safety Functional mobility during ADLs: Set up General ADL Comments: pt much better this day!                 Cognition   Behavior During Therapy: WFL for tasks assessed/performed Overall Cognitive Status: Within Functional Limits for tasks assessed                              Frequency Min 2X/week        Plan Discharge plan remains appropriate       End of Session Equipment Utilized During Treatment:  Rolling walker   Activity Tolerance Patient tolerated treatment well   Patient Left in chair;with call bell/phone within reach   Nurse Communication Mobility status        Time: 8469-62951031-1045 OT Time Calculation (min): 14 min  Charges: OT General Charges $OT Visit: 1 Procedure OT Treatments $Self Care/Home Management : 8-22 mins  Jeniyah Menor D 10/18/2015, 10:51 AM

## 2015-10-18 NOTE — Progress Notes (Signed)
Physical Therapy Treatment Patient Details Name: [name] [name] MRN: [id number] DOB: 12/01/1923 Today's Date: 10/18/2015    History of Present Illness 82 yo female s/p L THA-direct anterior 10/15/15.     PT Comments    POD # 3 Assisted pt OOB to amb and practice stairs with daughter.  Performed twice with 75% VC's.  Required sitting rest break then amb back to room to perform THR TE's following Handout HEP sheet.  Instructed on proper tech and freq plus use of ICE.   Follow Up Recommendations  Home health PT;Supervision/Assistance - 24 hour     Equipment Recommendations  None recommended by PT    Recommendations for Other Services       Precautions / Restrictions Precautions Precautions: Fall Restrictions Weight Bearing Restrictions: No LLE Weight Bearing: Weight bearing as tolerated    Mobility  Bed Mobility Overal bed mobility: Needs Assistance Bed Mobility: Supine to Sit;Sit to Supine     Supine to sit: Supervision Sit to supine: Supervision   General bed mobility comments: increased time but able to slide own leg off and onto bed  Transfers     Supervision with instruction to daughter on safe handling and VC's to pt on proper hand placement for increased safety and care giver management.                   Ambulation/Gait    MinGuard Assist with RW with daughter under direction of therapist limited distance due to fatigue.  VC's on safety with turns and backward gait to bed.              Stairs    up backward 2 steps no rails with walker with daughter "hands on" instruction.  Performed twice and handout also given.    handout also given    Wheelchair Mobility    Modified Rankin (Stroke Patients Only)       Balance                                    Cognition Arousal/Alertness: Awake/alert Behavior During Therapy: WFL for tasks assessed/performed Overall Cognitive Status: Within Functional Limits for tasks assessed                       Exercises   Total Hip Replacement TE's 10 reps ankle pumps 10 reps knee presses 10 reps heel slides 10 reps SAQ's 10 reps ABD Followed by ICE   General Comments        Pertinent Vitals/Pain Pain Assessment: 0-10 Pain Score: 4  Pain Location: L hip/thigh Pain Descriptors / Indicators: Sore;Tender Pain Intervention(s): Monitored during session;Repositioned;Ice applied    Home Living                      Prior Function            PT Goals (current goals can now be found in the care plan section) Progress towards PT goals: Progressing toward goals    Frequency  7X/week    PT Plan Current plan remains appropriate    Co-evaluation             End of Session Equipment Utilized During Treatment: Gait belt Activity Tolerance: Patient tolerated treatment well Patient left: in bed;with call bell/phone within reach;with family/visitor present;with bed alarm set     Time: 4098-11910918-1010 PT Time Calculation (min) (ACUTE ONLY):  52 min  Charges:  $Therapeutic Exercise: 8-22 mins $Therapeutic Activity: 8-22 mins                    G Codes:      Felecia Shelling  PTA [facility]  Acute  Rehab Pager      [phone]

## 2015-10-20 NOTE — Care Management Note (Signed)
Case Management Note  Patient Details  Name: [name] [name] MRN: [id number] Date of Birth: 11/22/1923  Subjective/Objective:  s/p L THA-direct anterior 10/15/15                  Action/Plan:  Received call from [facility] Liaison and dtr contacted ageny and pt is requesting HH PT. Made [facility] rep aware HH orders are in EPIC. [facility] will arrange for HHPT.   Expected Discharge Date:  10/18/2015             Expected Discharge Plan:  Home w Home Health Services  In-House Referral:  NA  Discharge planning Services  CM Consult  Post Acute Care Choice:  Home Health Choice offered to:  Patient  DME Arranged:  N/A DME Agency:  NA  HH Arranged:  PT HH Agency:  Other - See comment, [facility]  Status of Service:  Completed, signed off  Medicare Important Message Given:    Date Medicare IM Given:    Medicare IM give by:    Date Additional Medicare IM Given:    Additional Medicare Important Message give by:     If discussed at Long Length of Stay Meetings, dates discussed:    Additional Comments:  Elliot CousinShavis, Malini Flemings Ellen, RN 10/20/2015, 3:33 PM

## 2015-10-21 DIAGNOSIS — M810 Age-related osteoporosis without current pathological fracture: Secondary | ICD-10-CM | POA: Diagnosis not present

## 2015-10-21 DIAGNOSIS — M4800 Spinal stenosis, site unspecified: Secondary | ICD-10-CM | POA: Diagnosis not present

## 2015-10-21 DIAGNOSIS — M4120 Other idiopathic scoliosis, site unspecified: Secondary | ICD-10-CM | POA: Diagnosis not present

## 2015-10-21 DIAGNOSIS — M1990 Unspecified osteoarthritis, unspecified site: Secondary | ICD-10-CM | POA: Diagnosis not present

## 2015-10-21 DIAGNOSIS — H409 Unspecified glaucoma: Secondary | ICD-10-CM | POA: Diagnosis not present

## 2015-10-21 DIAGNOSIS — Z471 Aftercare following joint replacement surgery: Secondary | ICD-10-CM | POA: Diagnosis not present

## 2015-10-21 NOTE — Discharge Summary (Signed)
Physician Discharge Summary  Patient ID: [name] FaceDorothy A [name] MRN: [id number] DOB/AGE: 80/06/1923 80 y.o.  Admit date: 10/15/2015 Discharge date: 10/18/2015   Procedures:  Procedure(s) (LRB): LEFT TOTAL HIP ARTHROPLASTY ANTERIOR APPROACH (Left)  Attending Physician:  Dr. Durene RomansMatthew Olin   Admission Diagnoses:   Left hip primary OA / pain  Discharge Diagnoses:  Principal Problem:   S/P left THA, AA  Past Medical History  Diagnosis Date  . Arthritis   . Osteoporosis   . Frequency of urination   . Glaucoma   . Complication of anesthesia     pt states she had trouble waking up    HPI:    [name] [name], 82 y.o. female, has a history of pain and functional disability in the left hip(s) due to arthritis and patient has failed non-surgical conservative treatments for greater than 12 weeks to include NSAID's and/or analgesics and activity modification. Onset of symptoms was gradual starting 5+ months ago with rapidlly worsening course since that time.The patient noted prior procedures of the hip to include arthroplasty on the right hip per Dr. Charlann Boxerlin in 2015. Patient currently rates pain in the left hip at 10 out of 10 with activity. Patient has night pain, worsening of pain with activity and weight bearing, trendelenberg gait, pain that interfers with activities of daily living and pain with passive range of motion. Patient has evidence of periarticular osteophytes and joint space narrowing by imaging studies. This condition presents safety issues increasing the risk of falls. There is no current active infection. Risks, benefits and expectations were discussed with the patient. Risks including but not limited to the risk of anesthesia, blood clots, nerve damage, blood vessel damage, failure of the prosthesis, infection and up to and including death. Patient understand the risks, benefits and expectations and wishes to proceed with surgery.   PCP: Dwana MelenaZack Hall, MD   Discharged Condition:  good  Hospital Course:  Patient underwent the above stated procedure on 10/15/2015. Patient tolerated the procedure well and brought to the recovery room in good condition and subsequently to the floor.  POD #1 BP: 95/46 ; Pulse: 63 ; Temp: 97.7 F (36.5 C) ; Resp: 18 Patient reports pain as mild, pain controlled. No events throughout the night. Some dizziness reported with PT. Dorsiflexion/plantar flexion intact, incision: dressing C/D/I, no cellulitis present and compartment soft.   LABS  Basename    HGB     11.4  HCT     34.3   POD #2  BP: 94/50 ; Pulse: 72 ; Temp: 98.9 F (37.2 C) ; Resp: 15 Patient reports pain as none while sitting. Does states that the pain increases with weight bearing. She is a bit confused, feels that she knows she is, but then repeats things multiple times. Feels that the medication is effecting her and we discussed changing to tramadol, she said that less is better. She has also had hypotension and discussed giving her some fluids. H&H is doing well.  Dorsiflexion/plantar flexion intact, incision: dressing C/D/I, no cellulitis present and compartment soft.   LABS  Basename    HGB     10.7  HCT     32.1   POD #3  BP: 104/60 ; Pulse: 56 ; Temp: 98.8 F (37.1 C) ; Resp: 16 Patient reports pain as mild, pain controlled. Tolerating tramadol well. No events throughout the night. Does states that even though it was better she still was will little dizzy with her last PT session. Encouraged her to  stay well hydrated to replenish her volume, but her BP is better. Ready to be discharged home. Dorsiflexion/plantar flexion intact, incision: dressing C/D/I, no cellulitis present and compartment soft.   LABS   No new labs  Discharge Exam: General appearance: alert, cooperative and no distress Extremities: Homans sign is negative, no sign of DVT, no edema, redness or tenderness in the calves or thighs and no ulcers, gangrene or trophic changes  Disposition:  Home with follow up in 2 weeks   Follow-up Information    Follow up with Shelda Pal, MD. Schedule an appointment as soon as possible for a visit in 2 weeks.   Specialty:  Orthopedic Surgery   Contact information:   [address] [place] [address] [phone]       Follow up with [facility].   Why:  home health physical therapy   Contact information:   [address] [place] [address] [phone]       Discharge Instructions    Call MD / Call 911    Complete by:  As directed   If you experience chest pain or shortness of breath, CALL 911 and be transported to the hospital emergency room.  If you develope a fever above 101 F, pus (white drainage) or increased drainage or redness at the wound, or calf pain, call your surgeon's office.     Change dressing    Complete by:  As directed   Maintain surgical dressing until follow up in the clinic. If the edges start to pull up, may reinforce with tape. If the dressing is no longer working, may remove and cover with gauze and tape, but must keep the area dry and clean.  Call with any questions or concerns.     Constipation Prevention    Complete by:  As directed   Drink plenty of fluids.  Prune juice may be helpful.  You may use a stool softener, such as Colace (over the counter) 100 mg twice a day.  Use MiraLax (over the counter) for constipation as needed.     Diet - low sodium heart healthy    Complete by:  As directed      Discharge instructions    Complete by:  As directed   Maintain surgical dressing until follow up in the clinic. If the edges start to pull up, may reinforce with tape. If the dressing is no longer working, may remove and cover with gauze and tape, but must keep the area dry and clean.  Follow up in 2 weeks at [facility]. Call with any questions or concerns.     Increase activity slowly as tolerated    Complete by:  As directed   Weight bearing as  tolerated with assist device (walker, cane, etc) as directed, use it as long as suggested by your surgeon or therapist, typically at least 4-6 weeks.     TED hose    Complete by:  As directed   Use stockings (TED hose) for 2 weeks on both leg(s).  You may remove them at night for sleeping.             Medication List    STOP taking these medications        meloxicam 15 MG tablet  Commonly known as:  MOBIC      TAKE these medications        acetaminophen 500 MG tablet  Commonly known as:  TYLENOL  Take 2 tablets (  1,000 mg total) by mouth every 8 (eight) hours.     aspirin 325 MG EC tablet  Take 1 tablet (325 mg total) by mouth 2 (two) times daily.     docusate sodium 100 MG capsule  Commonly known as:  COLACE  Take 1 capsule (100 mg total) by mouth 2 (two) times daily.     ferrous sulfate 325 (65 FE) MG tablet  Take 1 tablet (325 mg total) by mouth 3 (three) times daily after meals.     polyethylene glycol packet  Commonly known as:  MIRALAX / GLYCOLAX  Take 17 g by mouth 2 (two) times daily.     tiZANidine 4 MG tablet  Commonly known as:  ZANAFLEX  Take 1 tablet (4 mg total) by mouth every 6 (six) hours as needed for muscle spasms.     traMADol 50 MG tablet  Commonly known as:  ULTRAM  Take 1-2 tablets (50-100 mg total) by mouth every 6 (six) hours as needed.         Signed: Anastasio Auerbach. Carlina Derks   PA-C  10/21/2015, 2:31 PM

## 2015-10-24 DIAGNOSIS — M1990 Unspecified osteoarthritis, unspecified site: Secondary | ICD-10-CM | POA: Diagnosis not present

## 2015-10-24 DIAGNOSIS — M810 Age-related osteoporosis without current pathological fracture: Secondary | ICD-10-CM | POA: Diagnosis not present

## 2015-10-24 DIAGNOSIS — Z471 Aftercare following joint replacement surgery: Secondary | ICD-10-CM | POA: Diagnosis not present

## 2015-10-24 DIAGNOSIS — M4800 Spinal stenosis, site unspecified: Secondary | ICD-10-CM | POA: Diagnosis not present

## 2015-10-24 DIAGNOSIS — M4120 Other idiopathic scoliosis, site unspecified: Secondary | ICD-10-CM | POA: Diagnosis not present

## 2015-10-24 DIAGNOSIS — H409 Unspecified glaucoma: Secondary | ICD-10-CM | POA: Diagnosis not present

## 2015-10-28 DIAGNOSIS — H409 Unspecified glaucoma: Secondary | ICD-10-CM | POA: Diagnosis not present

## 2015-10-28 DIAGNOSIS — M1990 Unspecified osteoarthritis, unspecified site: Secondary | ICD-10-CM | POA: Diagnosis not present

## 2015-10-28 DIAGNOSIS — M4800 Spinal stenosis, site unspecified: Secondary | ICD-10-CM | POA: Diagnosis not present

## 2015-10-28 DIAGNOSIS — Z471 Aftercare following joint replacement surgery: Secondary | ICD-10-CM | POA: Diagnosis not present

## 2015-10-28 DIAGNOSIS — M4120 Other idiopathic scoliosis, site unspecified: Secondary | ICD-10-CM | POA: Diagnosis not present

## 2015-10-28 DIAGNOSIS — M810 Age-related osteoporosis without current pathological fracture: Secondary | ICD-10-CM | POA: Diagnosis not present

## 2015-10-30 DIAGNOSIS — M4800 Spinal stenosis, site unspecified: Secondary | ICD-10-CM | POA: Diagnosis not present

## 2015-10-30 DIAGNOSIS — M1990 Unspecified osteoarthritis, unspecified site: Secondary | ICD-10-CM | POA: Diagnosis not present

## 2015-10-30 DIAGNOSIS — M810 Age-related osteoporosis without current pathological fracture: Secondary | ICD-10-CM | POA: Diagnosis not present

## 2015-10-30 DIAGNOSIS — Z96642 Presence of left artificial hip joint: Secondary | ICD-10-CM | POA: Diagnosis not present

## 2015-10-30 DIAGNOSIS — Z471 Aftercare following joint replacement surgery: Secondary | ICD-10-CM | POA: Diagnosis not present

## 2015-10-30 DIAGNOSIS — M4120 Other idiopathic scoliosis, site unspecified: Secondary | ICD-10-CM | POA: Diagnosis not present

## 2015-10-30 DIAGNOSIS — H409 Unspecified glaucoma: Secondary | ICD-10-CM | POA: Diagnosis not present

## 2015-11-05 DIAGNOSIS — M4800 Spinal stenosis, site unspecified: Secondary | ICD-10-CM | POA: Diagnosis not present

## 2015-11-05 DIAGNOSIS — H409 Unspecified glaucoma: Secondary | ICD-10-CM | POA: Diagnosis not present

## 2015-11-05 DIAGNOSIS — Z471 Aftercare following joint replacement surgery: Secondary | ICD-10-CM | POA: Diagnosis not present

## 2015-11-05 DIAGNOSIS — M1990 Unspecified osteoarthritis, unspecified site: Secondary | ICD-10-CM | POA: Diagnosis not present

## 2015-11-05 DIAGNOSIS — M810 Age-related osteoporosis without current pathological fracture: Secondary | ICD-10-CM | POA: Diagnosis not present

## 2015-11-05 DIAGNOSIS — M4120 Other idiopathic scoliosis, site unspecified: Secondary | ICD-10-CM | POA: Diagnosis not present

## 2015-11-27 DIAGNOSIS — Z471 Aftercare following joint replacement surgery: Secondary | ICD-10-CM | POA: Diagnosis not present

## 2015-11-27 DIAGNOSIS — Z96642 Presence of left artificial hip joint: Secondary | ICD-10-CM | POA: Diagnosis not present

## 2015-12-07 DIAGNOSIS — D649 Anemia, unspecified: Secondary | ICD-10-CM | POA: Diagnosis not present

## 2015-12-07 DIAGNOSIS — J069 Acute upper respiratory infection, unspecified: Secondary | ICD-10-CM | POA: Diagnosis not present

## 2015-12-07 DIAGNOSIS — M542 Cervicalgia: Secondary | ICD-10-CM | POA: Diagnosis not present

## 2015-12-07 DIAGNOSIS — Z23 Encounter for immunization: Secondary | ICD-10-CM | POA: Diagnosis not present

## 2015-12-07 DIAGNOSIS — L84 Corns and callosities: Secondary | ICD-10-CM | POA: Diagnosis not present

## 2015-12-07 DIAGNOSIS — R531 Weakness: Secondary | ICD-10-CM | POA: Diagnosis not present

## 2015-12-27 ENCOUNTER — Other Ambulatory Visit: Payer: Self-pay

## 2016-01-08 DIAGNOSIS — M1612 Unilateral primary osteoarthritis, left hip: Secondary | ICD-10-CM | POA: Diagnosis not present

## 2016-01-08 DIAGNOSIS — Z96642 Presence of left artificial hip joint: Secondary | ICD-10-CM | POA: Diagnosis not present

## 2016-01-08 DIAGNOSIS — Z471 Aftercare following joint replacement surgery: Secondary | ICD-10-CM | POA: Diagnosis not present

## 2016-04-21 DIAGNOSIS — N390 Urinary tract infection, site not specified: Secondary | ICD-10-CM | POA: Diagnosis not present

## 2016-05-12 DIAGNOSIS — H401131 Primary open-angle glaucoma, bilateral, mild stage: Secondary | ICD-10-CM | POA: Diagnosis not present

## 2016-07-06 DIAGNOSIS — M25512 Pain in left shoulder: Secondary | ICD-10-CM | POA: Diagnosis not present

## 2016-07-31 ENCOUNTER — Emergency Department (HOSPITAL_COMMUNITY)
Admission: EM | Admit: 2016-07-31 | Discharge: 2016-07-31 | Disposition: A | Discharge status: Home / Self Care | Payer: Medicare Other | Attending: Emergency Medicine | Admitting: Emergency Medicine

## 2016-07-31 ENCOUNTER — Encounter (HOSPITAL_COMMUNITY): Payer: Self-pay | Admitting: Emergency Medicine

## 2016-07-31 DIAGNOSIS — Y999 Unspecified external cause status: Secondary | ICD-10-CM | POA: Diagnosis not present

## 2016-07-31 DIAGNOSIS — Y939 Activity, unspecified: Secondary | ICD-10-CM | POA: Diagnosis not present

## 2016-07-31 DIAGNOSIS — S40861A Insect bite (nonvenomous) of right upper arm, initial encounter: Secondary | ICD-10-CM | POA: Diagnosis not present

## 2016-07-31 DIAGNOSIS — W57XXXA Bitten or stung by nonvenomous insect and other nonvenomous arthropods, initial encounter: Secondary | ICD-10-CM | POA: Diagnosis not present

## 2016-07-31 DIAGNOSIS — Y929 Unspecified place or not applicable: Secondary | ICD-10-CM | POA: Diagnosis not present

## 2016-07-31 MED ORDER — DOXYCYCLINE HYCLATE 100 MG PO CAPS: 100.0000 mg | ORAL_CAPSULE | Freq: Two times a day (BID) | ORAL | 0 refills | Status: DC

## 2016-07-31 NOTE — ED Notes (Signed)
Pt reports that she has had lyme disease in the past, and was recently bitten by a tick- She is here to antibiotics to prevent lyme disease per her report

## 2016-07-31 NOTE — Discharge Instructions (Signed)
See your Physician for recheck in 1 week  °

## 2016-07-31 NOTE — ED Triage Notes (Signed)
Patient states she found a tick on her right axillary area today. States history of limes disease from previous tick bite.

## 2016-07-31 NOTE — ED Provider Notes (Signed)
[facility]-EM[facility]Y DEPT Provider Note   CSN: [id number] Arrival date & time: 07/31/16  1527     History   Chief Complaint Chief Complaint  Patient presents with  . Insect Bite    HPI [name] [name] is a 83 y.o. female.  Pt reports she had lyme disease 10 years ago.  Pt reports she pulled a tick from under her arm.  Pt reports area is still red.  Pt is requesting antibiotics.  Pt is adament she does not want to get lyme disease again.  Pt denies fever or rash.  She is not having any current symptoms.  Pt reports she was told she had to be treated quickly or she will get it again.    The history is provided by the patient. No language interpreter was used.    Past Medical History:  Diagnosis Date  . Arthritis   . Complication of anesthesia    pt states she had trouble waking up  . Frequency of urination   . Glaucoma   . Osteoporosis     Patient Active Problem List   Diagnosis Date Noted  . S/P left THA, AA 10/15/2015  . Right leg weakness 08/23/2013  . Unstable balance 08/23/2013  . Expected blood loss anemia 08/02/2013  . MUSCLE WEAKNESS (GENERALIZED) 11/29/2009  . FATIGUE 11/29/2009  . GENERALIZED PAIN 11/29/2009  . OSTEOARTHRITIS, HIP, RIGHT 09/16/2009  . HIP PAIN 03/14/2009  . SPINAL STENOSIS 03/14/2009  . SCOLIOSIS, LUMBAR SPINE 03/14/2009    Past Surgical History:  Procedure Laterality Date  . BACK SURGERY  1967 / 1970'S   X 2  . CHOLECYSTECTOMY  1980'S  . EYE SURGERY     cataract and lasik for glaucoma  . TOTAL HIP ARTHROPLASTY Right 08/01/2013   Procedure: TOTAL RIGHT HIP ARTHROPLASTY ANTERIOR APPROACH;  Surgeon: Shelda Pal, MD;  Location: [facility] ORS;  Service: Orthopedics;  Laterality: Right;  . TOTAL HIP ARTHROPLASTY Left 10/15/2015   Procedure: LEFT TOTAL HIP ARTHROPLASTY ANTERIOR APPROACH;  Surgeon: Durene Romans, MD;  Location: [facility] ORS;  Service: Orthopedics;  Laterality: Left;  . WRIST FRACTURE SURGERY      OB History    No data available        Home Medications    Prior to Admission medications   Medication Sig Start Date End Date Taking? Authorizing Provider  acetaminophen (TYLENOL) 500 MG tablet Take 2 tablets (1,000 mg total) by mouth every 8 (eight) hours. 10/18/15   Lanney Gins, PA-C  docusate sodium (COLACE) 100 MG capsule Take 1 capsule (100 mg total) by mouth 2 (two) times daily. 10/16/15   Lanney Gins, PA-C  doxycycline (VIBRAMYCIN) 100 MG capsule Take 1 capsule (100 mg total) by mouth 2 (two) times daily. 07/31/16   Elson Areas, PA-C  ferrous sulfate 325 (65 FE) MG tablet Take 1 tablet (325 mg total) by mouth 3 (three) times daily after meals. 10/16/15   Lanney Gins, PA-C  polyethylene glycol (MIRALAX / GLYCOLAX) packet Take 17 g by mouth 2 (two) times daily. 10/16/15   Lanney Gins, PA-C  tiZANidine (ZANAFLEX) 4 MG tablet Take 1 tablet (4 mg total) by mouth every 6 (six) hours as needed for muscle spasms. 10/16/15   Lanney Gins, PA-C  traMADol (ULTRAM) 50 MG tablet Take 1-2 tablets (50-100 mg total) by mouth every 6 (six) hours as needed. 10/18/15   Lanney Gins, PA-C    Family History No family history on file.  Social History Social History  Substance Use  Topics  . Smoking status: Never Smoker  . Smokeless tobacco: Never Used  . Alcohol use Yes     Comment: OCCASIONAL WINE     Allergies   Sulfonamide derivatives; Alphagan [brimonidine]; Cosopt [dorzolamide hcl-timolol mal]; Lumigan [bimatoprost]; Pilocarpine; and Timolol   Review of Systems Review of Systems  All other systems reviewed and are negative.    Physical Exam Updated Vital Signs BP (!) 160/85 (BP Location: Left Arm)   Pulse 71   Temp 98.1 F (36.7 C) (Oral)   Resp 16   Ht  (1.6 m)   Wt 61.2 kg   SpO2 98%   BMI 23.91 kg/m   Physical Exam  Constitutional: She is oriented to person, place, and time. She appears well-developed and well-nourished.  HENT:  Head: Normocephalic.  Eyes: EOM are normal.  Neck:  Normal range of motion.  Pulmonary/Chest: Effort normal.  Abdominal: She exhibits no distension.  Musculoskeletal: Normal range of motion.  Neurological: She is alert and oriented to person, place, and time.  Skin:  4mm area of redness under right arm.   Psychiatric: She has a normal mood and affect.  Nursing note and vitals reviewed.    ED Treatments / Results  Labs (all labs ordered are listed, but only abnormal results are displayed) Labs Reviewed - No data to display  EKG  EKG Interpretation None       Radiology No results found.  Procedures Procedures (including critical care time)  Medications Ordered in ED Medications - No data to display   Initial Impression / Assessment and Plan / ED Course  I have reviewed the triage vital signs and the nursing notes.  Pertinent labs & imaging results that were available during my care of the patient were reviewed by me and considered in my medical decision making (see chart for details).     Pt removed tick today.  She reports it was engorged.    Final Clinical Impressions(s) / ED Diagnoses   Final diagnoses:  Tick bite, initial encounter    New Prescriptions New Prescriptions   DOXYCYCLINE (VIBRAMYCIN) 100 MG CAPSULE    Take 1 capsule (100 mg total) by mouth 2 (two) times daily.  An After Visit Summary was printed and given to the patient.    Lonia Skinner Sutter, PA-C 07/31/16 1836    Vanetta Mulders, MD 08/02/16 [phone]

## 2016-08-17 DIAGNOSIS — S30860A Insect bite (nonvenomous) of lower back and pelvis, initial encounter: Secondary | ICD-10-CM | POA: Diagnosis not present

## 2016-08-17 DIAGNOSIS — Z6823 Body mass index (BMI) 23.0-23.9, adult: Secondary | ICD-10-CM | POA: Diagnosis not present

## 2016-09-18 DIAGNOSIS — M2040 Other hammer toe(s) (acquired), unspecified foot: Secondary | ICD-10-CM | POA: Diagnosis not present

## 2016-09-18 DIAGNOSIS — M79672 Pain in left foot: Secondary | ICD-10-CM | POA: Diagnosis not present

## 2016-09-18 DIAGNOSIS — M2010 Hallux valgus (acquired), unspecified foot: Secondary | ICD-10-CM | POA: Diagnosis not present

## 2016-11-10 DIAGNOSIS — H401122 Primary open-angle glaucoma, left eye, moderate stage: Secondary | ICD-10-CM | POA: Diagnosis not present

## 2016-11-10 DIAGNOSIS — Z961 Presence of intraocular lens: Secondary | ICD-10-CM | POA: Diagnosis not present

## 2016-11-10 DIAGNOSIS — H401111 Primary open-angle glaucoma, right eye, mild stage: Secondary | ICD-10-CM | POA: Diagnosis not present

## 2017-03-30 DIAGNOSIS — Z23 Encounter for immunization: Secondary | ICD-10-CM | POA: Diagnosis not present

## 2017-03-30 DIAGNOSIS — M79644 Pain in right finger(s): Secondary | ICD-10-CM | POA: Diagnosis not present

## 2017-03-31 ENCOUNTER — Ambulatory Visit (INDEPENDENT_AMBULATORY_CARE_PROVIDER_SITE_OTHER): Payer: Medicare Other | Admitting: Orthopedic Surgery

## 2017-03-31 ENCOUNTER — Ambulatory Visit (INDEPENDENT_AMBULATORY_CARE_PROVIDER_SITE_OTHER): Payer: Medicare Other

## 2017-03-31 VITALS — BP 146/90 | HR 72 | Ht 62.0 in | Wt 130.0 lb

## 2017-03-31 DIAGNOSIS — M654 Radial styloid tenosynovitis [de Quervain]: Secondary | ICD-10-CM

## 2017-03-31 DIAGNOSIS — M19049 Primary osteoarthritis, unspecified hand: Secondary | ICD-10-CM

## 2017-03-31 DIAGNOSIS — M25531 Pain in right wrist: Secondary | ICD-10-CM

## 2017-03-31 MED ORDER — DICLOFENAC POTASSIUM 50 MG PO TABS: 50.0000 mg | ORAL_TABLET | Freq: Two times a day (BID) | ORAL | 1 refills | Status: DC

## 2017-03-31 NOTE — Progress Notes (Signed)
NEW PATIENT OFFICE VISIT    Chief Complaint  Patient presents with  . Wrist Pain    Right wrist pain, no injury.    83 year old female with right wrist pain times 1 month.  She complains of dull aching pain over the right wrist exacerbated by activities related to pinch and rotation of the thumb as well as ulnar deviation.  The pain is constant but intensifies with movements as described.  There is been no trauma.  Pain is mild to moderate and has not required any medications although the patient says that she did not take any pain medicine for her hip replacements.  Associated symptoms include crepitance in the thumb    Review of Systems  Neurological: Positive for tingling and sensory change (.all).  All other systems reviewed and are negative.    Past Medical History:  Diagnosis Date  . Arthritis   . Complication of anesthesia    pt states she had trouble waking up  . Frequency of urination   . Glaucoma   . Osteoporosis     Past Surgical History:  Procedure Laterality Date  . BACK SURGERY  1967 / 1970'S   X 2  . CHOLECYSTECTOMY  1980'S  . EYE SURGERY     cataract and lasik for glaucoma  . TOTAL HIP ARTHROPLASTY Right 08/01/2013   Procedure: TOTAL RIGHT HIP ARTHROPLASTY ANTERIOR APPROACH;  Surgeon: Shelda PalMatthew D Olin, MD;  Location: [facility];  Service: Orthopedics;  Laterality: Right;  . TOTAL HIP ARTHROPLASTY Left 10/15/2015   Procedure: LEFT TOTAL HIP ARTHROPLASTY ANTERIOR APPROACH;  Surgeon: Durene RomansMatthew Olin, MD;  Location: [facility];  Service: Orthopedics;  Laterality: Left;  . WRIST FRACTURE SURGERY      No family history on file. Social History   Tobacco Use  . Smoking status: Never Smoker  . Smokeless tobacco: Never Used  Substance Use Topics  . Alcohol use: Yes    Comment: OCCASIONAL WINE  . Drug use: No    Allergies  Allergen Reactions  . Sulfonamide Derivatives Itching and Swelling  . Alphagan [Brimonidine] Itching and Rash  . Cosopt [Dorzolamide Hcl-Timolol  Mal] Itching, Swelling and Rash  . Lumigan [Bimatoprost] Itching and Rash  . Pilocarpine Itching and Rash  . Timolol Itching and Rash     No outpatient medications have been marked as taking for the 03/31/17 encounter (Office Visit) with Vickki HearingHarrison, Gregery Walberg E, MD.    BP (!) 146/90   Pulse 72   Ht 5\' 2"  (1.575 m)   Wt 130 lb (59 kg)   BMI 23.78 kg/m   Physical Exam  Constitutional: She is oriented to person, place, and time. She appears well-developed and well-nourished.  Neurological: She is alert and oriented to person, place, and time.  Psychiatric: She has a normal mood and affect. Judgment normal.  Vitals reviewed.   Ortho Exam Her ambulation is completely normal.  She has no assistive devices.  Right and left upper extremity examination right side we have tenderness over the basilar joint of the thumb with slight subluxation there is tenderness over the first extensor compartment with positive Finkelstein's test and painful ulnar deviation.  Wrist range of motion is otherwise normal and the wrist joint is stable.  She has weak pinch but otherwise normal grip skin shows no rashes or lesions or ulcerations pulse and perfusion are normal capillary refill is excellent she has mild sensory changes in the thumb and index finger lymph nodes on the right side are negative in the  left upper extremity shows normal painless ulnar deviation of the wrist  Imaging studies show CMC arthrosis, see dictated report  Encounter Diagnoses  Name Primary?  . Right wrist pain   . De Quervain's tenosynovitis, right   . CMC arthritis Yes    Meds ordered this encounter  Medications  . diclofenac (CATAFLAM) 50 MG tablet    Sig: Take 1 tablet (50 mg total) by mouth 2 (two) times daily.    Dispense:  56 tablet    Refill:  1    Encounter Diagnosis  Name Primary?  . Right wrist pain Yes     PLAN:   Recommend splinting for 6 weeks and oral anti-inflammatories with a 6-week follow-up  scheduled.

## 2017-03-31 NOTE — Patient Instructions (Addendum)
De Quervain Tenosynovitis  Tendons attach muscles to bones. They also help with joint movements. When tendons become irritated or swollen, it is called tendinitis.  The extensor pollicis brevis (EPB) tendon connects the EPB muscle to a bone that is near the base of the thumb. The EPB muscle helps to straighten and extend the thumb. De Quervain tenosynovitis is a condition in which the EPB tendon lining (sheath) becomes irritated, thickened, and swollen. This condition is sometimes called stenosing tenosynovitis. This condition causes pain on the thumb side of the back of the wrist.  What are the causes?  Causes of this condition include:   Activities that repeatedly cause your thumb and wrist to extend.   A sudden increase in activity or change in activity that affects your wrist.    What increases the risk?  This condition is more likely to develop in:   Females.   People who have diabetes.   Women who have recently given birth.   People who are over 40 years of age.   People who do activities that involve repeated hand and wrist motions, such as tennis, racquetball, volleyball, gardening, and taking care of children.   People who do heavy labor.   People who have poor wrist strength and flexibility.   People who do not warm up properly before activities.    What are the signs or symptoms?  Symptoms of this condition include:   Pain or tenderness over the thumb side of the back of the wrist when your thumb and wrist are not moving.   Pain that gets worse when you straighten your thumb or extend your thumb or wrist.   Pain when the injured area is touched.   Locking or catching of the thumb joint while you bend and straighten your thumb.   Decreased thumb motion due to pain.   Swelling over the affected area.    How is this diagnosed?  This condition is diagnosed with a medical history and physical exam. Your health care provider will ask for details about your injury and ask about your  symptoms.  How is this treated?  Treatment may include the use of icing and medicines to reduce pain and swelling. You may also be advised to wear a splint or brace to limit your thumb and wrist motion. In less severe cases, treatment may also include working with a physical therapist to strengthen your wrist and calm the irritation around your EPB tendon sheath. In severe cases, surgery may be needed.  Follow these instructions at home:  If you have a splint or brace:   Wear it as told by your health care provider. Remove it only as told by your health care provider.   Loosen the splint or brace if your fingers become numb and tingle, or if they turn cold and blue.   Keep the splint or brace clean and dry.  Managing pain, stiffness, and swelling   If directed, apply ice to the injured area.  ? Put ice in a plastic bag.  ? Place a towel between your skin and the bag.  ? Leave the ice on for 20 minutes, 2-3 times per day.   Move your fingers often to avoid stiffness and to lessen swelling.   Raise (elevate) the injured area above the level of your heart while you are sitting or lying down.  General instructions   Return to your normal activities as told by your health care provider. Ask your health care provider   what activities are safe for you.   Take over-the-counter and prescription medicines only as told by your health care provider.   Keep all follow-up visits as told by your health care provider. This is important.   Do not drive or operate heavy machinery while taking prescription pain medicine.  Contact a health care provider if:   Your pain, tenderness, or swelling gets worse, even if you have had treatment.   You have numbness or tingling in your wrist, hand, or fingers on the injured side.  This information is not intended to replace advice given to you by your health care provider. Make sure you discuss any questions you have with your health care provider.  Document Released: 04/20/2005  Document Revised: 09/26/2015 Document Reviewed: 06/26/2014  Elsevier Interactive Patient Education  2018 [employer]

## 2017-04-20 DIAGNOSIS — H401122 Primary open-angle glaucoma, left eye, moderate stage: Secondary | ICD-10-CM | POA: Diagnosis not present

## 2017-04-20 DIAGNOSIS — H401111 Primary open-angle glaucoma, right eye, mild stage: Secondary | ICD-10-CM | POA: Diagnosis not present

## 2017-04-29 IMAGING — DX DG THORACIC SPINE 3V
3 series · 3 of 3 positions shown · non-contrast
Comparison: November 19, 2009.

CLINICAL DATA: Upper back pain without known injury.

EXAM:
THORACIC SPINE - 3 VIEWS

[t-spine ap]
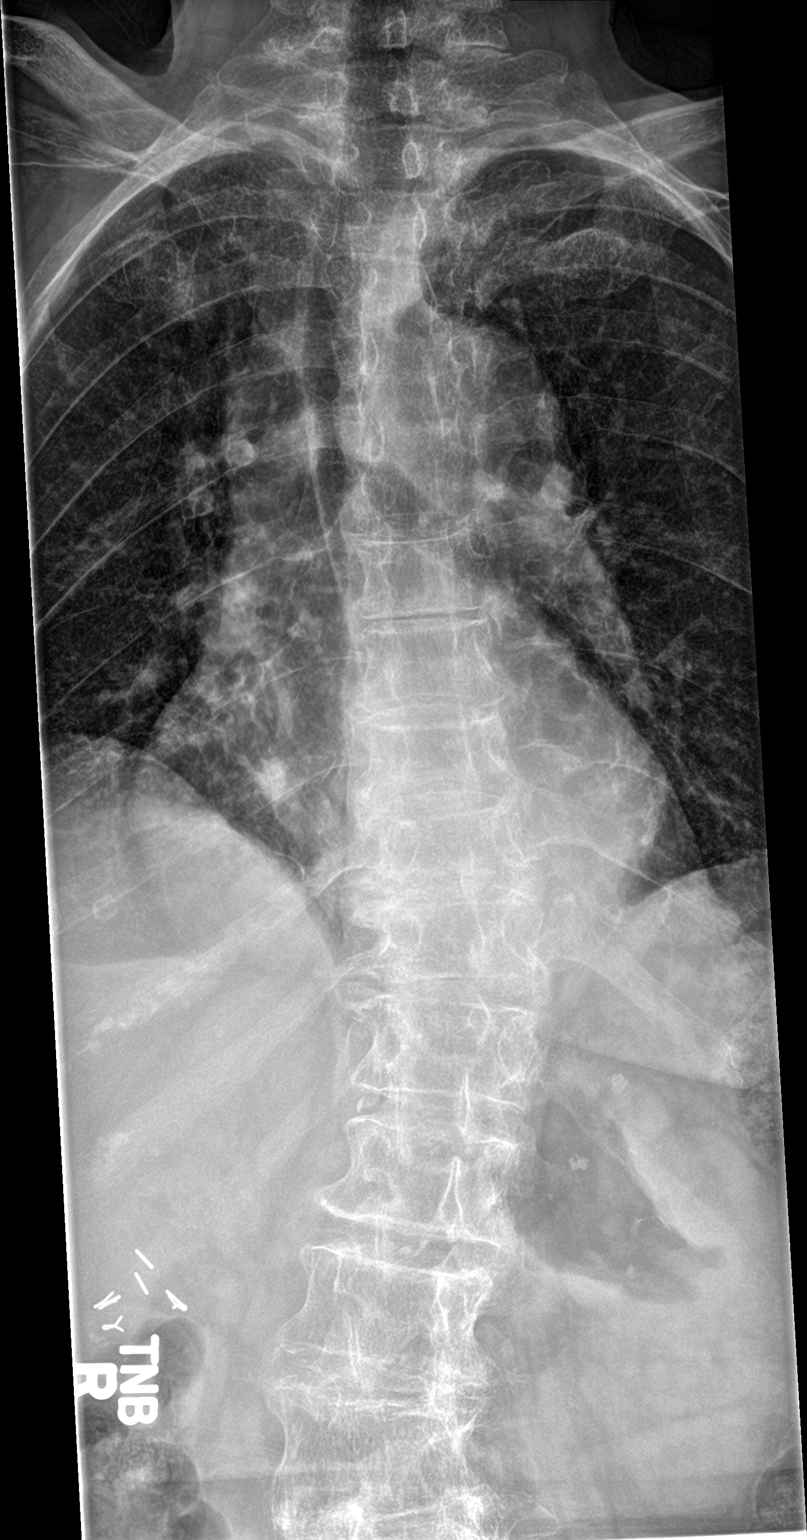

[t-spine lat]
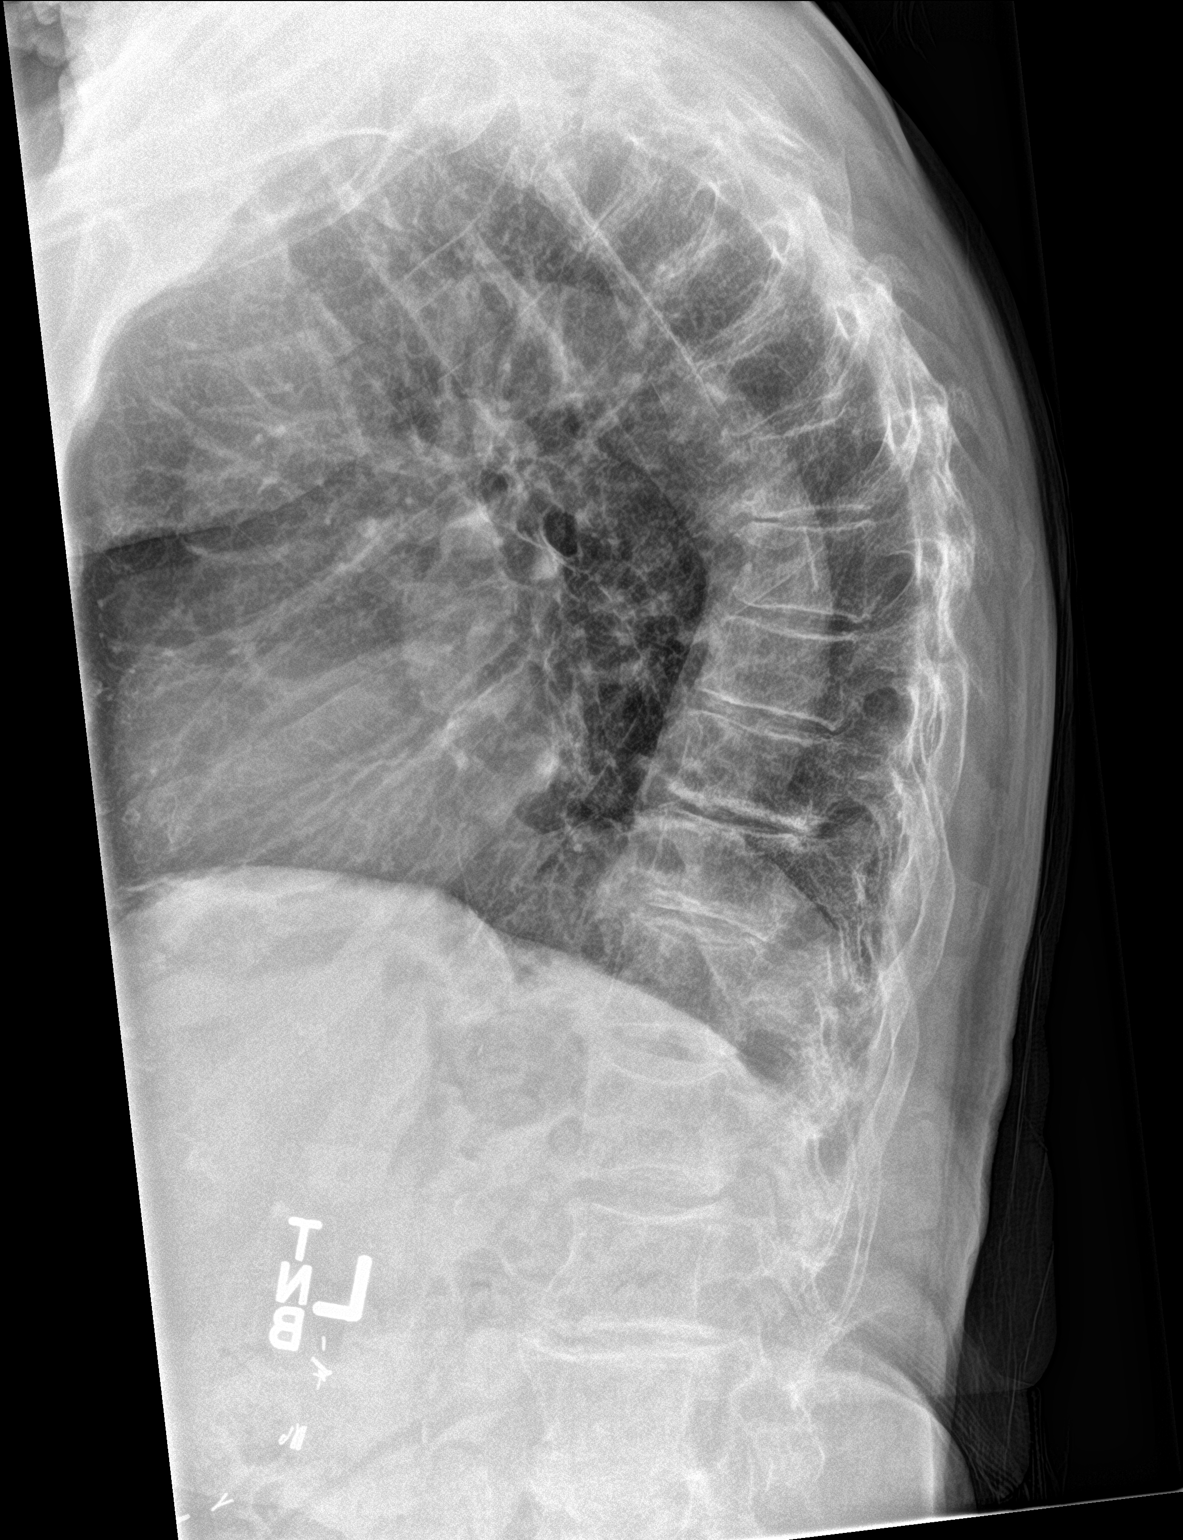

[t-spine swimmers]
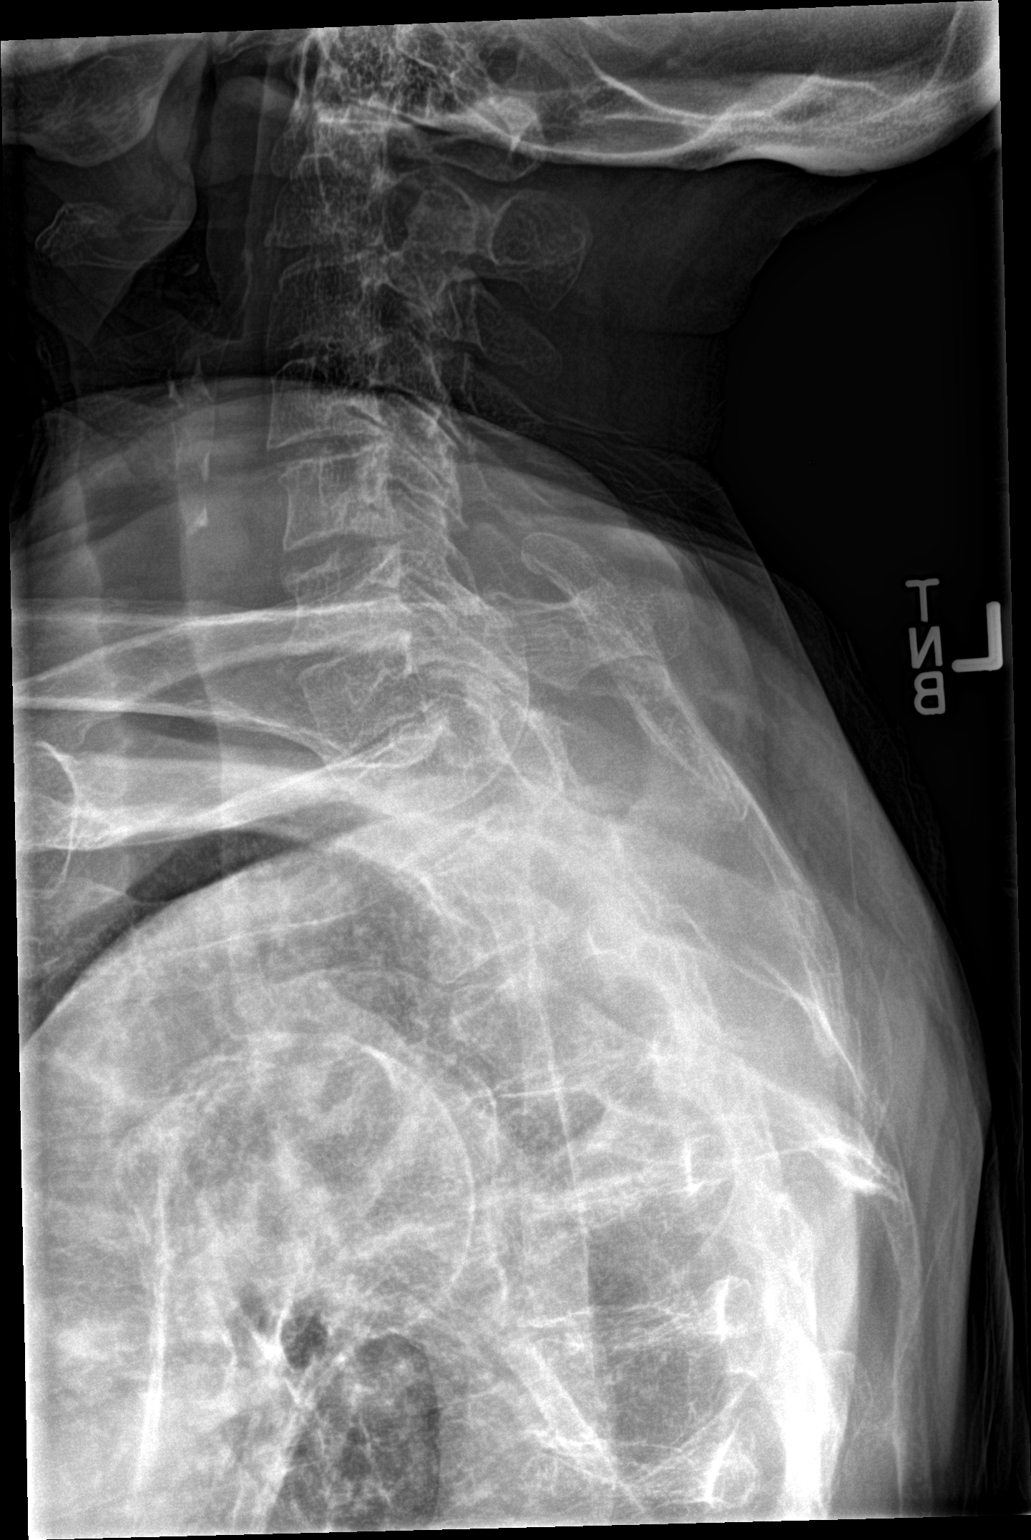

[3 of 3 positions shown; findings below may reference images not displayed]

FINDINGS: No fracture or spondylolisthesis is noted. Mild thoracic kyphosis is
noted. Mild levoscoliosis of lower thoracic spine is noted.
Multilevel degenerative disc disease is noted in the lower thoracic
spine.
IMPRESSION: Multilevel degenerative disc disease is noted in the lower thoracic
spine. No acute abnormality is noted.

## 2017-05-12 ENCOUNTER — Ambulatory Visit: Payer: Medicare Other | Admitting: Orthopedic Surgery

## 2017-08-28 ENCOUNTER — Other Ambulatory Visit: Payer: Self-pay

## 2017-08-28 ENCOUNTER — Emergency Department (HOSPITAL_COMMUNITY)
Admission: EM | Admit: 2017-08-28 | Discharge: 2017-08-28 | Disposition: A | Discharge status: Home / Self Care | Payer: Medicare Other | Attending: Emergency Medicine | Admitting: Emergency Medicine

## 2017-08-28 ENCOUNTER — Encounter (HOSPITAL_COMMUNITY): Payer: Self-pay

## 2017-08-28 DIAGNOSIS — W57XXXA Bitten or stung by nonvenomous insect and other nonvenomous arthropods, initial encounter: Secondary | ICD-10-CM | POA: Insufficient documentation

## 2017-08-28 DIAGNOSIS — S70362A Insect bite (nonvenomous), left thigh, initial encounter: Secondary | ICD-10-CM | POA: Diagnosis not present

## 2017-08-28 DIAGNOSIS — Y939 Activity, unspecified: Secondary | ICD-10-CM | POA: Insufficient documentation

## 2017-08-28 DIAGNOSIS — Z79899 Other long term (current) drug therapy: Secondary | ICD-10-CM | POA: Insufficient documentation

## 2017-08-28 DIAGNOSIS — Y999 Unspecified external cause status: Secondary | ICD-10-CM | POA: Insufficient documentation

## 2017-08-28 DIAGNOSIS — Y929 Unspecified place or not applicable: Secondary | ICD-10-CM | POA: Diagnosis not present

## 2017-08-28 DIAGNOSIS — S80862A Insect bite (nonvenomous), left lower leg, initial encounter: Secondary | ICD-10-CM | POA: Diagnosis not present

## 2017-08-28 MED ORDER — DOXYCYCLINE HYCLATE 100 MG PO CAPS: 100.0000 mg | ORAL_CAPSULE | Freq: Two times a day (BID) | ORAL | 0 refills | Status: DC

## 2017-08-28 NOTE — ED Triage Notes (Signed)
Pt reports she took a tick off of her left thigh yesterday.  Pt says has history of lyme disease and is requesting antibiotic.

## 2017-08-28 NOTE — Discharge Instructions (Signed)
Take the antibiotic as prescribed to help prevent any infection associated with your tick bite, monitor for fever or worsening symptoms

## 2017-08-28 NOTE — ED Provider Notes (Signed)
[facility] EMERGENCY DEPARTMENT Provider Note   CSN: [id number] Arrival date & time: 08/28/17  7829     History   Chief Complaint Chief Complaint  Patient presents with  . Insect Bite    HPI [name] [name] is a 84 y.o. female.  HPI Patient presented to the emergency room for evaluation of a tick bite.  Patient states she has a history of Lyme's disease.  She is always very anxious about getting any tick bites.  She is often outdoors needs to be careful.  Thursday she noticed a tick on her left thigh aspect superior to the knee.  Patient was able to remove the tick.  She came into the emergency room because she wanted to get an antibiotic and did not want to wait until her doctor office opened on Monday.  Patient denies any fevers or chills.  No increasing redness or swelling around the bite.  No complaints. Past Medical History:  Diagnosis Date  . Arthritis   . Complication of anesthesia    pt states she had trouble waking up  . Frequency of urination   . Glaucoma   . Osteoporosis     Patient Active Problem List   Diagnosis Date Noted  . S/P left THA, AA 10/15/2015  . Right leg weakness 08/23/2013  . Unstable balance 08/23/2013  . Expected blood loss anemia 08/02/2013  . MUSCLE WEAKNESS (GENERALIZED) 11/29/2009  . FATIGUE 11/29/2009  . GENERALIZED PAIN 11/29/2009  . OSTEOARTHRITIS, HIP, RIGHT 09/16/2009  . HIP PAIN 03/14/2009  . SPINAL STENOSIS 03/14/2009  . SCOLIOSIS, LUMBAR SPINE 03/14/2009    Past Surgical History:  Procedure Laterality Date  . BACK SURGERY  1967 / 1970'S   X 2  . CHOLECYSTECTOMY  1980'S  . EYE SURGERY     cataract and lasik for glaucoma  . TOTAL HIP ARTHROPLASTY Right 08/01/2013   Procedure: TOTAL RIGHT HIP ARTHROPLASTY ANTERIOR APPROACH;  Surgeon: Shelda Pal, MD;  Location: [facility];  Service: Orthopedics;  Laterality: Right;  . TOTAL HIP ARTHROPLASTY Left 10/15/2015   Procedure: LEFT TOTAL HIP ARTHROPLASTY ANTERIOR APPROACH;  Surgeon:  Durene Romans, MD;  Location: [facility];  Service: Orthopedics;  Laterality: Left;  . WRIST FRACTURE SURGERY       OB History   None      Home Medications    Prior to Admission medications   Medication Sig Start Date End Date Taking? Authorizing Provider  acetaminophen (TYLENOL) 500 MG tablet Take 2 tablets (1,000 mg total) by mouth every 8 (eight) hours. 10/18/15   Lanney Gins, PA-C  diclofenac (CATAFLAM) 50 MG tablet Take 1 tablet (50 mg total) by mouth 2 (two) times daily. 03/31/17   Vickki Hearing, MD  docusate sodium (COLACE) 100 MG capsule Take 1 capsule (100 mg total) by mouth 2 (two) times daily. 10/16/15   Lanney Gins, PA-C  doxycycline (VIBRAMYCIN) 100 MG capsule Take 1 capsule (100 mg total) by mouth 2 (two) times daily. 08/28/17   Linwood Dibbles, MD  ferrous sulfate 325 (65 FE) MG tablet Take 1 tablet (325 mg total) by mouth 3 (three) times daily after meals. 10/16/15   Lanney Gins, PA-C  polyethylene glycol (MIRALAX / GLYCOLAX) packet Take 17 g by mouth 2 (two) times daily. 10/16/15   Lanney Gins, PA-C  tiZANidine (ZANAFLEX) 4 MG tablet Take 1 tablet (4 mg total) by mouth every 6 (six) hours as needed for muscle spasms. 10/16/15   Lanney Gins, PA-C  traMADol Janean Sark) 50  MG tablet Take 1-2 tablets (50-100 mg total) by mouth every 6 (six) hours as needed. 10/18/15   Lanney Gins, PA-C    Family History No family history on file.  Social History Social History   Tobacco Use  . Smoking status: Never Smoker  . Smokeless tobacco: Never Used  Substance Use Topics  . Alcohol use: Yes    Comment: OCCASIONAL WINE  . Drug use: No     Allergies   Sulfonamide derivatives; Alphagan [brimonidine]; Cosopt [dorzolamide hcl-timolol mal]; Lumigan [bimatoprost]; Pilocarpine; and Timolol   Review of Systems Review of Systems  All other systems reviewed and are negative.    Physical Exam Updated Vital Signs BP (!) 157/85 (BP Location: Left Arm)   Pulse 67    Temp (!) 97.5 F (36.4 C) (Oral)   Resp 18   Ht 1.6 m ( )   Wt 59 kg (130 lb)   SpO2 100%   BMI 23.03 kg/m   Physical Exam  Constitutional: She appears well-developed and well-nourished. No distress.  HENT:  Head: Normocephalic and atraumatic.  Right Ear: External ear normal.  Left Ear: External ear normal.  Eyes: Conjunctivae are normal. Right eye exhibits no discharge. Left eye exhibits no discharge. No scleral icterus.  Neck: Neck supple. No tracheal deviation present.  Cardiovascular: Normal rate and regular rhythm.  Pulmonary/Chest: Effort normal and breath sounds normal. No stridor. No respiratory distress.  Abdominal: She exhibits no distension.  Musculoskeletal: She exhibits no edema.  Small few millimeter area of erythema consistent with the removed tick on the lateral aspect of her left distal thigh, no lymphangitic streaking, no target sign  Neurological: She is alert. Cranial nerve deficit: no gross deficits.  Skin: Skin is warm and dry. No rash noted.  Psychiatric: She has a normal mood and affect.  Nursing note and vitals reviewed.    ED Treatments / Results   Procedures Procedures (including critical care time)  Medications Ordered in ED Medications - No data to display   Initial Impression / Assessment and Plan / ED Course  I have reviewed the triage vital signs and the nursing notes.  Pertinent labs & imaging results that were available during my care of the patient were reviewed by me and considered in my medical decision making (see chart for details).    Patient appears well.  No sign of systemic infection.  No significant rash.  I think it is certainly reasonable to have her take a course of doxycycline considering her recent tick bite and her history of Lyme's disease.  Discussed warning signs and precautions to look out for  Final Clinical Impressions(s) / ED Diagnoses   Final diagnoses:  Tick bite, initial encounter    ED Discharge  Orders        Ordered    doxycycline (VIBRAMYCIN) 100 MG capsule  2 times daily     08/28/17 1610       Linwood Dibbles, MD 08/28/17 [phone]

## 2017-09-30 DIAGNOSIS — Z23 Encounter for immunization: Secondary | ICD-10-CM | POA: Diagnosis not present

## 2017-09-30 DIAGNOSIS — S30860A Insect bite (nonvenomous) of lower back and pelvis, initial encounter: Secondary | ICD-10-CM | POA: Diagnosis not present

## 2017-09-30 DIAGNOSIS — W57XXXA Bitten or stung by nonvenomous insect and other nonvenomous arthropods, initial encounter: Secondary | ICD-10-CM | POA: Diagnosis not present

## 2017-09-30 DIAGNOSIS — Z6823 Body mass index (BMI) 23.0-23.9, adult: Secondary | ICD-10-CM | POA: Diagnosis not present

## 2017-09-30 DIAGNOSIS — M79644 Pain in right finger(s): Secondary | ICD-10-CM | POA: Diagnosis not present

## 2017-10-12 DIAGNOSIS — Z961 Presence of intraocular lens: Secondary | ICD-10-CM | POA: Diagnosis not present

## 2017-10-12 DIAGNOSIS — H401111 Primary open-angle glaucoma, right eye, mild stage: Secondary | ICD-10-CM | POA: Diagnosis not present

## 2017-10-12 DIAGNOSIS — H401122 Primary open-angle glaucoma, left eye, moderate stage: Secondary | ICD-10-CM | POA: Diagnosis not present

## 2017-10-23 ENCOUNTER — Other Ambulatory Visit: Payer: Self-pay

## 2017-10-23 ENCOUNTER — Inpatient Hospital Stay (HOSPITAL_COMMUNITY): Payer: Medicare Other

## 2017-10-23 ENCOUNTER — Other Ambulatory Visit (HOSPITAL_COMMUNITY): Payer: 59

## 2017-10-23 ENCOUNTER — Inpatient Hospital Stay (HOSPITAL_COMMUNITY)
Admission: EM | Admit: 2017-10-23 | Discharge: 2017-10-24 | DRG: 244 | Disposition: A | Discharge status: Home / Self Care | Payer: Medicare Other | Attending: Internal Medicine | Admitting: Internal Medicine

## 2017-10-23 ENCOUNTER — Encounter (HOSPITAL_COMMUNITY): Payer: Self-pay

## 2017-10-23 ENCOUNTER — Inpatient Hospital Stay (HOSPITAL_COMMUNITY)
Admission: EM | Disposition: A | Discharge status: Home / Self Care | Payer: Self-pay | Source: Home / Self Care | Attending: Nephrology

## 2017-10-23 ENCOUNTER — Emergency Department (HOSPITAL_COMMUNITY): Payer: Medicare Other

## 2017-10-23 ENCOUNTER — Encounter (HOSPITAL_COMMUNITY)
Admission: EM | Disposition: A | Discharge status: Home / Self Care | Payer: Self-pay | Source: Home / Self Care | Attending: Nephrology

## 2017-10-23 DIAGNOSIS — M810 Age-related osteoporosis without current pathological fracture: Secondary | ICD-10-CM | POA: Diagnosis present

## 2017-10-23 DIAGNOSIS — R55 Syncope and collapse: Secondary | ICD-10-CM | POA: Diagnosis not present

## 2017-10-23 DIAGNOSIS — I340 Nonrheumatic mitral (valve) insufficiency: Secondary | ICD-10-CM

## 2017-10-23 DIAGNOSIS — Z9049 Acquired absence of other specified parts of digestive tract: Secondary | ICD-10-CM

## 2017-10-23 DIAGNOSIS — R112 Nausea with vomiting, unspecified: Secondary | ICD-10-CM | POA: Diagnosis not present

## 2017-10-23 DIAGNOSIS — I442 Atrioventricular block, complete: Principal | ICD-10-CM

## 2017-10-23 DIAGNOSIS — I351 Nonrheumatic aortic (valve) insufficiency: Secondary | ICD-10-CM

## 2017-10-23 DIAGNOSIS — I440 Atrioventricular block, first degree: Secondary | ICD-10-CM | POA: Diagnosis not present

## 2017-10-23 DIAGNOSIS — J9811 Atelectasis: Secondary | ICD-10-CM | POA: Diagnosis not present

## 2017-10-23 DIAGNOSIS — Z96643 Presence of artificial hip joint, bilateral: Secondary | ICD-10-CM | POA: Diagnosis present

## 2017-10-23 DIAGNOSIS — Z959 Presence of cardiac and vascular implant and graft, unspecified: Secondary | ICD-10-CM

## 2017-10-23 DIAGNOSIS — I459 Conduction disorder, unspecified: Secondary | ICD-10-CM

## 2017-10-23 DIAGNOSIS — Z45018 Encounter for adjustment and management of other part of cardiac pacemaker: Secondary | ICD-10-CM

## 2017-10-23 DIAGNOSIS — I499 Cardiac arrhythmia, unspecified: Secondary | ICD-10-CM | POA: Diagnosis not present

## 2017-10-23 DIAGNOSIS — I517 Cardiomegaly: Secondary | ICD-10-CM | POA: Diagnosis not present

## 2017-10-23 DIAGNOSIS — R0902 Hypoxemia: Secondary | ICD-10-CM | POA: Diagnosis not present

## 2017-10-23 DIAGNOSIS — I4430 Unspecified atrioventricular block: Secondary | ICD-10-CM | POA: Diagnosis not present

## 2017-10-23 DIAGNOSIS — R0689 Other abnormalities of breathing: Secondary | ICD-10-CM | POA: Diagnosis not present

## 2017-10-23 DIAGNOSIS — H409 Unspecified glaucoma: Secondary | ICD-10-CM | POA: Diagnosis not present

## 2017-10-23 DIAGNOSIS — Z452 Encounter for adjustment and management of vascular access device: Secondary | ICD-10-CM | POA: Diagnosis not present

## 2017-10-23 HISTORY — PX: PACEMAKER IMPLANT: EP1218

## 2017-10-23 HISTORY — PX: TEMPORARY PACEMAKER: CATH118268

## 2017-10-23 LAB — I-STAT CHEM 8, ED
BUN: 24 mg/dL — ABNORMAL HIGH (ref 6–20)
Calcium, Ion: 1.07 mmol/L — ABNORMAL LOW (ref 1.15–1.40)
Chloride: 106 mmol/L (ref 101–111)
Creatinine, Ser: 0.8 mg/dL (ref 0.44–1.00)
Glucose, Bld: 168 mg/dL — ABNORMAL HIGH (ref 65–99)
HCT: 42 % (ref 36.0–46.0)
Hemoglobin: 14.3 g/dL (ref 12.0–15.0)
Potassium: 5.1 mmol/L (ref 3.5–5.1)
Sodium: 140 mmol/L (ref 135–145)
TCO2: 26 mmol/L (ref 22–32)

## 2017-10-23 LAB — COMPREHENSIVE METABOLIC PANEL
ALT: 14 U/L (ref 14–54)
AST: 28 U/L (ref 15–41)
Albumin: 3.6 g/dL (ref 3.5–5.0)
Alkaline Phosphatase: 55 U/L (ref 38–126)
Anion gap: 8 (ref 5–15)
BUN: 17 mg/dL (ref 6–20)
CO2: 24 mmol/L (ref 22–32)
Calcium: 8.6 mg/dL — ABNORMAL LOW (ref 8.9–10.3)
Chloride: 109 mmol/L (ref 101–111)
Creatinine, Ser: 0.88 mg/dL (ref 0.44–1.00)
GFR calc Af Amer: >60 mL/min (ref >60)
GFR calc non Af Amer: 56 mL/min — ABNORMAL LOW (ref >60)
Glucose, Bld: 178 mg/dL — ABNORMAL HIGH (ref 65–99)
Potassium: 4.9 mmol/L (ref 3.5–5.1)
Sodium: 141 mmol/L (ref 135–145)
Total Bilirubin: 1.1 mg/dL (ref 0.3–1.2)
Total Protein: 5.9 g/dL — ABNORMAL LOW (ref 6.5–8.1)

## 2017-10-23 LAB — CBC WITH DIFFERENTIAL/PLATELET
Abs Immature Granulocytes: 0.1 10*3/uL (ref 0.0–0.1)
Basophils Absolute: 0.1 10*3/uL (ref 0.0–0.1)
Basophils Relative: 0 %
Eosinophils Absolute: 0.1 10*3/uL (ref 0.0–0.7)
Eosinophils Relative: 1 %
HCT: 44.9 % (ref 36.0–46.0)
Hemoglobin: 14.3 g/dL (ref 12.0–15.0)
Immature Granulocytes: 0 %
Lymphocytes Relative: 36 %
Lymphs Abs: 4 10*3/uL (ref 0.7–4.0)
MCH: 30.6 pg (ref 26.0–34.0)
MCHC: 31.8 g/dL (ref 30.0–36.0)
MCV: 96.1 fL (ref 78.0–100.0)
Monocytes Absolute: 0.7 10*3/uL (ref 0.1–1.0)
Monocytes Relative: 6 %
Neutro Abs: 6.3 10*3/uL (ref 1.7–7.7)
Neutrophils Relative %: 57 %
Platelets: 121 10*3/uL — ABNORMAL LOW (ref 150–400)
RBC: 4.67 MIL/uL (ref 3.87–5.11)
RDW: 12 % (ref 11.5–15.5)
WBC: 11.2 10*3/uL — ABNORMAL HIGH (ref 4.0–10.5)

## 2017-10-23 LAB — I-STAT TROPONIN, ED: Troponin i, poc: 0 ng/mL (ref 0.00–0.08)

## 2017-10-23 LAB — TSH: TSH: 1.867 u[IU]/mL (ref 0.350–4.500)

## 2017-10-23 LAB — MAGNESIUM: Magnesium: 2 mg/dL (ref 1.7–2.4)

## 2017-10-23 LAB — ECHOCARDIOGRAM COMPLETE

## 2017-10-23 LAB — SURGICAL PCR SCREEN
MRSA, PCR: NEGATIVE
Staphylococcus aureus: NEGATIVE

## 2017-10-23 LAB — PHOSPHORUS: Phosphorus: 3.8 mg/dL (ref 2.5–4.6)

## 2017-10-23 SURGERY — TEMPORARY PACEMAKER
Anesthesia: LOCAL

## 2017-10-23 SURGERY — PACEMAKER IMPLANT
Anesthesia: LOCAL

## 2017-10-23 MED ORDER — LIDOCAINE HCL 1 % IJ SOLN
INTRAMUSCULAR | Status: AC
  Filled 2017-10-23: qty 20

## 2017-10-23 MED ORDER — FERROUS SULFATE 325 (65 FE) MG PO TABS
325.0000 mg | ORAL_TABLET | Freq: Three times a day (TID) | ORAL | Status: DC
  Administered 2017-10-23 – 2017-10-24 (×2): 325 mg via ORAL
  Filled 2017-10-23 (×4): qty 1

## 2017-10-23 MED ORDER — CHLORHEXIDINE GLUCONATE 4 % EX LIQD: 60.0000 mL | Freq: Once | CUTANEOUS | Status: DC

## 2017-10-23 MED ORDER — IOPAMIDOL (ISOVUE-370) INJECTION 76%
INTRAVENOUS | Status: AC
  Filled 2017-10-23: qty 50

## 2017-10-23 MED ORDER — ACETAMINOPHEN 500 MG PO TABS
1000.0000 mg | ORAL_TABLET | Freq: Three times a day (TID) | ORAL | Status: DC
Start: 2017-10-23 — End: 2017-10-23

## 2017-10-23 MED ORDER — SODIUM CHLORIDE 0.9% FLUSH: 3.0000 mL | INTRAVENOUS | Status: DC | PRN

## 2017-10-23 MED ORDER — POLYETHYLENE GLYCOL 3350 17 G PO PACK: 17.0000 g | PACK | Freq: Two times a day (BID) | ORAL | Status: DC

## 2017-10-23 MED ORDER — HEPARIN (PORCINE) IN NACL 1000-0.9 UT/500ML-% IV SOLN
INTRAVENOUS | Status: AC
  Filled 2017-10-23: qty 500

## 2017-10-23 MED ORDER — DICLOFENAC POTASSIUM 50 MG PO TABS
50.0000 mg | ORAL_TABLET | Freq: Two times a day (BID) | ORAL | Status: DC
  Filled 2017-10-23: qty 1

## 2017-10-23 MED ORDER — IOPAMIDOL (ISOVUE-370) INJECTION 76%
INTRAVENOUS | Status: DC | PRN
  Administered 2017-10-23: 15 mL via INTRAVENOUS

## 2017-10-23 MED ORDER — SODIUM CHLORIDE 0.9 % IV SOLN: 250.0000 mL | INTRAVENOUS | Status: DC | PRN

## 2017-10-23 MED ORDER — ONDANSETRON HCL 4 MG/2ML IJ SOLN
4.0000 mg | Freq: Four times a day (QID) | INTRAMUSCULAR | Status: DC | PRN
  Administered 2017-10-23: 4 mg via INTRAVENOUS
  Filled 2017-10-23: qty 2

## 2017-10-23 MED ORDER — SODIUM CHLORIDE 0.9% FLUSH
3.0000 mL | Freq: Two times a day (BID) | INTRAVENOUS | Status: DC
  Administered 2017-10-23 – 2017-10-24 (×2): 3 mL via INTRAVENOUS

## 2017-10-23 MED ORDER — ONDANSETRON HCL 4 MG/2ML IJ SOLN: 4.0000 mg | Freq: Four times a day (QID) | INTRAMUSCULAR | Status: DC | PRN

## 2017-10-23 MED ORDER — DOCUSATE SODIUM 100 MG PO CAPS
100.0000 mg | ORAL_CAPSULE | Freq: Two times a day (BID) | ORAL | Status: DC
  Administered 2017-10-24 (×2): 100 mg via ORAL
  Filled 2017-10-23 (×2): qty 1

## 2017-10-23 MED ORDER — TRAMADOL HCL 50 MG PO TABS: 50.0000 mg | ORAL_TABLET | Freq: Four times a day (QID) | ORAL | Status: DC | PRN

## 2017-10-23 MED ORDER — LIDOCAINE HCL (PF) 1 % IJ SOLN
INTRAMUSCULAR | Status: DC | PRN
  Administered 2017-10-23: 80 mL

## 2017-10-23 MED ORDER — HEPARIN (PORCINE) IN NACL 2-0.9 UNITS/ML
INTRAMUSCULAR | Status: AC | PRN
  Administered 2017-10-23: 1000 mL

## 2017-10-23 MED ORDER — SODIUM CHLORIDE 0.9 % IV SOLN
INTRAVENOUS | Status: AC | PRN
  Administered 2017-10-23: 10 mL/h via INTRAVENOUS

## 2017-10-23 MED ORDER — HYDROCODONE-ACETAMINOPHEN 5-325 MG PO TABS
1.0000 | ORAL_TABLET | ORAL | Status: DC | PRN
Start: 2017-10-23 — End: 2017-10-23

## 2017-10-23 MED ORDER — SODIUM CHLORIDE 0.9 % IV SOLN
INTRAVENOUS | Status: AC
  Filled 2017-10-23: qty 2

## 2017-10-23 MED ORDER — CEFAZOLIN SODIUM-DEXTROSE 2-4 GM/100ML-% IV SOLN
INTRAVENOUS | Status: AC
  Filled 2017-10-23: qty 100

## 2017-10-23 MED ORDER — CEFAZOLIN SODIUM-DEXTROSE 2-4 GM/100ML-% IV SOLN
2.0000 g | INTRAVENOUS | Status: AC
  Administered 2017-10-23: 2 g via INTRAVENOUS
  Filled 2017-10-23: qty 100

## 2017-10-23 MED ORDER — SODIUM CHLORIDE 0.9 % IV SOLN: INTRAVENOUS | Status: DC

## 2017-10-23 MED ORDER — TIZANIDINE HCL 4 MG PO TABS: 4.0000 mg | ORAL_TABLET | Freq: Four times a day (QID) | ORAL | Status: DC | PRN

## 2017-10-23 MED ORDER — ACETAMINOPHEN 325 MG PO TABS
325.0000 mg | ORAL_TABLET | ORAL | Status: DC | PRN
  Administered 2017-10-24: 650 mg via ORAL
  Filled 2017-10-23: qty 2

## 2017-10-23 MED ORDER — CEFAZOLIN SODIUM-DEXTROSE 1-4 GM/50ML-% IV SOLN
1.0000 g | Freq: Four times a day (QID) | INTRAVENOUS | Status: AC
  Administered 2017-10-23 – 2017-10-24 (×3): 1 g via INTRAVENOUS
  Filled 2017-10-23 (×3): qty 50

## 2017-10-23 MED ORDER — LIDOCAINE HCL (PF) 1 % IJ SOLN
INTRAMUSCULAR | Status: AC
  Filled 2017-10-23: qty 30

## 2017-10-23 MED ORDER — ONDANSETRON HCL 4 MG PO TABS: 4.0000 mg | ORAL_TABLET | Freq: Four times a day (QID) | ORAL | Status: DC | PRN

## 2017-10-23 MED ORDER — SODIUM CHLORIDE 0.9 % IV SOLN
250.0000 mL | INTRAVENOUS | Status: DC | PRN
  Administered 2017-10-23: 250 mL via INTRAVENOUS

## 2017-10-23 MED ORDER — SODIUM CHLORIDE 0.9 % IV SOLN
80.0000 mg | INTRAVENOUS | Status: AC
  Administered 2017-10-23: 80 mg
  Filled 2017-10-23: qty 2

## 2017-10-23 MED ORDER — SODIUM CHLORIDE 0.9% FLUSH: 3.0000 mL | Freq: Two times a day (BID) | INTRAVENOUS | Status: DC

## 2017-10-23 MED ORDER — SODIUM CHLORIDE 0.9 % IV SOLN
INTRAVENOUS | Status: DC
Start: 2017-10-24 — End: 2017-10-23

## 2017-10-23 MED ORDER — LIDOCAINE HCL (PF) 1 % IJ SOLN
INTRAMUSCULAR | Status: DC | PRN
  Administered 2017-10-23: 1 mL

## 2017-10-23 SURGICAL SUPPLY — 6 items
CABLE SURGICAL S-101-97-12 (CABLE) ×1 IMPLANT
CATH S G BIP PACING (SET/KITS/TRAYS/PACK) ×1 IMPLANT
KIT MICROPUNCTURE NIT STIFF (SHEATH) ×1 IMPLANT
PACK CARDIAC CATHETERIZATION (CUSTOM PROCEDURE TRAY) ×1 IMPLANT
SHEATH PINNACLE 6F 10CM (SHEATH) ×1 IMPLANT
SLEEVE REPOSITIONING LENGTH 30 (MISCELLANEOUS) ×1 IMPLANT

## 2017-10-23 SURGICAL SUPPLY — 7 items
CABLE SURGICAL S-101-97-12 (CABLE) ×2 IMPLANT
LEAD TENDRIL MRI 46CM LPA1200M (Lead) ×1 IMPLANT
LEAD TENDRIL MRI 58CM LPA1200M (Lead) ×1 IMPLANT
PACEMAKER ASSURITY DR-RF (Pacemaker) ×1 IMPLANT
PAD DEFIB LIFELINK (PAD) ×2 IMPLANT
SHEATH CLASSIC 8F (SHEATH) ×2 IMPLANT
TRAY PACEMAKER INSERTION (PACKS) ×2 IMPLANT

## 2017-10-23 NOTE — ED Notes (Signed)
TO CATH LAB NOW 

## 2017-10-23 NOTE — ED Provider Notes (Signed)
[facility] EMERGENCY DEPARTMENT Provider Note   CSN: [id number] Arrival date & time: 10/23/17  0715     History   Chief Complaint Chief Complaint  Patient presents with  . Heart block/Brady    HPI [name] [name] is a 84 y.o. female.  HPI Patient presents after syncopal episode.  Poorly had some nausea and vomiting to.  However with EMS she had a second-degree and in third-degree heart block with loss of mental status vomiting and incontinence.  Denies chest pain.  Had not passed out till today.  No trauma.  She is on no blocking medications.  Has been doing well the last couple days.  No cough.  No headache.  No confusion. Past Medical History:  Diagnosis Date  . Arthritis   . Complication of anesthesia    pt states she had trouble waking up  . Frequency of urination   . Glaucoma   . Osteoporosis     Patient Active Problem List   Diagnosis Date Noted  . S/P left THA, AA 10/15/2015  . Right leg weakness 08/23/2013  . Unstable balance 08/23/2013  . Expected blood loss anemia 08/02/2013  . MUSCLE WEAKNESS (GENERALIZED) 11/29/2009  . FATIGUE 11/29/2009  . GENERALIZED PAIN 11/29/2009  . OSTEOARTHRITIS, HIP, RIGHT 09/16/2009  . HIP PAIN 03/14/2009  . SPINAL STENOSIS 03/14/2009  . SCOLIOSIS, LUMBAR SPINE 03/14/2009    Past Surgical History:  Procedure Laterality Date  . BACK SURGERY  1967 / 1970'S   X 2  . CHOLECYSTECTOMY  1980'S  . EYE SURGERY     cataract and lasik for glaucoma  . TOTAL HIP ARTHROPLASTY Right 08/01/2013   Procedure: TOTAL RIGHT HIP ARTHROPLASTY ANTERIOR APPROACH;  Surgeon: Shelda Pal, MD;  Location: [facility];  Service: Orthopedics;  Laterality: Right;  . TOTAL HIP ARTHROPLASTY Left 10/15/2015   Procedure: LEFT TOTAL HIP ARTHROPLASTY ANTERIOR APPROACH;  Surgeon: Durene Romans, MD;  Location: [facility];  Service: Orthopedics;  Laterality: Left;  . WRIST FRACTURE SURGERY       OB History   None      Home Medications    Prior  to Admission medications   Medication Sig Start Date End Date Taking? Authorizing Provider  acetaminophen (TYLENOL) 500 MG tablet Take 2 tablets (1,000 mg total) by mouth every 8 (eight) hours. 10/18/15   Lanney Gins, PA-C  diclofenac (CATAFLAM) 50 MG tablet Take 1 tablet (50 mg total) by mouth 2 (two) times daily. 03/31/17   Vickki Hearing, MD  docusate sodium (COLACE) 100 MG capsule Take 1 capsule (100 mg total) by mouth 2 (two) times daily. 10/16/15   Lanney Gins, PA-C  doxycycline (VIBRAMYCIN) 100 MG capsule Take 1 capsule (100 mg total) by mouth 2 (two) times daily. 08/28/17   Linwood Dibbles, MD  ferrous sulfate 325 (65 FE) MG tablet Take 1 tablet (325 mg total) by mouth 3 (three) times daily after meals. 10/16/15   Lanney Gins, PA-C  polyethylene glycol (MIRALAX / GLYCOLAX) packet Take 17 g by mouth 2 (two) times daily. 10/16/15   Lanney Gins, PA-C  tiZANidine (ZANAFLEX) 4 MG tablet Take 1 tablet (4 mg total) by mouth every 6 (six) hours as needed for muscle spasms. 10/16/15   Lanney Gins, PA-C  traMADol (ULTRAM) 50 MG tablet Take 1-2 tablets (50-100 mg total) by mouth every 6 (six) hours as needed. 10/18/15   Lanney Gins, PA-C    Family History History reviewed. No pertinent family history.  Social History  Social History   Tobacco Use  . Smoking status: Never Smoker  . Smokeless tobacco: Never Used  Substance Use Topics  . Alcohol use: Yes    Comment: OCCASIONAL WINE  . Drug use: No     Allergies   Sulfonamide derivatives; Alphagan [brimonidine]; Cosopt [dorzolamide hcl-timolol mal]; Lumigan [bimatoprost]; Pilocarpine; and Timolol   Review of Systems Review of Systems  Constitutional: Negative for appetite change.  HENT: Negative for congestion.   Respiratory: Negative for shortness of breath.   Cardiovascular: Negative for chest pain.  Gastrointestinal: Positive for nausea and vomiting. Negative for abdominal pain.  Genitourinary: Negative for dysuria.   Musculoskeletal: Negative for back pain.  Skin: Negative for rash.  Neurological: Positive for syncope and light-headedness.  Psychiatric/Behavioral: Negative for confusion.     Physical Exam Updated Vital Signs BP (!) 160/90   Pulse (!) 57   Resp (!) 25   SpO2 94%   Physical Exam  Constitutional: She is oriented to person, place, and time. She appears well-developed.  HENT:  Head: Normocephalic.  Eyes: EOM are normal.  Neck: Neck supple.  Cardiovascular:  Mild bradycardia  Pulmonary/Chest: Effort normal.  Abdominal: Soft. There is no tenderness.  Musculoskeletal: She exhibits no edema.  Neurological: She is alert and oriented to person, place, and time.  Skin: Skin is warm. Capillary refill takes less than 2 seconds.     ED Treatments / Results  Labs (all labs ordered are listed, but only abnormal results are displayed) Labs Reviewed  CBC WITH DIFFERENTIAL/PLATELET - Abnormal; Notable for the following components:      Result Value   WBC 11.4 (*)    Platelets 137 (*)    All other components within normal limits  I-STAT CHEM 8, ED - Abnormal; Notable for the following components:   BUN 24 (*)    Glucose, Bld 168 (*)    Calcium, Ion 1.07 (*)    All other components within normal limits  COMPREHENSIVE METABOLIC PANEL  MAGNESIUM  PHOSPHORUS  TSH  I-STAT TROPONIN, ED    EKG EKG Interpretation  Date/Time:  Saturday October 23 2017 07:32:18 EDT Ventricular Rate:  60 PR Interval:    QRS Duration: 98 QT Interval:  483 QTC Calculation: 483 R Axis:   48 Text Interpretation:  Sinus rhythm Low voltage, precordial leads Borderline T abnormalities, anterior leads Confirmed by Benjiman CorePickering, Ben Habermann [phone]) on 10/23/2017 7:38:12 AM   Radiology No results found.  Procedures Procedures (including critical care time)  Medications Ordered in ED Medications - No data to display   Initial Impression / Assessment and Plan / ED Course  I have reviewed the triage vital  signs and the nursing notes.  Pertinent labs & imaging results that were available during my care of the patient were reviewed by me and considered in my medical decision making (see chart for details).     Patient with syncopal episode.  Likely secondary to her third-degree heart block.  She is not on any nodal blocking medications.  Normal potassium.  Seen in the ER by cardiology but due to age recommended medicine admission.  Did have another 2 episodes of third-degree block while in the ER.  CRITICAL CARE Performed by: Benjiman CoreNathan Adreonna Yontz Total critical care time: 30 minutes Critical care time was exclusive of separately billable procedures and treating other patients. Critical care was necessary to treat or prevent imminent or life-threatening deterioration. Critical care was time spent personally by me on the following activities: development of treatment plan with  patient and/or surrogate as well as nursing, discussions with consultants, evaluation of patient's response to treatment, examination of patient, obtaining history from patient or surrogate, ordering and performing treatments and interventions, ordering and review of laboratory studies, ordering and review of radiographic studies, pulse oximetry and re-evaluation of patient's condition.   Final Clinical Impressions(s) / ED Diagnoses   Final diagnoses:  Complete heart block (HCC)  Syncope, unspecified syncope type    ED Discharge Orders    None       Benjiman Core, MD 10/23/17 [phone]

## 2017-10-23 NOTE — ED Triage Notes (Signed)
Pt arrived via [facility]; per EMS pt with complaint of vertigo; upon arrival pt presented diaphoretic and EKG showed heart block. Pt rec'd 4mg  f zofran for nausea and was going from SB to reg SR; EMS stood patient up and she then went into what EMS states to be a 2nd or 3rd degree heart block; 3 mins prior to arrival at hospital, pt had a ventricular pause and vomitted. Pt re'd 250 NS bolus; CBG 104; 160/85; 99% on 3L of O2; 28 R; 50 P

## 2017-10-23 NOTE — Consult Note (Signed)
Cardiology Consultation:   Patient ID: [name]; [id number]; 07-27-1923   Admit date: 10/23/2017 Date of Consult: 10/23/2017  Primary Care Provider: Benita Stabile, MD Primary Cardiologist: No primary care provider on file. [place] Primary Electrophysiologist:  New Allred   Patient Profile:   [name] [name] is a 84 y.o. female with no prior cardiac history who is being seen today for the evaluation of complete heart block, syncope at the request of Dr. Benjiman Core.  History of Present Illness:   [name] [name] is a 84 year old female with no prior cardiac history, on no beta-blockers, no calcium channel blockers, no prior thyroid abnormalities who earlier this morning turned her head in bed and felt dizzy.  Felt overall poor.  She arrived to the emergency room via EMS.  She lives alone but still drives, has a son who lives in [place].  While being transported via EMS she demonstrated approximately 10 to 15 seconds of ventricular pause, i.e. no QRS complexes with underlying P wave confirmation and sinus bradycardia.  There was slight increase in PR segment prior to ventricular arrest.   Here in the emergency room, she did not complain of any chest pain, no shortness of breath, no recent fevers.  Interestingly in April she did come into the ER with tick bite.  Denies headaches, denies diabetes, no hypertension, no prior cardiac events.  She asked why would my heart do this now.  She had another episode of 10 to 15-second ventricular pause with underlying P waves.  No ventricular escape.  At that time, she lost consciousness.  She promptly came to after with mild nausea following.    Past Medical History:  Diagnosis Date  . Arthritis   . Complication of anesthesia    pt states she had trouble waking up  . Frequency of urination   . Glaucoma   . Osteoporosis     Past Surgical History:  Procedure Laterality Date  . BACK SURGERY  1967 / 1970'S   X 2  . CHOLECYSTECTOMY  1980'S    . EYE SURGERY     cataract and lasik for glaucoma  . TOTAL HIP ARTHROPLASTY Right 08/01/2013   Procedure: TOTAL RIGHT HIP ARTHROPLASTY ANTERIOR APPROACH;  Surgeon: Shelda Pal, MD;  Location: [facility];  Service: Orthopedics;  Laterality: Right;  . TOTAL HIP ARTHROPLASTY Left 10/15/2015   Procedure: LEFT TOTAL HIP ARTHROPLASTY ANTERIOR APPROACH;  Surgeon: Durene Romans, MD;  Location: [facility];  Service: Orthopedics;  Laterality: Left;  . WRIST FRACTURE SURGERY       Home Medications:  Prior to Admission medications   Medication Sig Start Date End Date Taking? Authorizing Provider  acetaminophen (TYLENOL) 500 MG tablet Take 2 tablets (1,000 mg total) by mouth every 8 (eight) hours. 10/18/15   Lanney Gins, PA-C  diclofenac (CATAFLAM) 50 MG tablet Take 1 tablet (50 mg total) by mouth 2 (two) times daily. 03/31/17   Vickki Hearing, MD  docusate sodium (COLACE) 100 MG capsule Take 1 capsule (100 mg total) by mouth 2 (two) times daily. 10/16/15   Lanney Gins, PA-C  doxycycline (VIBRAMYCIN) 100 MG capsule Take 1 capsule (100 mg total) by mouth 2 (two) times daily. 08/28/17   Linwood Dibbles, MD  ferrous sulfate 325 (65 FE) MG tablet Take 1 tablet (325 mg total) by mouth 3 (three) times daily after meals. 10/16/15   Lanney Gins, PA-C  polyethylene glycol (MIRALAX / GLYCOLAX) packet Take 17 g by mouth 2 (two) times daily.  10/16/15   Lanney GinsBabish, Matthew, PA-C  tiZANidine (ZANAFLEX) 4 MG tablet Take 1 tablet (4 mg total) by mouth every 6 (six) hours as needed for muscle spasms. 10/16/15   Lanney GinsBabish, Matthew, PA-C  traMADol (ULTRAM) 50 MG tablet Take 1-2 tablets (50-100 mg total) by mouth every 6 (six) hours as needed. 10/18/15   Lanney GinsBabish, Matthew, PA-C    Inpatient Medications: Scheduled Meds:  Continuous Infusions:  PRN Meds:   Allergies:    Allergies  Allergen Reactions  . Sulfonamide Derivatives Itching and Swelling  . Alphagan [Brimonidine] Itching and Rash  . Cosopt [Dorzolamide Hcl-Timolol  Mal] Itching, Swelling and Rash  . Lumigan [Bimatoprost] Itching and Rash  . Pilocarpine Itching and Rash  . Timolol Itching and Rash    Social History:   Social History   Socioeconomic History  . Marital status: Married    Spouse name: Not on file  . Number of children: Not on file  . Years of education: Not on file  . Highest education level: Not on file  Occupational History  . Not on file  Social Needs  . Financial resource strain: Not on file  . Food insecurity:    Worry: Not on file    Inability: Not on file  . Transportation needs:    Medical: Not on file    Non-medical: Not on file  Tobacco Use  . Smoking status: Never Smoker  . Smokeless tobacco: Never Used  Substance and Sexual Activity  . Alcohol use: Yes    Comment: OCCASIONAL WINE  . Drug use: No  . Sexual activity: Not on file  Lifestyle  . Physical activity:    Days per week: Not on file    Minutes per session: Not on file  . Stress: Not on file  Relationships  . Social connections:    Talks on phone: Not on file    Gets together: Not on file    Attends religious service: Not on file    Active member of club or organization: Not on file    Attends meetings of clubs or organizations: Not on file    Relationship status: Not on file  . Intimate partner violence:    Fear of current or ex partner: Not on file    Emotionally abused: Not on file    Physically abused: Not on file    Forced sexual activity: Not on file  Other Topics Concern  . Not on file  Social History Narrative  . Not on file    Family History:   No Faulconer family history of heart disease  ROS:  Please see the history of present illness.  Positive for dizziness.  Denies chest pain fevers chills orthopnea shortness of breath headache weakness neuropathy. All other ROS reviewed and negative.     Physical Exam/Data:   Vitals:   10/23/17 0730 10/23/17 0759 10/23/17 0801  BP: 136/64  (!) 160/90  Pulse: 61 87 (!) 57  Resp: (!) 23  18 (!) 25  SpO2: 98% 94%    No intake or output data in the 24 hours ending 10/23/17 0823 There were no vitals filed for this visit. There is no height or weight on file to calculate BMI.  General: Thin, in no acute distress, elderly, emesis bag at her bedside HEENT: normal Lymph: no adenopathy Neck: no JVD Endocrine:  No thryomegaly Vascular: No carotid bruits  Cardiac:  normal S1, S2;sinus bradycardia, bradycardic rhythm currently; no significant murmur  Lungs:  clear to auscultation  bilaterally, no wheezing, rhonchi or rales  Abd: soft, nontender, no hepatomegaly  Ext: no edema Musculoskeletal:  No deformities, BUE and BLE strength normal and equal Skin: warm and dry  Neuro:  CNs 2-12 intact, no focal abnormalities noted Psych:  Normal affect   EKG:  The EKG was personally reviewed and demonstrates: Specific ST-T wave changes, normal PR interval  Telemetry:  Telemetry was personally reviewed and demonstrates: In emergency department she once again had approximately 10 seconds of cessation of QRS complexes with underlying P waves.  Transient 2-1 heart block, transient third-degree heart block as well.  Relevant CV Studies:  EMS strips demonstrated cessation of QRS complexes with underlying continuation of P waves and a sinus bradycardia formation.  No evidence of ST elevation.  Laboratory Data:  Chemistry Recent Labs  Lab 10/23/17 0754  NA 140  K 5.1  CL 106  GLUCOSE 168*  BUN 24*  CREATININE 0.80    No results for input(s): PROT, ALBUMIN, AST, ALT, ALKPHOS, BILITOT in the last 168 hours. Hematology Recent Labs  Lab 10/23/17 0744 10/23/17 0754  WBC 11.2*  --   RBC 4.67  --   HGB 14.3 14.3  HCT 44.9 42.0  MCV 96.1  --   MCH 30.6  --   MCHC 31.8  --   RDW 12.0  --   PLT 121*  --    Cardiac EnzymesNo results for input(s): TROPONINI in the last 168 hours.  Recent Labs  Lab 10/23/17 0752  TROPIPOC 0.00    BNPNo results for input(s): BNP, PROBNP in the last  168 hours.  DDimer No results for input(s): DDIMER in the last 168 hours.  Radiology/Studies:  No results found.  Assessment and Plan:   Heart block - Had periods of P waves with no underlying QRS, approximately 10 to 15 seconds duration.  Slight widening of PR interval prior to absence of QRS.  Up until now, she has been normal without any syncopal episodes.  She is not on any beta-blocker.  No prior thyroid abnormality.  She did interestingly have a tick bite in 08/28/2017 ER visit. -I discussed withEP, Dr. Johney Frame.  We will go ahead given her instability and place a temporary pacemaker wire.  She will then be transition to permanent pacemaker.  I have discussed this with her, risks and benefits and she is willing to proceed. -TSH has been ordered. - Keep her n.p.o.   Critical care time 40 minutes-spent with patient, data review, labs, discussion with ER physician, coordination with EP as well as Dr. Okey Dupre with interventional cardiology for placement of pacemaker in this woman with critical heart block.   For questions or updates, please contact [facility] Please consult [email or web address] for contact info under Cardiology/STEMI.   Signed, Donato Schultz, MD  10/23/2017 8:23 AM

## 2017-10-23 NOTE — Plan of Care (Signed)
To cath lab.

## 2017-10-23 NOTE — Progress Notes (Signed)
  Echocardiogram 2D Echocardiogram has been performed.  Delcie RochENNINGTON, Arielle Eber 10/23/2017, 3:25 PM

## 2017-10-23 NOTE — H&P (View-Only) (Signed)
ELECTROPHYSIOLOGY CONSULT NOTE    Primary Care Physician: Benita Stabile, MD Referring Physician:  Dr Anne Fu Admit Date: 10/23/2017  Reason for consultation:  AV block  [name] [name] is a 84 y.o. female who is admitted with symptomatic transient complete heart block.  She is active and continues to drive.  She reports having abrupt onset of dizziness with presyncope this am.  She presented to [facility] where she was found to have intermittent complete heart block with syncope.  No reversible causes have been found.  She underwent temp wire placement by Dr End.   She has had frequent non capture from the temp wire.  EP is therefore consulted for urgent ppm implantation.   Today, she denies symptoms of palpitations, chest pain, shortness of breath, orthopnea, PND, lower extremity edema, or neurologic sequela. The patient is tolerating medications without difficulties and is otherwise without complaint today.   Past Medical History:  Diagnosis Date  . Arthritis   . Complication of anesthesia    pt states she had trouble waking up  . Frequency of urination   . Glaucoma   . Osteoporosis    Past Surgical History:  Procedure Laterality Date  . BACK SURGERY  1967 / 1970'S   X 2  . CHOLECYSTECTOMY  1980'S  . EYE SURGERY     cataract and lasik for glaucoma  . TOTAL HIP ARTHROPLASTY Right 08/01/2013   Procedure: TOTAL RIGHT HIP ARTHROPLASTY ANTERIOR APPROACH;  Surgeon: Shelda Pal, MD;  Location: [facility];  Service: Orthopedics;  Laterality: Right;  . TOTAL HIP ARTHROPLASTY Left 10/15/2015   Procedure: LEFT TOTAL HIP ARTHROPLASTY ANTERIOR APPROACH;  Surgeon: Durene Romans, MD;  Location: [facility];  Service: Orthopedics;  Laterality: Left;  . WRIST FRACTURE SURGERY      . acetaminophen  1,000 mg Oral Q8H  . docusate sodium  100 mg Oral BID  . ferrous sulfate  325 mg Oral TID PC  . polyethylene glycol  17 g Oral BID   . sodium chloride      Allergies  Allergen Reactions  . Sulfonamide  Derivatives Itching and Swelling  . Alphagan [Brimonidine] Itching and Rash  . Cosopt [Dorzolamide Hcl-Timolol Mal] Itching, Swelling and Rash  . Lumigan [Bimatoprost] Itching and Rash  . Pilocarpine Itching and Rash  . Timolol Itching and Rash    Social History   Socioeconomic History  . Marital status: Married    Spouse name: Not on file  . Number of children: Not on file  . Years of education: Not on file  . Highest education level: Not on file  Occupational History  . Not on file  Social Needs  . Financial resource strain: Not on file  . Food insecurity:    Worry: Not on file    Inability: Not on file  . Transportation needs:    Medical: Not on file    Non-medical: Not on file  Tobacco Use  . Smoking status: Never Smoker  . Smokeless tobacco: Never Used  Substance and Sexual Activity  . Alcohol use: Yes    Comment: OCCASIONAL WINE  . Drug use: No  . Sexual activity: Not on file  Lifestyle  . Physical activity:    Days per week: Not on file    Minutes per session: Not on file  . Stress: Not on file  Relationships  . Social connections:    Talks on phone: Not on file    Gets together: Not on file  Attends religious service: Not on file    Active member of club or organization: Not on file    Attends meetings of clubs or organizations: Not on file    Relationship status: Not on file  . Intimate partner violence:    Fear of current or ex partner: Not on file    Emotionally abused: Not on file    Physically abused: Not on file    Forced sexual activity: Not on file  Other Topics Concern  . Not on file  Social History Narrative  . Not on file    History reviewed. No pertinent family history.  ROS- All systems are reviewed and negative except as per the HPI above  Physical Exam: Telemetry:  V paced with interrment loss of capture and complete heart block Vitals:   10/23/17 1000 10/23/17 1015 10/23/17 1030 10/23/17 1045  BP: 137/88 137/88 (!) 126/93  124/83  Pulse: 80 80 79 80  Resp: 19 (!) 22 14 17   Temp:   (!) 96.1 F (35.6 C)   TempSrc:   Axillary   SpO2: 96% 97% 100% 99%    GEN- The patient is elderly and frail appearing, alert and oriented x 3 today.   Head- normocephalic, atraumatic Eyes-  Sclera clear, conjunctiva pink Ears- hearing intact Oropharynx- clear Neck- supple, R IJ catheter in place Lungs- Clear to ausculation bilaterally, normal work of breathing Heart- Regular rate and rhythm, no murmurs, rubs or gallops, PMI not laterally displaced GI- soft, NT, ND, + BS Extremities- no clubbing, cyanosis, or edema MS- diffuse atrophy Skin- no rash or lesion Psych- euthymic mood, full affect Neuro- strength and sensation are intact  EKG- sinus 60 bpm  Labs:   Lab Results  Component Value Date   WBC 11.2 (H) 10/23/2017   HGB 14.3 10/23/2017   HCT 42.0 10/23/2017   MCV 96.1 10/23/2017   PLT 121 (L) 10/23/2017    Recent Labs  Lab 10/23/17 0744 10/23/17 0754  NA 141 140  K 4.9 5.1  CL 109 106  CO2 24  --   BUN 17 24*  CREATININE 0.88 0.80  CALCIUM 8.6*  --   PROT 5.9*  --   BILITOT 1.1  --   ALKPHOS 55  --   ALT 14  --   AST 28  --   GLUCOSE 178* 168*     Radiology:  CXR reviewed  Echo:  Pending.  Per Dr End, preliminary finding is normal EF  ASSESSMENT AND PLAN:   1. Symptomatic transient complete heart block with syncope The patient has symptomatic bradycardia.  I would therefore recommend pacemaker implantation at this time.  Risks, benefits, alternatives to pacemaker implantation were discussed in detail with the patient and family today. The patient understands that the risks include but are not limited to bleeding, infection, pneumothorax, perforation, tamponade, vascular damage, renal failure, MI, stroke, death,  and lead dislodgement and wishes to proceed. We will therefore proceed with urgent PPM.  Very complicated patient. She is at risk for decompensation/ death.  She is high risk for  procedures.  A high level of decision making was required for this encounter, including discussions with Drs Anne FuSkains and End.   Hillis RangeJames Israella Hubert, MD 10/23/2017  10:48 AM

## 2017-10-23 NOTE — Interval H&P Note (Signed)
History and Physical Interval Note:  10/23/2017 10:55 AM  [name] [name]  has presented today for surgery, with the diagnosis of syncope  The various methods of treatment have been discussed with the patient and family. After consideration of risks, benefits and other options for treatment, the patient has consented to  Procedure(s): PACEMAKER IMPLANT (N/A) as a surgical intervention .  The patient's history has been reviewed, patient examined, no change in status, stable for surgery.  I have reviewed the patient's chart and labs.  Questions were answered to the patient's satisfaction.     Hillis RangeJames Izaiah Tabb

## 2017-10-23 NOTE — Procedures (Signed)
Cannot do Echocardiogram at this time since patient is in Cath Lab.

## 2017-10-23 NOTE — Progress Notes (Signed)
Limited Bedside Echo  Date: 10/23/17 Time: 9:39 AM  Limited bedside echo shows normal LVEF (>55%).  No significant pericardial effusion.  Formal echo pending.  Yvonne Kendallhristopher Ayva Veilleux, MD [facility] Pager: [phone]

## 2017-10-23 NOTE — Consult Note (Signed)
ELECTROPHYSIOLOGY CONSULT NOTE    Primary Care Physician: Benita Stabile, MD Referring Physician:  Dr Anne Fu Admit Date: 10/23/2017  Reason for consultation:  AV block  [name] [name] is a 84 y.o. female who is admitted with symptomatic transient complete heart block.  She is active and continues to drive.  She reports having abrupt onset of dizziness with presyncope this am.  She presented to [facility] where she was found to have intermittent complete heart block with syncope.  No reversible causes have been found.  She underwent temp wire placement by Dr End.   She has had frequent non capture from the temp wire.  EP is therefore consulted for urgent ppm implantation.   Today, she denies symptoms of palpitations, chest pain, shortness of breath, orthopnea, PND, lower extremity edema, or neurologic sequela. The patient is tolerating medications without difficulties and is otherwise without complaint today.   Past Medical History:  Diagnosis Date  . Arthritis   . Complication of anesthesia    pt states she had trouble waking up  . Frequency of urination   . Glaucoma   . Osteoporosis    Past Surgical History:  Procedure Laterality Date  . BACK SURGERY  1967 / 1970'S   X 2  . CHOLECYSTECTOMY  1980'S  . EYE SURGERY     cataract and lasik for glaucoma  . TOTAL HIP ARTHROPLASTY Right 08/01/2013   Procedure: TOTAL RIGHT HIP ARTHROPLASTY ANTERIOR APPROACH;  Surgeon: Shelda Pal, MD;  Location: [facility];  Service: Orthopedics;  Laterality: Right;  . TOTAL HIP ARTHROPLASTY Left 10/15/2015   Procedure: LEFT TOTAL HIP ARTHROPLASTY ANTERIOR APPROACH;  Surgeon: Durene Romans, MD;  Location: [facility];  Service: Orthopedics;  Laterality: Left;  . WRIST FRACTURE SURGERY      . acetaminophen  1,000 mg Oral Q8H  . docusate sodium  100 mg Oral BID  . ferrous sulfate  325 mg Oral TID PC  . polyethylene glycol  17 g Oral BID   . sodium chloride      Allergies  Allergen Reactions  . Sulfonamide  Derivatives Itching and Swelling  . Alphagan [Brimonidine] Itching and Rash  . Cosopt [Dorzolamide Hcl-Timolol Mal] Itching, Swelling and Rash  . Lumigan [Bimatoprost] Itching and Rash  . Pilocarpine Itching and Rash  . Timolol Itching and Rash    Social History   Socioeconomic History  . Marital status: Married    Spouse name: Not on file  . Number of children: Not on file  . Years of education: Not on file  . Highest education level: Not on file  Occupational History  . Not on file  Social Needs  . Financial resource strain: Not on file  . Food insecurity:    Worry: Not on file    Inability: Not on file  . Transportation needs:    Medical: Not on file    Non-medical: Not on file  Tobacco Use  . Smoking status: Never Smoker  . Smokeless tobacco: Never Used  Substance and Sexual Activity  . Alcohol use: Yes    Comment: OCCASIONAL WINE  . Drug use: No  . Sexual activity: Not on file  Lifestyle  . Physical activity:    Days per week: Not on file    Minutes per session: Not on file  . Stress: Not on file  Relationships  . Social connections:    Talks on phone: Not on file    Gets together: Not on file  Attends religious service: Not on file    Active member of club or organization: Not on file    Attends meetings of clubs or organizations: Not on file    Relationship status: Not on file  . Intimate partner violence:    Fear of current or ex partner: Not on file    Emotionally abused: Not on file    Physically abused: Not on file    Forced sexual activity: Not on file  Other Topics Concern  . Not on file  Social History Narrative  . Not on file    History reviewed. No pertinent family history.  ROS- All systems are reviewed and negative except as per the HPI above  Physical Exam: Telemetry:  V paced with interrment loss of capture and complete heart block Vitals:   10/23/17 1000 10/23/17 1015 10/23/17 1030 10/23/17 1045  BP: 137/88 137/88 (!) 126/93  124/83  Pulse: 80 80 79 80  Resp: 19 (!) 22 14 17   Temp:   (!) 96.1 F (35.6 C)   TempSrc:   Axillary   SpO2: 96% 97% 100% 99%    GEN- The patient is elderly and frail appearing, alert and oriented x 3 today.   Head- normocephalic, atraumatic Eyes-  Sclera clear, conjunctiva pink Ears- hearing intact Oropharynx- clear Neck- supple, R IJ catheter in place Lungs- Clear to ausculation bilaterally, normal work of breathing Heart- Regular rate and rhythm, no murmurs, rubs or gallops, PMI not laterally displaced GI- soft, NT, ND, + BS Extremities- no clubbing, cyanosis, or edema MS- diffuse atrophy Skin- no rash or lesion Psych- euthymic mood, full affect Neuro- strength and sensation are intact  EKG- sinus 60 bpm  Labs:   Lab Results  Component Value Date   WBC 11.2 (H) 10/23/2017   HGB 14.3 10/23/2017   HCT 42.0 10/23/2017   MCV 96.1 10/23/2017   PLT 121 (L) 10/23/2017    Recent Labs  Lab 10/23/17 0744 10/23/17 0754  NA 141 140  K 4.9 5.1  CL 109 106  CO2 24  --   BUN 17 24*  CREATININE 0.88 0.80  CALCIUM 8.6*  --   PROT 5.9*  --   BILITOT 1.1  --   ALKPHOS 55  --   ALT 14  --   AST 28  --   GLUCOSE 178* 168*     Radiology:  CXR reviewed  Echo:  Pending.  Per Dr End, preliminary finding is normal EF  ASSESSMENT AND PLAN:   1. Symptomatic transient complete heart block with syncope The patient has symptomatic bradycardia.  I would therefore recommend pacemaker implantation at this time.  Risks, benefits, alternatives to pacemaker implantation were discussed in detail with the patient and family today. The patient understands that the risks include but are not limited to bleeding, infection, pneumothorax, perforation, tamponade, vascular damage, renal failure, MI, stroke, death,  and lead dislodgement and wishes to proceed. We will therefore proceed with urgent PPM.  Very complicated patient. She is at risk for decompensation/ death.  She is high risk for  procedures.  A high level of decision making was required for this encounter, including discussions with Drs Anne FuSkains and End.   [name] RangeJames Tehillah Cipriani, MD 10/23/2017  10:48 AM

## 2017-10-23 NOTE — Significant Event (Signed)
[facility]  Date: 10/23/17 Time: 10:16 AM  I was alerted by patient's RN that pacemaker was no longer capturing and the patient had recurrent pauses with LOC.  Pacemaker repositioned at the bedside with capture at 15 mA.  Device settings increased to rate of 80 bpm and output of 20 mA.  STAT CXR ordered.  Events discussed with Dr. Johney FrameAllred.  He and EP team are on their way in to evaluate the patient and place permanent pacemaker.  Patient and family updated.  Yvonne Kendallhristopher Browning Southwood, MD [facility] Pager: [phone]

## 2017-10-23 NOTE — H&P (Signed)
History and Physical    [name] [name] YNW:[id number] DOB: 05/17/1923 DOA: 10/23/2017  PCP: Benita Stabile, MD Patient coming from: home  Chief Complaint: generalized weakness/dizziness  HPI: [name] [name] is a very pleasant 84 y.o. female with basically NO  medical history dense to the emergency department via EMS with the chief complaint of vertigo. Initial evaluation reveals bradycardia and concern for heart block. [facility] are asked to admit  Information is obtained from the patient and the chart. She states she was in her usual state of health until this morning she got out of bed and felt quite dizzy. Associated symptoms include nausea without vomiting and mild shortness of breath. She denied headache chest pain palpitations. She denied abdominal pain diarrhea constipation melena. She denies any fever chills cough lower extremity edema.EMS reports upon arrival her cardiac rhythm range from sinus bradycardia to sinus rhythm. EMS also reported when she stood up rhythm changed to second or third-degree heart block and she vomited.addition he MS reports just prior to arriving at the emergency department she had a ventricular pause. He is provided with Zofran,250 mL of normal saline.    ED Course: in the emergency department she's afebrile hemodynamically stable with intermittent bradycardia not hypoxic.  Review of Systems: As per HPI otherwise all other systems reviewed and are negative.   Ambulatory Status: he lives at home alone and ambulates independently no recent falls is independent with ADLs still drives active in her garden maintains her own household  Past Medical History:  Diagnosis Date  . Arthritis   . Complication of anesthesia    pt states she had trouble waking up  . Frequency of urination   . Glaucoma   . Osteoporosis     Past Surgical History:  Procedure Laterality Date  . BACK SURGERY  1967 / 1970'S   X 2  . CHOLECYSTECTOMY  1980'S  . EYE SURGERY       cataract and lasik for glaucoma  . TOTAL HIP ARTHROPLASTY Right 08/01/2013   Procedure: TOTAL RIGHT HIP ARTHROPLASTY ANTERIOR APPROACH;  Surgeon: Shelda Pal, MD;  Location: [facility];  Service: Orthopedics;  Laterality: Right;  . TOTAL HIP ARTHROPLASTY Left 10/15/2015   Procedure: LEFT TOTAL HIP ARTHROPLASTY ANTERIOR APPROACH;  Surgeon: Durene Romans, MD;  Location: [facility];  Service: Orthopedics;  Laterality: Left;  . WRIST FRACTURE SURGERY      Social History   Socioeconomic History  . Marital status: Married    Spouse name: Not on file  . Number of children: Not on file  . Years of education: Not on file  . Highest education level: Not on file  Occupational History  . Not on file  Social Needs  . Financial resource strain: Not on file  . Food insecurity:    Worry: Not on file    Inability: Not on file  . Transportation needs:    Medical: Not on file    Non-medical: Not on file  Tobacco Use  . Smoking status: Never Smoker  . Smokeless tobacco: Never Used  Substance and Sexual Activity  . Alcohol use: Yes    Comment: OCCASIONAL WINE  . Drug use: No  . Sexual activity: Not on file  Lifestyle  . Physical activity:    Days per week: Not on file    Minutes per session: Not on file  . Stress: Not on file  Relationships  . Social connections:    Talks on phone: Not on file  Gets together: Not on file    Attends religious service: Not on file    Active member of club or organization: Not on file    Attends meetings of clubs or organizations: Not on file    Relationship status: Not on file  . Intimate partner violence:    Fear of current or ex partner: Not on file    Emotionally abused: Not on file    Physically abused: Not on file    Forced sexual activity: Not on file  Other Topics Concern  . Not on file  Social History Narrative  . Not on file    Allergies  Allergen Reactions  . Sulfonamide Derivatives Itching and Swelling  . Alphagan [Brimonidine] Itching  and Rash  . Cosopt [Dorzolamide Hcl-Timolol Mal] Itching, Swelling and Rash  . Lumigan [Bimatoprost] Itching and Rash  . Pilocarpine Itching and Rash  . Timolol Itching and Rash    History reviewed. No pertinent family history.  Prior to Admission medications   Medication Sig Start Date End Date Taking? Authorizing Provider  acetaminophen (TYLENOL) 500 MG tablet Take 2 tablets (1,000 mg total) by mouth every 8 (eight) hours. 10/18/15   Lanney Gins, PA-C  diclofenac (CATAFLAM) 50 MG tablet Take 1 tablet (50 mg total) by mouth 2 (two) times daily. 03/31/17   Vickki Hearing, MD  docusate sodium (COLACE) 100 MG capsule Take 1 capsule (100 mg total) by mouth 2 (two) times daily. 10/16/15   Lanney Gins, PA-C  doxycycline (VIBRAMYCIN) 100 MG capsule Take 1 capsule (100 mg total) by mouth 2 (two) times daily. 08/28/17   Linwood Dibbles, MD  ferrous sulfate 325 (65 FE) MG tablet Take 1 tablet (325 mg total) by mouth 3 (three) times daily after meals. 10/16/15   Lanney Gins, PA-C  polyethylene glycol (MIRALAX / GLYCOLAX) packet Take 17 g by mouth 2 (two) times daily. 10/16/15   Lanney Gins, PA-C  tiZANidine (ZANAFLEX) 4 MG tablet Take 1 tablet (4 mg total) by mouth every 6 (six) hours as needed for muscle spasms. 10/16/15   Lanney Gins, PA-C  traMADol (ULTRAM) 50 MG tablet Take 1-2 tablets (50-100 mg total) by mouth every 6 (six) hours as needed. 10/18/15   Lanney Gins, PA-C    Physical Exam: Vitals:   10/23/17 0730 10/23/17 0759 10/23/17 0801  BP: 136/64  (!) 160/90  Pulse: 61 87 (!) 57  Resp: (!) 23 18 (!) 25  SpO2: 98% 94%      General:  Appears slightly anxious but in no acute distress Eyes:  PERRL, EOMI, normal lids, iris ENT:  grossly normal hearing, lips & tongue, mucous membranes of her mouth are slightly dry Neck:  no LAD, masses or thyromegaly Cardiovascular:  bradycardia no m/r/g. No LE edema.  Respiratory:  CTA bilaterally, no w/r/r. Normal respiratory  effort. Abdomen:  soft, ntnd, sluggish bowel sounds no guarding or rebounding Skin:  no rash or induration seen on limited exam Musculoskeletal:  grossly normal tone BUE/BLE, good ROM, no bony abnormality Psychiatric:  grossly normal mood and affect, speech fluent and appropriate, AOx3 Neurologic:  CN 2-12 grossly intact, moves all extremities in coordinated fashion, sensation intact peach clear facial symmetry moving all extremities follows commands  Labs on Admission: I have personally reviewed following labs and imaging studies  CBC: Recent Labs  Lab 10/23/17 0744 10/23/17 0754  WBC 11.2*  --   NEUTROABS 6.3  --   HGB 14.3 14.3  HCT 44.9 42.0  MCV 96.1  --  PLT 121*  --    Basic Metabolic Panel: Recent Labs  Lab 10/23/17 0754  NA 140  K 5.1  CL 106  GLUCOSE 168*  BUN 24*  CREATININE 0.80   GFR: CrCl cannot be calculated (Unknown ideal weight.). Liver Function Tests: No results for input(s): AST, ALT, ALKPHOS, BILITOT, PROT, ALBUMIN in the last 168 hours. No results for input(s): LIPASE, AMYLASE in the last 168 hours. No results for input(s): AMMONIA in the last 168 hours. Coagulation Profile: No results for input(s): INR, PROTIME in the last 168 hours. Cardiac Enzymes: No results for input(s): CKTOTAL, CKMB, CKMBINDEX, TROPONINI in the last 168 hours. BNP (last 3 results) No results for input(s): PROBNP in the last 8760 hours. HbA1C: No results for input(s): HGBA1C in the last 72 hours. CBG: No results for input(s): GLUCAP in the last 168 hours. Lipid Profile: No results for input(s): CHOL, HDL, LDLCALC, TRIG, CHOLHDL, LDLDIRECT in the last 72 hours. Thyroid Function Tests: No results for input(s): TSH, T4TOTAL, FREET4, T3FREE, THYROIDAB in the last 72 hours. Anemia Panel: No results for input(s): VITAMINB12, FOLATE, FERRITIN, TIBC, IRON, RETICCTPCT in the last 72 hours. Urine analysis:    Component Value Date/Time   COLORURINE YELLOW 07/24/2013 1124    APPEARANCEUR CLEAR 07/24/2013 1124   LABSPEC 1.012 07/24/2013 1124   PHURINE 5.5 07/24/2013 1124   GLUCOSEU NEGATIVE 07/24/2013 1124   HGBUR NEGATIVE 07/24/2013 1124   BILIRUBINUR NEGATIVE 07/24/2013 1124   KETONESUR NEGATIVE 07/24/2013 1124   PROTEINUR NEGATIVE 07/24/2013 1124   UROBILINOGEN 0.2 07/24/2013 1124   NITRITE NEGATIVE 07/24/2013 1124   LEUKOCYTESUR NEGATIVE 07/24/2013 1124    Creatinine Clearance: CrCl cannot be calculated (Unknown ideal weight.).  Sepsis Labs: @LABRCNTIP (procalcitonin:4,lacticidven:4) )No results found for this or any previous visit (from the past 240 hour(s)).   Radiological Exams on Admission: Dg Chest Portable 1 View  Result Date: 10/23/2017 CLINICAL DATA:  Patient states a history of Lyme disease and to a tick off her left thigh yesterday. EXAM: PORTABLE CHEST 1 VIEW COMPARISON:  11/18/2009 FINDINGS: Patient is rotated to the right. Lungs are adequately inflated with subtle hazy density over the right apex and bilateral lung bases which may be due to an infectious or inflammatory process. No effusion. Mild cardiomegaly. Mild degenerate change of the spine. IMPRESSION: Mild hazy opacification over the right apex and bilateral bases which may be due to an infectious or inflammatory process. AP and lateral chest radiograph with improved positioning on the frontal film would be helpful for better evaluation. Mild cardiomegaly. Electronically Signed   By: Elberta Fortis M.D.   On: 10/23/2017 08:24    EKG: Independently reviewed. Sinus rhythm Low voltage, precordial leads Borderline T abnormalities, anterior leads  Assessment/Plan Principal Problem:   Heart block Active Problems:   Syncope   Nausea and vomiting   #1. Heart block/bradycardia. EKG SR at 60. Did not on any nodal blocking medications. Reports she is not on any medications at all. Evaluated by cardiology in the emergency department. Taken to cath lab for temporary pacemaker pending further  evaluation -Defer plan to cardiology -will need transferring to cards service post procedure as she is going to [facility] ([facility])  #2. Nausea and vomiting. Related to #1. -zofran  #3. Syncope. Only 1 episode with EMS. Likely related to above. No injury.  No signs of infection, no metabolic derangement -see above   DVT prophylaxis: scd  Code Status: full  Family Communication: sister at bedside  Disposition Plan: to be determined  Consults called: skains and end cardiology  Admission status: inpatient    Gwenyth BenderBLACK,KAREN M MD [facility]  If 7PM-7AM, please contact night-coverage [email or web address] Password [email or web address]  10/23/2017, 8:42 AM

## 2017-10-24 ENCOUNTER — Inpatient Hospital Stay (HOSPITAL_COMMUNITY): Payer: Medicare Other

## 2017-10-24 DIAGNOSIS — I459 Conduction disorder, unspecified: Secondary | ICD-10-CM

## 2017-10-24 LAB — CBC
HCT: 42.4 % (ref 36.0–46.0)
Hemoglobin: 13.7 g/dL (ref 12.0–15.0)
MCH: 30.4 pg (ref 26.0–34.0)
MCHC: 32.3 g/dL (ref 30.0–36.0)
MCV: 94 fL (ref 78.0–100.0)
Platelets: 132 10*3/uL — ABNORMAL LOW (ref 150–400)
RBC: 4.51 MIL/uL (ref 3.87–5.11)
RDW: 12 % (ref 11.5–15.5)
WBC: 11.6 10*3/uL — ABNORMAL HIGH (ref 4.0–10.5)

## 2017-10-24 LAB — BASIC METABOLIC PANEL
Anion gap: 9 (ref 5–15)
BUN: 13 mg/dL (ref 6–20)
CO2: 25 mmol/L (ref 22–32)
Calcium: 8.7 mg/dL — ABNORMAL LOW (ref 8.9–10.3)
Chloride: 106 mmol/L (ref 101–111)
Creatinine, Ser: 0.85 mg/dL (ref 0.44–1.00)
GFR calc Af Amer: >60 mL/min (ref >60)
GFR calc non Af Amer: 58 mL/min — ABNORMAL LOW (ref >60)
Glucose, Bld: 101 mg/dL — ABNORMAL HIGH (ref 65–99)
Potassium: 4.2 mmol/L (ref 3.5–5.1)
Sodium: 140 mmol/L (ref 135–145)

## 2017-10-24 NOTE — Discharge Instructions (Signed)
° ° °  Supplemental Discharge Instructions for  Pacemaker Patients  Activity No heavy lifting or vigorous activity with your left/right arm for 6 to 8 weeks.  Do not raise your left/right arm above your head for one week.  Gradually raise your affected arm as drawn below.           __       10/27/17                    10/28/17                   10/29/17                   10/30/17  NO DRIVING for   1 week  ; you may begin driving on  1/61/096/29/19   .  WOUND CARE - Keep the wound area clean and dry.  Do not get this area wet for one week. No showers for one week; you may shower on  10/30/17   . - The tape/steri-strips on your wound will fall off; do not pull them off.  No bandage is needed on the site.  DO  NOT apply any creams, oils, or ointments to the wound area. - If you notice any drainage or discharge from the wound, any swelling or bruising at the site, or you develop a fever > 101? F after you are discharged home, call the office at once.  Special Instructions - You are still able to use cellular telephones; use the ear opposite the side where you have your pacemaker.  Avoid carrying your cellular phone near your device. - When traveling through airports, show security personnel your identification card to avoid being screened in the metal detectors.  Ask the security personnel to use the hand wand. - Avoid arc welding equipment, MRI testing (magnetic resonance imaging), TENS units (transcutaneous nerve stimulators).  Call the office for questions about other devices. - Avoid electrical appliances that are in poor condition or are not properly grounded. - Microwave ovens are safe to be near or to operate.

## 2017-10-24 NOTE — Discharge Summary (Signed)
ELECTROPHYSIOLOGY PROCEDURE DISCHARGE SUMMARY    Patient ID: [name] [name],  MRN: [id number], DOB/AGE: 07/09/1923 84 y.o.  Admit date: 10/23/2017 Discharge date: 10/24/2017  Primary Care Physician: Benita Stabile, MD  Primary Discharge Diagnosis:  Transient complete heart block with syncope  Secondary Discharge Diagnosis:  arthritis  Allergies  Allergen Reactions  . Sulfonamide Derivatives Itching and Swelling  . Alphagan [Brimonidine] Itching and Rash  . Cosopt [Dorzolamide Hcl-Timolol Mal] Itching, Swelling and Rash  . Lumigan [Bimatoprost] Itching and Rash  . Pilocarpine Itching and Rash  . Timolol Itching and Rash     Procedures This Admission:  1.  Implantation of a [employer] Assurity MRI dual chamber PPM on 10/23/17 by Dr Johney Frame.  The patient received a Tendril MRI model [id number]- [id number] (serial number  [id number]) right atrial lead **and Tendril MRI model [id number]  (serial number  [id number]) right ventricular lead.  The leads were then connected to a [employer] [facility] Assurity MRI model [id number] (serial number [id number]) pacemaker.  2.  CXR on 10/24/17 demonstrated no pneumothorax status post device implantation.  3. Temporary Transvenous pacing was placed emergently 10/23/17 4. Echo 10/23/17 revealed preserved EF.  Brief HPI: [name] [name] is a 84 y.o. female who presented with abrupt onset of dizziness and presyncope.  She was found to have transient complete heart block as the cause.  Emergent temporary pacing wire was placed however this became unstable and she eventually required emergent placement of a dual chamber permanent pacemaker.  Hospital Course:  The patient was admitted and underwent implantation of a dual chamber PPM with details as outlined above. She was monitored on telemetry overnight which demonstrated sinus rhthm.  Left chest was without hematoma or ecchymosis.  The device was interrogated and found to be functioning normally.  CXR was obtained and  demonstrated no pneumothorax status post device implantation.  Wound care, arm mobility, and restrictions were reviewed with the patient.  The patient was examined and considered stable for discharge to home. At time of dischage, the patient was alert, ambulatory, and otherwise without complaint.  Physical Exam: Vitals:   10/24/17 0619 10/24/17 0700 10/24/17 0800 10/24/17 0900  BP: 110/60 109/60 (!) 114/54 118/69  Pulse:      Resp: 16 (!) 21 (!) 25 (!) 21  Temp:      TempSrc:      SpO2: 95% 92% 92% 97%  Weight:      Height:        GEN- The patient is elderly appearing, alert and oriented x 3 today.   HEENT: normocephalic, atraumatic; sclera clear, conjunctiva pink; hearing intact; oropharynx clear; neck supple, no JVP Lymph- no cervical lymphadenopathy Lungs- Clear to ausculation bilaterally, normal work of breathing.  No wheezes, rales, rhonchi Heart- Regular rate and rhythm, no murmurs, rubs or gallops, PMI not laterally displaced GI- soft, non-tender, non-distended, bowel sounds present, no hepatosplenomegaly Extremities- no clubbing, cyanosis, or edema; DP/PT/radial pulses 2+ bilaterally MS- no significant deformity or atrophy Skin- warm and dry, no rash or lesion, left chest without hematoma/ecchymosis Psych- euthymic mood, full affect Neuro- strength and sensation are intact   Labs:   Lab Results  Component Value Date   WBC 11.6 (H) 10/24/2017   HGB 13.7 10/24/2017   HCT 42.4 10/24/2017   MCV 94.0 10/24/2017   PLT 132 (L) 10/24/2017    Recent Labs  Lab 10/23/17 0744  10/24/17 0224  NA 141   < > 140  K 4.9   < >  4.2  CL 109   < > 106  CO2 24  --  25  BUN 17   < > 13  CREATININE 0.88   < > 0.85  CALCIUM 8.6*  --  8.7*  PROT 5.9*  --   --   BILITOT 1.1  --   --   ALKPHOS 55  --   --   ALT 14  --   --   AST 28  --   --   GLUCOSE 178*   < > 101*   < > = values in this interval not displayed.    Discharge Medications:  Allergies as of 10/24/2017       Reactions   Sulfonamide Derivatives Itching, Swelling   Alphagan [brimonidine] Itching, Rash   Cosopt [dorzolamide Hcl-timolol Mal] Itching, Swelling, Rash   Lumigan [bimatoprost] Itching, Rash   Pilocarpine Itching, Rash   Timolol Itching, Rash      Medication List    TAKE these medications   acetaminophen 500 MG tablet Commonly known as:  TYLENOL Take 2 tablets (1,000 mg total) by mouth every 8 (eight) hours.   diclofenac 50 MG tablet Commonly known as:  CATAFLAM Take 1 tablet (50 mg total) by mouth 2 (two) times daily.   docusate sodium 100 MG capsule Commonly known as:  COLACE Take 1 capsule (100 mg total) by mouth 2 (two) times daily.   doxycycline 100 MG capsule Commonly known as:  VIBRAMYCIN Take 1 capsule (100 mg total) by mouth 2 (two) times daily.   ferrous sulfate 325 (65 FE) MG tablet Take 1 tablet (325 mg total) by mouth 3 (three) times daily after meals.   polyethylene glycol packet Commonly known as:  MIRALAX / GLYCOLAX Take 17 g by mouth 2 (two) times daily.   tiZANidine 4 MG tablet Commonly known as:  ZANAFLEX Take 1 tablet (4 mg total) by mouth every 6 (six) hours as needed for muscle spasms.   traMADol 50 MG tablet Commonly known as:  ULTRAM Take 1-2 tablets (50-100 mg total) by mouth every 6 (six) hours as needed.       Disposition:   Follow-up Information    [facility]/2017.   Specialty:  Cardiology Why:  at 2:30PM  Contact information: [address], [address] [place] [id number] [phone]       Marinus Mawaylor, Gregg W, MD Follow up on 01/26/2018.   Specialty:  Cardiology Why:  at 11AM Contact information: [address] [place] [id number] [phone]           Duration of Discharge Encounter: Greater than 30 minutes including physician time.  Randolm IdolSigned,  Sharleen Szczesny MD 10/24/2017 11:19 AM

## 2017-10-24 NOTE — Plan of Care (Signed)
Pt understands importance of using her sling and not lifting her arm higher than her elbow.

## 2017-10-24 NOTE — Progress Notes (Addendum)
Progress Note   Subjective   Doing well today, the patient denies CP or SOB.  No new concerns  Inpatient Medications    Scheduled Meds: . docusate sodium  100 mg Oral BID  . ferrous sulfate  325 mg Oral TID PC  . sodium chloride flush  3 mL Intravenous Q12H   Continuous Infusions: . sodium chloride 10 mL/hr at 10/24/17 0400   PRN Meds: sodium chloride, acetaminophen, ondansetron (ZOFRAN) IV, sodium chloride flush   Vital Signs    Vitals:   10/24/17 0100 10/24/17 0200 10/24/17 0300 10/24/17 0400  BP:  119/65 103/60 106/62  Pulse:      Resp: 11 14 15 12   Temp:   98.7 F (37.1 C)   TempSrc:   Oral   SpO2: 92% 92% 91% 92%  Weight:      Height:        Intake/Output Summary (Last 24 hours) at 10/24/2017 0731 Last data filed at 10/24/2017 0400 Gross per 24 hour  Intake 649.72 ml  Output 200 ml  Net 449.72 ml   Filed Weights   10/23/17 2000  Weight: 133 lb 2.5 oz (60.4 kg)    Telemetry    sinus - Personally Reviewed  Physical Exam   GEN- The patient is well appearing, alert and oriented x 3 today.   Head- normocephalic, atraumatic Eyes-  Sclera clear, conjunctiva pink Ears- hearing intact Oropharynx- clear Neck- supple, Lungs- Clear to ausculation bilaterally, normal work of breathing Heart- Regular rate and rhythm  GI- soft, NT, ND, + BS Extremities- no clubbing, cyanosis, or edema  Device pocket is without hematoma   Labs    Chemistry Recent Labs  Lab 10/23/17 0744 10/23/17 0754 10/24/17 0224  NA 141 140 140  K 4.9 5.1 4.2  CL 109 106 106  CO2 24  --  25  GLUCOSE 178* 168* 101*  BUN 17 24* 13  CREATININE 0.88 0.80 0.85  CALCIUM 8.6*  --  8.7*  PROT 5.9*  --   --   ALBUMIN 3.6  --   --   AST 28  --   --   ALT 14  --   --   ALKPHOS 55  --   --   BILITOT 1.1  --   --   GFRNONAA 56*  --  58*  GFRAA >60  --  >60  ANIONGAP 8  --  9     Hematology Recent Labs  Lab 10/23/17 0744 10/23/17 0754 10/24/17 0224  WBC 11.2*  --  11.6*    RBC 4.67  --  4.51  HGB 14.3 14.3 13.7  HCT 44.9 42.0 42.4  MCV 96.1  --  94.0  MCH 30.6  --  30.4  MCHC 31.8  --  32.3  RDW 12.0  --  12.0  PLT 121*  --  132*    Cardiac EnzymesNo results for input(s): TROPONINI in the last 168 hours.  Recent Labs  Lab 10/23/17 0752  TROPIPOC 0.00    Echo is reviewed    Assessment & Plan    1.  Symptomatic transient complete heart block Doing well s/p PPM Device interrogation is reviewed and normal this am  Awaiting chest xray  Will reassess after CXR.  Hopefully to home later today.  Hillis RangeJames Loukas Antonson MD, [facility] 10/24/2017 7:31 AM   Addendum: CXR reveals stable leads, no ptx.  Patient is ambulatory without complaint.  DC to home.  Routine wound care and follow-up  Hillis RangeJames Clarrisa Kaylor  MD, [facility] 10/24/2017 11:09 AM

## 2017-10-25 ENCOUNTER — Encounter (HOSPITAL_COMMUNITY): Payer: Self-pay | Admitting: Internal Medicine

## 2017-10-25 MED FILL — Lidocaine HCl Local Inj 1%: INTRAMUSCULAR | Qty: 80 | Status: AC

## 2017-10-25 MED FILL — Lidocaine HCl Local Preservative Free (PF) Inj 1%: INTRAMUSCULAR | Qty: 30 | Status: AC

## 2017-11-03 ENCOUNTER — Ambulatory Visit (INDEPENDENT_AMBULATORY_CARE_PROVIDER_SITE_OTHER): Payer: Medicare Other | Admitting: *Deleted

## 2017-11-03 DIAGNOSIS — I442 Atrioventricular block, complete: Secondary | ICD-10-CM | POA: Diagnosis not present

## 2017-11-03 DIAGNOSIS — R55 Syncope and collapse: Secondary | ICD-10-CM | POA: Diagnosis not present

## 2017-11-03 LAB — CUP PACEART INCLINIC DEVICE CHECK
Battery Remaining Longevity: 138 mo
Battery Voltage: 3.11 V
Brady Statistic RA Percent Paced: 7.4 %
Brady Statistic RV Percent Paced: 0.1 %
Date Time Interrogation Session: 20190703151532
Implantable Lead Implant Date: 20190622
Implantable Lead Implant Date: 20190622
Implantable Lead Location: 753859
Implantable Lead Location: 753860
Implantable Pulse Generator Implant Date: 20190622
Lead Channel Impedance Value: 412.5 Ohm
Lead Channel Impedance Value: 575 Ohm
Lead Channel Pacing Threshold Amplitude: 0.5 V
Lead Channel Pacing Threshold Amplitude: 0.5 V
Lead Channel Pacing Threshold Amplitude: 0.75 V
Lead Channel Pacing Threshold Amplitude: 0.75 V
Lead Channel Pacing Threshold Pulse Width: 0.5 ms
Lead Channel Pacing Threshold Pulse Width: 0.5 ms
Lead Channel Pacing Threshold Pulse Width: 0.5 ms
Lead Channel Pacing Threshold Pulse Width: 0.5 ms
Lead Channel Sensing Intrinsic Amplitude: 0.6 mV
Lead Channel Sensing Intrinsic Amplitude: 12 mV
Lead Channel Setting Pacing Amplitude: 3.5 V
Lead Channel Setting Pacing Amplitude: 3.5 V
Lead Channel Setting Pacing Pulse Width: 0.5 ms
Lead Channel Setting Sensing Sensitivity: 2 mV
Pulse Gen Model: 2272
Pulse Gen Serial Number: 9032820

## 2017-11-03 NOTE — Progress Notes (Signed)
Wound check appointment. Steri-strips removed. Wound without redness or edema. Incision edges approximated, wound well healed. Normal device function. Thresholds, sensing, and impedances consistent with implant measurements. Device programmed at 3.5V for extra safety margin until 3 month visit. Histogram distribution appropriate for patient and level of activity. 1 mode switches- 4 sec AT. No high ventricular rates noted. Patient educated about wound care, arm mobility, lifting restrictions and Merlin remote monitoring. ROV with GT in [place] 01/26/18.

## 2017-11-12 DIAGNOSIS — Z6823 Body mass index (BMI) 23.0-23.9, adult: Secondary | ICD-10-CM | POA: Diagnosis not present

## 2017-11-12 DIAGNOSIS — N390 Urinary tract infection, site not specified: Secondary | ICD-10-CM | POA: Diagnosis not present

## 2017-11-17 DIAGNOSIS — S30860A Insect bite (nonvenomous) of lower back and pelvis, initial encounter: Secondary | ICD-10-CM | POA: Diagnosis not present

## 2017-11-17 DIAGNOSIS — M79644 Pain in right finger(s): Secondary | ICD-10-CM | POA: Diagnosis not present

## 2017-11-17 DIAGNOSIS — Z23 Encounter for immunization: Secondary | ICD-10-CM | POA: Diagnosis not present

## 2017-11-17 DIAGNOSIS — Z6823 Body mass index (BMI) 23.0-23.9, adult: Secondary | ICD-10-CM | POA: Diagnosis not present

## 2017-11-17 DIAGNOSIS — W57XXXA Bitten or stung by nonvenomous insect and other nonvenomous arthropods, initial encounter: Secondary | ICD-10-CM | POA: Diagnosis not present

## 2017-11-17 DIAGNOSIS — Z950 Presence of cardiac pacemaker: Secondary | ICD-10-CM | POA: Diagnosis not present

## 2017-12-02 ENCOUNTER — Other Ambulatory Visit: Payer: Self-pay

## 2018-01-26 ENCOUNTER — Ambulatory Visit (INDEPENDENT_AMBULATORY_CARE_PROVIDER_SITE_OTHER): Payer: Medicare Other | Admitting: Internal Medicine

## 2018-01-26 ENCOUNTER — Encounter: Payer: Self-pay | Admitting: Internal Medicine

## 2018-01-26 VITALS — BP 136/80 | HR 64 | Ht 63.0 in | Wt 131.4 lb

## 2018-01-26 DIAGNOSIS — I442 Atrioventricular block, complete: Secondary | ICD-10-CM

## 2018-01-26 NOTE — Progress Notes (Signed)
HPI [name] [name] returns today for followup. She developed symptomatic bradycardia due to CHB and underwent PPM insertion 3 months ago. She has done well in the interim except she notes some fatigue when she wakes up. No chest pain or sob.  Allergies  Allergen Reactions  . Sulfonamide Derivatives Itching and Swelling  . Alphagan [Brimonidine] Itching and Rash  . Cosopt [Dorzolamide Hcl-Timolol Mal] Itching, Swelling and Rash  . Lumigan [Bimatoprost] Itching and Rash  . Pilocarpine Itching and Rash  . Timolol Itching and Rash     No current outpatient medications on file.   No current facility-administered medications for this visit.      Past Medical History:  Diagnosis Date  . Arthritis   . Complication of anesthesia    pt states she had trouble waking up  . Frequency of urination   . Glaucoma   . Osteoporosis     ROS:   All systems reviewed and negative except as noted in the HPI.   Past Surgical History:  Procedure Laterality Date  . BACK SURGERY  1967 / 1970'S   X 2  . CHOLECYSTECTOMY  1980'S  . EYE SURGERY     cataract and lasik for glaucoma  . PACEMAKER IMPLANT N/A 10/23/2017   Procedure: PACEMAKER IMPLANT;  Surgeon: Hillis Range, MD;  [facility]: [facility];  Service: Cardiovascular;  Laterality: N/A;  . TEMPORARY PACEMAKER N/A 10/23/2017   Procedure: TEMPORARY PACEMAKER;  Surgeon: Yvonne Kendall, MD;  Location: [facility];  Service: Cardiovascular;  Laterality: N/A;  . TOTAL HIP ARTHROPLASTY Right 08/01/2013   Procedure: TOTAL RIGHT HIP ARTHROPLASTY ANTERIOR APPROACH;  Surgeon: Shelda Pal, MD;  Location: [facility];  Service: Orthopedics;  Laterality: Right;  . TOTAL HIP ARTHROPLASTY Left 10/15/2015   Procedure: LEFT TOTAL HIP ARTHROPLASTY ANTERIOR APPROACH;  Surgeon: Durene Romans, MD;  Location: [facility];  Service: Orthopedics;  Laterality: Left;  . WRIST FRACTURE SURGERY       History reviewed. No pertinent family history.   Social  History   Socioeconomic History  . Marital status: Married    Spouse name: Not on file  . Number of children: Not on file  . Years of education: Not on file  . Highest education level: Not on file  Occupational History  . Not on file  Social Needs  . Financial resource strain: Not on file  . Food insecurity:    Worry: Not on file    Inability: Not on file  . Transportation needs:    Medical: Not on file    Non-medical: Not on file  Tobacco Use  . Smoking status: Never Smoker  . Smokeless tobacco: Never Used  Substance and Sexual Activity  . Alcohol use: Not Currently    Comment: OCCASIONAL WINE  . Drug use: No  . Sexual activity: Not on file  Lifestyle  . Physical activity:    Days per week: Not on file    Minutes per session: Not on file  . Stress: Not on file  Relationships  . Social connections:    Talks on phone: Not on file    Gets together: Not on file    Attends religious service: Not on file    Active member of club or organization: Not on file    Attends meetings of clubs or organizations: Not on file    Relationship status: Not on file  . Intimate partner violence:    Fear of current or  ex partner: Not on file    Emotionally abused: Not on file    Physically abused: Not on file    Forced sexual activity: Not on file  Other Topics Concern  . Not on file  Social History Narrative  . Not on file     BP 136/80   Pulse 64   Ht 5\' 3"  (1.6 m)   Wt 131 lb 6.4 oz (59.6 kg)   SpO2 97%   BMI 23.28 kg/m   Physical Exam:  Well appearing NAD HEENT: Unremarkable Neck:  No JVD, no thyromegally Lymphatics:  No adenopathy Back:  No CVA tenderness Lungs:  Clear with no wheezes HEART:  Regular rate rhythm, no murmurs, no rubs, no clicks Abd:  soft, positive bowel sounds, no organomegally, no rebound, no guarding Ext:  2 plus pulses, no edema, no cyanosis, no clubbing Skin:  No rashes no nodules Neuro:  CN II through XII intact, motor grossly  intact  DEVICE  Normal device function.  See PaceArt for details.   Assess/Plan: 1. Heart block - she is conducting today. She feels well.  2. PPM - her medtronic DDD PM is working normally. 3. HTN - her bp is a little high today but she notes that it is normal when she is not in the doctors office.  4. Morning fatigue - etiology is unclear. Her TSH was normal 3 months ago.  Leonia Reeves.D.

## 2018-01-26 NOTE — Patient Instructions (Signed)
Medication Instructions:  Your physician recommends that you continue on your current medications as directed. Please refer to the Current Medication list given to you today.   Labwork: NONE   Testing/Procedures: NONE   Follow-Up: Your physician wants you to follow-up in: 9 Months with Dr. Taylor. You will receive a reminder letter in the mail two months in advance. If you don't receive a letter, please call our office to schedule the follow-up appointment.   Any Other Special Instructions Will Be Listed Below (If Applicable).     If you need a refill on your cardiac medications before your next appointment, please call your pharmacy. Thank you for choosing [facility]!    

## 2018-02-26 DIAGNOSIS — Z23 Encounter for immunization: Secondary | ICD-10-CM | POA: Diagnosis not present

## 2018-03-22 ENCOUNTER — Other Ambulatory Visit: Payer: Self-pay

## 2018-04-03 ENCOUNTER — Other Ambulatory Visit: Payer: Self-pay

## 2018-04-03 ENCOUNTER — Encounter (HOSPITAL_COMMUNITY): Payer: Self-pay | Admitting: *Deleted

## 2018-04-03 ENCOUNTER — Emergency Department (HOSPITAL_COMMUNITY)
Admission: EM | Admit: 2018-04-03 | Discharge: 2018-04-03 | Disposition: A | Discharge status: Home / Self Care | Payer: Medicare Other | Attending: Emergency Medicine | Admitting: Emergency Medicine

## 2018-04-03 ENCOUNTER — Emergency Department (HOSPITAL_COMMUNITY): Payer: Medicare Other

## 2018-04-03 DIAGNOSIS — R0789 Other chest pain: Secondary | ICD-10-CM

## 2018-04-03 DIAGNOSIS — R079 Chest pain, unspecified: Secondary | ICD-10-CM | POA: Diagnosis not present

## 2018-04-03 DIAGNOSIS — R0689 Other abnormalities of breathing: Secondary | ICD-10-CM | POA: Diagnosis not present

## 2018-04-03 DIAGNOSIS — R7989 Other specified abnormal findings of blood chemistry: Secondary | ICD-10-CM | POA: Diagnosis not present

## 2018-04-03 DIAGNOSIS — I10 Essential (primary) hypertension: Secondary | ICD-10-CM | POA: Diagnosis not present

## 2018-04-03 LAB — CBC WITH DIFFERENTIAL/PLATELET
Basophils Absolute: 0.1 10*3/uL (ref 0.0–0.1)
Basophils Relative: 1 %
Eosinophils Absolute: 0.1 10*3/uL (ref 0.0–0.5)
Eosinophils Relative: 1 %
HCT: 49.1 % — ABNORMAL HIGH (ref 36.0–46.0)
Hemoglobin: 15.7 g/dL — ABNORMAL HIGH (ref 12.0–15.0)
Lymphocytes Relative: 44 %
Lymphs Abs: 5.3 10*3/uL — ABNORMAL HIGH (ref 0.7–4.0)
MCH: 30.5 pg (ref 26.0–34.0)
MCHC: 32 g/dL (ref 30.0–36.0)
MCV: 95.3 fL (ref 80.0–100.0)
Monocytes Absolute: 1.5 10*3/uL — ABNORMAL HIGH (ref 0.1–1.0)
Monocytes Relative: 12 %
Neutro Abs: 5.1 10*3/uL (ref 1.7–7.7)
Neutrophils Relative %: 42 %
Platelets: 163 10*3/uL (ref 150–400)
RBC: 5.15 MIL/uL — ABNORMAL HIGH (ref 3.87–5.11)
RDW: 12 % (ref 11.5–15.5)
WBC: 12.1 10*3/uL — ABNORMAL HIGH (ref 4.0–10.5)
nRBC: 0 % (ref 0.0–0.2)

## 2018-04-03 LAB — BASIC METABOLIC PANEL
Anion gap: 8 (ref 5–15)
BUN: 16 mg/dL (ref 8–23)
CO2: 27 mmol/L (ref 22–32)
Calcium: 9.5 mg/dL (ref 8.9–10.3)
Chloride: 107 mmol/L (ref 98–111)
Creatinine, Ser: 0.86 mg/dL (ref 0.44–1.00)
GFR calc Af Amer: >60 mL/min (ref >60)
GFR calc non Af Amer: 59 mL/min — ABNORMAL LOW (ref >60)
Glucose, Bld: 104 mg/dL — ABNORMAL HIGH (ref 70–99)
Potassium: 3.8 mmol/L (ref 3.5–5.1)
Sodium: 142 mmol/L (ref 135–145)

## 2018-04-03 LAB — TROPONIN I
Troponin I: <0.03 ng/mL (ref <0.03)
Troponin I: <0.03 ng/mL (ref <0.03)

## 2018-04-03 LAB — D-DIMER, QUANTITATIVE (NOT AT ARMC): D-Dimer, Quant: 1.64 ug/mL-FEU — ABNORMAL HIGH (ref 0.00–0.50)

## 2018-04-03 MED ORDER — IOPAMIDOL (ISOVUE-370) INJECTION 76%
100.0000 mL | Freq: Once | INTRAVENOUS | Status: AC | PRN
  Administered 2018-04-03: 100 mL via INTRAVENOUS

## 2018-04-03 MED ORDER — SODIUM CHLORIDE 0.9 % IV BOLUS
500.0000 mL | Freq: Once | INTRAVENOUS | Status: AC
  Administered 2018-04-03: 500 mL via INTRAVENOUS

## 2018-04-03 NOTE — ED Provider Notes (Signed)
[facility] EMERGENCY DEPARTMENT Provider Note   CSN: [id number] Arrival date & time: 04/03/18  0152     History   Chief Complaint Chief Complaint  Patient presents with  . Chest Pain    HPI [name] [name] is a 84 y.o. female.  Patient presents to the emergency department for evaluation of chest tightness.  Patient reports that she awakened from sleep and felt a tightness in her chest.  She reports that every time she took her breath and felt like there was a band around her chest.  She thought her bra was too tight, then realize she was not wearing her bra.  She reports that she waited for some time and the symptoms continued.  She called EMS who recommended she come to the ER.  She comes by private automobile.  At arrival to the ER symptoms have resolved.  She never had any shortness of breath associated with the symptoms.     Past Medical History:  Diagnosis Date  . Arthritis   . Complication of anesthesia    pt states she had trouble waking up  . Frequency of urination   . Glaucoma   . Osteoporosis     Patient Active Problem List   Diagnosis Date Noted  . Heart block 10/23/2017  . Syncope 10/23/2017  . Nausea and vomiting 10/23/2017  . Complete heart block (HCC)   . S/P left THA, AA 10/15/2015  . Right leg weakness 08/23/2013  . Unstable balance 08/23/2013  . Expected blood loss anemia 08/02/2013  . MUSCLE WEAKNESS (GENERALIZED) 11/29/2009  . FATIGUE 11/29/2009  . GENERALIZED PAIN 11/29/2009  . OSTEOARTHRITIS, HIP, RIGHT 09/16/2009  . HIP PAIN 03/14/2009  . SPINAL STENOSIS 03/14/2009  . SCOLIOSIS, LUMBAR SPINE 03/14/2009    Past Surgical History:  Procedure Laterality Date  . BACK SURGERY  1967 / 1970'S   X 2  . CHOLECYSTECTOMY  1980'S  . EYE SURGERY     cataract and lasik for glaucoma  . PACEMAKER IMPLANT N/A 10/23/2017   Procedure: PACEMAKER IMPLANT;  Surgeon: Hillis Range, MD;  [facility]: [facility];  Service: Cardiovascular;  Laterality:  N/A;  . TEMPORARY PACEMAKER N/A 10/23/2017   Procedure: TEMPORARY PACEMAKER;  Surgeon: Yvonne Kendall, MD;  [facility]: [facility];  Service: Cardiovascular;  Laterality: N/A;  . TOTAL HIP ARTHROPLASTY Right 08/01/2013   Procedure: TOTAL RIGHT HIP ARTHROPLASTY ANTERIOR APPROACH;  Surgeon: Shelda Pal, MD;  Location: [facility];  Service: Orthopedics;  Laterality: Right;  . TOTAL HIP ARTHROPLASTY Left 10/15/2015   Procedure: LEFT TOTAL HIP ARTHROPLASTY ANTERIOR APPROACH;  Surgeon: Durene Romans, MD;  Location: [facility];  Service: Orthopedics;  Laterality: Left;  . WRIST FRACTURE SURGERY       OB History   None      Home Medications    Prior to Admission medications   Not on File    Family History No family history on file.  Social History Social History   Tobacco Use  . Smoking status: Never Smoker  . Smokeless tobacco: Never Used  Substance Use Topics  . Alcohol use: Not Currently    Comment: OCCASIONAL WINE  . Drug use: No     Allergies   Sulfonamide derivatives; Alphagan [brimonidine]; Cosopt [dorzolamide hcl-timolol mal]; Lumigan [bimatoprost]; Pilocarpine; and Timolol   Review of Systems Review of Systems  Respiratory: Positive for chest tightness.   All other systems reviewed and are negative.    Physical Exam Updated Vital Signs BP  139/75   Pulse 60   Temp 98 F (36.7 C) (Oral)   Resp 18   Ht 5\' 3"  (1.6 m)   Wt 59 kg   SpO2 99%   BMI 23.03 kg/m   Physical Exam  Constitutional: She is oriented to person, place, and time. She appears well-developed and well-nourished. No distress.  HENT:  Head: Normocephalic and atraumatic.  Right Ear: Hearing normal.  Left Ear: Hearing normal.  Nose: Nose normal.  Mouth/Throat: Oropharynx is clear and moist and mucous membranes are normal.  Eyes: Pupils are equal, round, and reactive to light. Conjunctivae and EOM are normal.  Neck: Normal range of motion. Neck supple.  Cardiovascular: Regular rhythm, S1  normal and S2 normal. Exam reveals no gallop and no friction rub.  No murmur heard. Pulmonary/Chest: Effort normal and breath sounds normal. No respiratory distress. She exhibits no tenderness.  Abdominal: Soft. Normal appearance and bowel sounds are normal. There is no hepatosplenomegaly. There is no tenderness. There is no rebound, no guarding, no tenderness at McBurney's point and negative Murphy's sign. No hernia.  Musculoskeletal: Normal range of motion.  Neurological: She is alert and oriented to person, place, and time. She has normal strength. No cranial nerve deficit or sensory deficit. Coordination normal. GCS eye subscore is 4. GCS verbal subscore is 5. GCS motor subscore is 6.  Skin: Skin is warm, dry and intact. No rash noted. No cyanosis.  Psychiatric: She has a normal mood and affect. Her speech is normal and behavior is normal. Thought content normal.  Nursing note and vitals reviewed.    ED Treatments / Results  Labs (all labs ordered are listed, but only abnormal results are displayed) Labs Reviewed  CBC WITH DIFFERENTIAL/PLATELET - Abnormal; Notable for the following components:      Result Value   WBC 12.1 (*)    RBC 5.15 (*)    Hemoglobin 15.7 (*)    HCT 49.1 (*)    Lymphs Abs 5.3 (*)    Monocytes Absolute 1.5 (*)    All other components within normal limits  BASIC METABOLIC PANEL - Abnormal; Notable for the following components:   Glucose, Bld 104 (*)    GFR calc non Af Amer 59 (*)    All other components within normal limits  D-DIMER, QUANTITATIVE (NOT AT [facility]) - Abnormal; Notable for the following components:   D-Dimer, Quant 1.64 (*)    All other components within normal limits  TROPONIN I  TROPONIN I    EKG EKG Interpretation  Date/Time:  Sunday April 03 2018 02:03:12 EST Ventricular Rate:  68 PR Interval:    QRS Duration: 85 QT Interval:  415 QTC Calculation: 442 R Axis:   73 Text Interpretation:  Sinus rhythm Normal ECG Confirmed by  Gilda Crease [phone]) on 04/03/2018 2:12:40 AM   Radiology Ct Angio Chest Pe W Or Wo Contrast  Result Date: 04/03/2018 CLINICAL DATA:  Left-sided chest pain. PE suspected, intermediate prob, positive D-dimer EXAM: CT ANGIOGRAPHY CHEST WITH CONTRAST TECHNIQUE: Multidetector CT imaging of the chest was performed using the standard protocol during bolus administration of intravenous contrast. Multiplanar CT image reconstructions and MIPs were obtained to evaluate the vascular anatomy. CONTRAST:  ISOVUE-370 IOPAMIDOL (ISOVUE-370) INJECTION 76% COMPARISON:  Chest radiograph 10/24/2017. FINDINGS: Cardiovascular: There are no filling defects within the pulmonary arteries to suggest pulmonary embolus. Elongated tortuous thoracic aorta with atherosclerosis. No evidence of acute aortic abnormality or aneurysm. Mild cardiomegaly. Left-sided pacemaker in place. No pericardial effusion.  Mediastinum/Nodes: No enlarged mediastinal or hilar lymph nodes. Small hiatal hernia, the esophagus is otherwise decompressed. No visualized thyroid nodule. Lungs/Pleura: Right lower lobe and lingular atelectasis or scarring. No confluent consolidation. No septal thickening or ground-glass opacities to suggest pulmonary edema. No pleural fluid. Trachea and mainstem bronchi are patent. Upper Abdomen: No acute findings. Musculoskeletal: Exaggerated thoracic lordosis with multilevel degenerative change in the spine. There are no acute or suspicious osseous abnormalities. Review of the MIP images confirms the above findings. IMPRESSION: 1. No pulmonary embolus or acute intrathoracic abnormality. 2. Small hiatal hernia. Aortic Atherosclerosis (ICD10-I70.0). Electronically Signed   By: Narda RutherfordMelanie  Sanford M.D.   On: 04/03/2018 04:14    Procedures Procedures (including critical care time)  Medications Ordered in ED Medications  sodium chloride 0.9 % bolus 500 mL (0 mLs Intravenous Stopped 04/03/18 0416)  iopamidol (ISOVUE-370)  76 % injection 100 mL (100 mLs Intravenous Contrast Given 04/03/18 0323)     Initial Impression / Assessment and Plan / ED Course  I have reviewed the triage vital signs and the nursing notes.  Pertinent labs & imaging results that were available during my care of the patient were reviewed by me and considered in my medical decision making (see chart for details).     Patient presents to the ER for evaluation of chest pain.  Pain is atypical in nature.  She reports that she awoke from sleep and noticed that she had tightness and pain when she took a deep breath.  At arrival to the ER, pain had resolved.  She is not tachycardic, tachypneic or hypoxic.  Vital signs are essentially normal other than initial hypertension which has self corrected.  EKG is normal.  Troponin negative x2.  Because she had a pleuritic component, d-dimer was drawn.  This was elevated.  CT angiography, however, is negative.  Patient is doing well, reassured, can follow-up with PCP as an outpatient.  No further intervention necessary.  Final Clinical Impressions(s) / ED Diagnoses   Final diagnoses:  Atypical chest pain    ED Discharge Orders    None       Wess Baney, Canary Brimhristopher J, MD 04/03/18 [phone]

## 2018-04-03 NOTE — ED Triage Notes (Signed)
Pt c/o left side chest pain with inspiration, upon arrival to er pt is pain free,

## 2018-04-03 NOTE — ED Notes (Signed)
Patient transported to CT 

## 2018-04-19 DIAGNOSIS — H401122 Primary open-angle glaucoma, left eye, moderate stage: Secondary | ICD-10-CM | POA: Diagnosis not present

## 2018-04-19 DIAGNOSIS — H401111 Primary open-angle glaucoma, right eye, mild stage: Secondary | ICD-10-CM | POA: Diagnosis not present

## 2018-04-19 DIAGNOSIS — Z961 Presence of intraocular lens: Secondary | ICD-10-CM | POA: Diagnosis not present

## 2018-04-28 ENCOUNTER — Ambulatory Visit (INDEPENDENT_AMBULATORY_CARE_PROVIDER_SITE_OTHER): Payer: Medicare Other

## 2018-04-28 DIAGNOSIS — I442 Atrioventricular block, complete: Secondary | ICD-10-CM

## 2018-04-28 DIAGNOSIS — R55 Syncope and collapse: Secondary | ICD-10-CM

## 2018-04-29 LAB — CUP PACEART REMOTE DEVICE CHECK
Battery Remaining Longevity: 122 mo
Battery Remaining Percentage: 95.5 %
Battery Voltage: 3.04 V
Brady Statistic AP VP Percent: 1 %
Brady Statistic AP VS Percent: 30 %
Brady Statistic AS VP Percent: 1 %
Brady Statistic AS VS Percent: 70 %
Brady Statistic RA Percent Paced: 29 %
Brady Statistic RV Percent Paced: 1 %
Date Time Interrogation Session: 20191227051341
Implantable Lead Implant Date: 20190622
Implantable Lead Implant Date: 20190622
Implantable Lead Location: 753859
Implantable Lead Location: 753860
Implantable Pulse Generator Implant Date: 20190622
Lead Channel Impedance Value: 410 Ohm
Lead Channel Impedance Value: 510 Ohm
Lead Channel Pacing Threshold Amplitude: 0.625 V
Lead Channel Pacing Threshold Amplitude: 0.75 V
Lead Channel Pacing Threshold Pulse Width: 0.5 ms
Lead Channel Pacing Threshold Pulse Width: 0.5 ms
Lead Channel Sensing Intrinsic Amplitude: 11.3 mV
Lead Channel Sensing Intrinsic Amplitude: 2 mV
Lead Channel Setting Pacing Amplitude: 1.625
Lead Channel Setting Pacing Amplitude: 2.5 V
Lead Channel Setting Pacing Pulse Width: 0.5 ms
Lead Channel Setting Sensing Sensitivity: 2 mV
Pulse Gen Model: 2272
Pulse Gen Serial Number: 9032820

## 2018-04-29 NOTE — Progress Notes (Signed)
Remote pacemaker transmission.   

## 2018-06-01 DIAGNOSIS — G250 Essential tremor: Secondary | ICD-10-CM | POA: Diagnosis not present

## 2018-06-01 DIAGNOSIS — R531 Weakness: Secondary | ICD-10-CM | POA: Diagnosis not present

## 2018-06-01 DIAGNOSIS — Z6823 Body mass index (BMI) 23.0-23.9, adult: Secondary | ICD-10-CM | POA: Diagnosis not present

## 2018-06-01 DIAGNOSIS — N390 Urinary tract infection, site not specified: Secondary | ICD-10-CM | POA: Diagnosis not present

## 2018-06-01 DIAGNOSIS — Z23 Encounter for immunization: Secondary | ICD-10-CM | POA: Diagnosis not present

## 2018-06-01 DIAGNOSIS — Z950 Presence of cardiac pacemaker: Secondary | ICD-10-CM | POA: Diagnosis not present

## 2018-06-01 DIAGNOSIS — W57XXXA Bitten or stung by nonvenomous insect and other nonvenomous arthropods, initial encounter: Secondary | ICD-10-CM | POA: Diagnosis not present

## 2018-06-01 DIAGNOSIS — Z112 Encounter for screening for other bacterial diseases: Secondary | ICD-10-CM | POA: Diagnosis not present

## 2018-06-01 DIAGNOSIS — Z136 Encounter for screening for cardiovascular disorders: Secondary | ICD-10-CM | POA: Diagnosis not present

## 2018-06-01 DIAGNOSIS — M79644 Pain in right finger(s): Secondary | ICD-10-CM | POA: Diagnosis not present

## 2018-06-01 DIAGNOSIS — S30860A Insect bite (nonvenomous) of lower back and pelvis, initial encounter: Secondary | ICD-10-CM | POA: Diagnosis not present

## 2018-06-30 ENCOUNTER — Emergency Department (HOSPITAL_COMMUNITY): Payer: Medicare Other

## 2018-06-30 ENCOUNTER — Emergency Department (HOSPITAL_COMMUNITY)
Admission: EM | Admit: 2018-06-30 | Discharge: 2018-06-30 | Disposition: A | Discharge status: Home / Self Care | Payer: Medicare Other | Attending: Emergency Medicine | Admitting: Emergency Medicine

## 2018-06-30 ENCOUNTER — Encounter (HOSPITAL_COMMUNITY): Payer: Self-pay | Admitting: *Deleted

## 2018-06-30 ENCOUNTER — Other Ambulatory Visit: Payer: Self-pay

## 2018-06-30 DIAGNOSIS — M79609 Pain in unspecified limb: Secondary | ICD-10-CM | POA: Insufficient documentation

## 2018-06-30 DIAGNOSIS — Z950 Presence of cardiac pacemaker: Secondary | ICD-10-CM | POA: Insufficient documentation

## 2018-06-30 DIAGNOSIS — Z96643 Presence of artificial hip joint, bilateral: Secondary | ICD-10-CM | POA: Insufficient documentation

## 2018-06-30 DIAGNOSIS — M79602 Pain in left arm: Secondary | ICD-10-CM | POA: Diagnosis not present

## 2018-06-30 DIAGNOSIS — R079 Chest pain, unspecified: Secondary | ICD-10-CM | POA: Diagnosis not present

## 2018-06-30 LAB — CBC WITH DIFFERENTIAL/PLATELET
Abs Immature Granulocytes: 0.01 10*3/uL (ref 0.00–0.07)
Basophils Absolute: 0.1 10*3/uL (ref 0.0–0.1)
Basophils Relative: 1 %
Eosinophils Absolute: 0.2 10*3/uL (ref 0.0–0.5)
Eosinophils Relative: 2 %
HCT: 47.4 % — ABNORMAL HIGH (ref 36.0–46.0)
Hemoglobin: 15.2 g/dL — ABNORMAL HIGH (ref 12.0–15.0)
Immature Granulocytes: 0 %
Lymphocytes Relative: 62 %
Lymphs Abs: 6.8 10*3/uL — ABNORMAL HIGH (ref 0.7–4.0)
MCH: 30.2 pg (ref 26.0–34.0)
MCHC: 32.1 g/dL (ref 30.0–36.0)
MCV: 94 fL (ref 80.0–100.0)
Monocytes Absolute: 0.9 10*3/uL (ref 0.1–1.0)
Monocytes Relative: 9 %
Neutro Abs: 2.8 10*3/uL (ref 1.7–7.7)
Neutrophils Relative %: 26 %
Platelets: 153 10*3/uL (ref 150–400)
RBC: 5.04 MIL/uL (ref 3.87–5.11)
RDW: 12.2 % (ref 11.5–15.5)
WBC: 10.9 10*3/uL — ABNORMAL HIGH (ref 4.0–10.5)
nRBC: 0 % (ref 0.0–0.2)

## 2018-06-30 LAB — BASIC METABOLIC PANEL
Anion gap: 8 (ref 5–15)
BUN: 18 mg/dL (ref 8–23)
CO2: 25 mmol/L (ref 22–32)
Calcium: 9.1 mg/dL (ref 8.9–10.3)
Chloride: 105 mmol/L (ref 98–111)
Creatinine, Ser: 0.76 mg/dL (ref 0.44–1.00)
GFR calc Af Amer: >60 mL/min (ref >60)
GFR calc non Af Amer: >60 mL/min (ref >60)
Glucose, Bld: 95 mg/dL (ref 70–99)
Potassium: 3.8 mmol/L (ref 3.5–5.1)
Sodium: 138 mmol/L (ref 135–145)

## 2018-06-30 LAB — TROPONIN I: Troponin I: <0.03 ng/mL (ref <0.03)

## 2018-06-30 NOTE — ED Triage Notes (Signed)
Pt c/o pains in her left arm that started earlier today; pt describes the pain as sharp and intermittent; pt denies any obvious injury; pt states she has been reading all afternoon; pt has a hx of heart attack and had a defibrillator placed last year

## 2018-06-30 NOTE — ED Provider Notes (Signed)
[facility] EMERGENCY DEPARTMENT Provider Note   CSN: [id number] Arrival date & time: 06/30/18  0016    History   Chief Complaint Chief Complaint  Patient presents with  . Arm Pain    left    HPI [name] [name] is a 85 y.o. female.     Patient presents to the emergency department for evaluation of left arm pain.  Patient reports that she has been having intermittent pains for about 1 day.  Tonight the pain has gotten more frequent.  She reports no sharp, moving pain that occurs intermittently.  She says it feels like an electric shock that starts at the shoulder and goes down the arm.  It lasts for about a minute and then resolves.  She has not identified anything that causes the pain.  The area is not tender and it does not seem to happen when she moves.  She does not have any associated chest pain, shortness of breath.  She has a history of complete heart block, status post pacemaker placement last year.  She has not felt any palpitations or racing heartbeat.     Past Medical History:  Diagnosis Date  . Arthritis   . Complication of anesthesia    pt states she had trouble waking up  . Frequency of urination   . Glaucoma   . Osteoporosis     Patient Active Problem List   Diagnosis Date Noted  . Heart block 10/23/2017  . Syncope 10/23/2017  . Nausea and vomiting 10/23/2017  . Complete heart block (HCC)   . S/P left THA, AA 10/15/2015  . Right leg weakness 08/23/2013  . Unstable balance 08/23/2013  . Expected blood loss anemia 08/02/2013  . MUSCLE WEAKNESS (GENERALIZED) 11/29/2009  . FATIGUE 11/29/2009  . GENERALIZED PAIN 11/29/2009  . OSTEOARTHRITIS, HIP, RIGHT 09/16/2009  . HIP PAIN 03/14/2009  . SPINAL STENOSIS 03/14/2009  . SCOLIOSIS, LUMBAR SPINE 03/14/2009    Past Surgical History:  Procedure Laterality Date  . BACK SURGERY  1967 / 1970'S   X 2  . CHOLECYSTECTOMY  1980'S  . EYE SURGERY     cataract and lasik for glaucoma  . PACEMAKER IMPLANT N/A  10/23/2017   Procedure: PACEMAKER IMPLANT;  Surgeon: Hillis Range, MD;  [facility]: [facility];  Service: Cardiovascular;  Laterality: N/A;  . TEMPORARY PACEMAKER N/A 10/23/2017   Procedure: TEMPORARY PACEMAKER;  Surgeon: Yvonne Kendall, MD;  Location: [facility];  Service: Cardiovascular;  Laterality: N/A;  . TOTAL HIP ARTHROPLASTY Right 08/01/2013   Procedure: TOTAL RIGHT HIP ARTHROPLASTY ANTERIOR APPROACH;  Surgeon: Shelda Pal, MD;  Location: [facility];  Service: Orthopedics;  Laterality: Right;  . TOTAL HIP ARTHROPLASTY Left 10/15/2015   Procedure: LEFT TOTAL HIP ARTHROPLASTY ANTERIOR APPROACH;  Surgeon: Durene Romans, MD;  Location: [facility];  Service: Orthopedics;  Laterality: Left;  . WRIST FRACTURE SURGERY       OB History   No obstetric history on file.      Home Medications    Prior to Admission medications   Not on File    Family History History reviewed. No pertinent family history.  Social History Social History   Tobacco Use  . Smoking status: Never Smoker  . Smokeless tobacco: Never Used  Substance Use Topics  . Alcohol use: Not Currently    Comment: OCCASIONAL WINE  . Drug use: No     Allergies   Sulfonamide derivatives; Alphagan [brimonidine]; Cosopt [dorzolamide hcl-timolol mal]; Lumigan [bimatoprost]; Pilocarpine;  and Timolol   Review of Systems Review of Systems  Musculoskeletal: Positive for arthralgias.  All other systems reviewed and are negative.    Physical Exam Updated Vital Signs BP 136/66   Pulse (!) 114   Temp (!) 97.5 F (36.4 C) (Oral)   Resp 19   Ht 5\' 4"  (1.626 m)   Wt 59 kg   SpO2 92%   BMI 22.31 kg/m   Physical Exam Vitals signs and nursing note reviewed.  Constitutional:      General: She is not in acute distress.    Appearance: Normal appearance. She is well-developed.  HENT:     Head: Normocephalic and atraumatic.     Right Ear: Hearing normal.     Left Ear: Hearing normal.     Nose: Nose normal.    Eyes:     Conjunctiva/sclera: Conjunctivae normal.     Pupils: Pupils are equal, round, and reactive to light.  Neck:     Musculoskeletal: Normal range of motion and neck supple.  Cardiovascular:     Rate and Rhythm: Regular rhythm.     Heart sounds: S1 normal and S2 normal. No murmur. No friction rub. No gallop.   Pulmonary:     Effort: Pulmonary effort is normal. No respiratory distress.     Breath sounds: Normal breath sounds.  Chest:     Chest wall: No tenderness.  Abdominal:     General: Bowel sounds are normal.     Palpations: Abdomen is soft.     Tenderness: There is no abdominal tenderness. There is no guarding or rebound. Negative signs include Murphy's sign and McBurney's sign.     Hernia: No hernia is present.  Musculoskeletal: Normal range of motion.  Skin:    General: Skin is warm and dry.     Findings: No rash.  Neurological:     Mental Status: She is alert and oriented to person, place, and time.     GCS: GCS eye subscore is 4. GCS verbal subscore is 5. GCS motor subscore is 6.     Cranial Nerves: No cranial nerve deficit.     Sensory: No sensory deficit.     Coordination: Coordination normal.  Psychiatric:        Speech: Speech normal.        Behavior: Behavior normal.        Thought Content: Thought content normal.      ED Treatments / Results  Labs (all labs ordered are listed, but only abnormal results are displayed) Labs Reviewed  CBC WITH DIFFERENTIAL/PLATELET - Abnormal; Notable for the following components:      Result Value   WBC 10.9 (*)    Hemoglobin 15.2 (*)    HCT 47.4 (*)    Lymphs Abs 6.8 (*)    All other components within normal limits  BASIC METABOLIC PANEL  TROPONIN I    EKG EKG Interpretation  Date/Time:  Thursday June 30 2018 00:33:02 EST Ventricular Rate:  65 PR Interval:    QRS Duration: 85 QT Interval:  411 QTC Calculation: 428 R Axis:   62 Text Interpretation:  Sinus rhythm Normal ECG Confirmed by Gilda Crease [phone]) on 06/30/2018 2:35:04 AM   Radiology Dg Chest 2 View  Result Date: 06/30/2018 CLINICAL DATA:  Chest pain radiating to upper extremities. EXAM: CHEST - 2 VIEW COMPARISON:  Chest radiograph October 24, 2017 FINDINGS: Cardiac silhouette is mildly enlarged. Mildly calcified aortic arch. Mild chronic interstitial changes. Strandy densities LEFT  lung base without pleural effusion or focal consolidation. No pneumothorax. Dual lead LEFT cardiac pacemaker in situ. Osteopenia. IMPRESSION: 1. Mild cardiomegaly. Chronic interstitial changes with LEFT lung base atelectasis/scarring. 2.  Aortic Atherosclerosis (ICD10-I70.0). Electronically Signed   By: Awilda Metro M.D.   On: 06/30/2018 01:37    Procedures Procedures (including critical care time)  Medications Ordered in ED Medications - No data to display   Initial Impression / Assessment and Plan / ED Course  I have reviewed the triage vital signs and the nursing notes.  Pertinent labs & imaging results that were available during my care of the patient were reviewed by me and considered in my medical decision making (see chart for details).        Patient presents to the emergency department for evaluation of left arm pain.  Symptoms have been present intermittently all day.  Symptoms are extremely atypical for cardiac etiology.  She experiences a sharp pain that shoots from her shoulder down her arm, lasts about a minute and then resolves.  This does not seem cardiac in nature, although I could not reproduce it on examination.  Likely neuropathic in origin, but cardiac evaluation performed.  EKG shows normal sinus rhythm, no signs of ischemia or infarct.  This is a normal EKG.  Her troponin is negative.  As her symptoms have been present intermittently all day, I do not feel she requires a delta troponin.  Patient reassured, likely musculoskeletal or inflammatory, use OTC pain meds, follow-up with primary doctor.  Return for any  worsening symptoms.  Final Clinical Impressions(s) / ED Diagnoses   Final diagnoses:  Left arm pain  Musculoskeletal pain of extremity    ED Discharge Orders    None       Yovani Cogburn, Canary Brim, MD 06/30/18 0236

## 2018-07-05 DIAGNOSIS — M79602 Pain in left arm: Secondary | ICD-10-CM | POA: Diagnosis not present

## 2018-07-28 ENCOUNTER — Ambulatory Visit (INDEPENDENT_AMBULATORY_CARE_PROVIDER_SITE_OTHER): Payer: Medicare Other | Admitting: *Deleted

## 2018-07-28 ENCOUNTER — Other Ambulatory Visit: Payer: Self-pay

## 2018-07-28 DIAGNOSIS — I442 Atrioventricular block, complete: Secondary | ICD-10-CM

## 2018-07-28 LAB — CUP PACEART REMOTE DEVICE CHECK
Battery Remaining Longevity: 119 mo
Battery Remaining Percentage: 95.5 %
Battery Voltage: 3.02 V
Brady Statistic AP VP Percent: 1 %
Brady Statistic AP VS Percent: 32 %
Brady Statistic AS VP Percent: 1 %
Brady Statistic AS VS Percent: 67 %
Brady Statistic RA Percent Paced: 31 %
Brady Statistic RV Percent Paced: 1 %
Date Time Interrogation Session: 20200326060014
Implantable Lead Implant Date: 20190622
Implantable Lead Implant Date: 20190622
Implantable Lead Location: 753859
Implantable Lead Location: 753860
Implantable Pulse Generator Implant Date: 20190622
Lead Channel Impedance Value: 400 Ohm
Lead Channel Impedance Value: 480 Ohm
Lead Channel Pacing Threshold Amplitude: 0.5 V
Lead Channel Pacing Threshold Amplitude: 0.75 V
Lead Channel Pacing Threshold Pulse Width: 0.5 ms
Lead Channel Pacing Threshold Pulse Width: 0.5 ms
Lead Channel Sensing Intrinsic Amplitude: 1 mV
Lead Channel Sensing Intrinsic Amplitude: 9.3 mV
Lead Channel Setting Pacing Amplitude: 1.5 V
Lead Channel Setting Pacing Amplitude: 2.5 V
Lead Channel Setting Pacing Pulse Width: 0.5 ms
Lead Channel Setting Sensing Sensitivity: 2 mV
Pulse Gen Model: 2272
Pulse Gen Serial Number: 9032820

## 2018-08-02 ENCOUNTER — Encounter: Payer: Self-pay | Admitting: Cardiology

## 2018-08-02 NOTE — Progress Notes (Signed)
Remote pacemaker transmission.   

## 2018-09-13 DIAGNOSIS — Z0000 Encounter for general adult medical examination without abnormal findings: Secondary | ICD-10-CM | POA: Diagnosis not present

## 2018-10-27 ENCOUNTER — Ambulatory Visit (INDEPENDENT_AMBULATORY_CARE_PROVIDER_SITE_OTHER): Payer: Medicare Other | Admitting: *Deleted

## 2018-10-27 DIAGNOSIS — I442 Atrioventricular block, complete: Secondary | ICD-10-CM

## 2018-10-27 LAB — CUP PACEART REMOTE DEVICE CHECK
Battery Remaining Longevity: 116 mo
Battery Remaining Percentage: 95.5 %
Battery Voltage: 3.02 V
Brady Statistic AP VP Percent: 1 %
Brady Statistic AP VS Percent: 36 %
Brady Statistic AS VP Percent: 1 %
Brady Statistic AS VS Percent: 64 %
Brady Statistic RA Percent Paced: 35 %
Brady Statistic RV Percent Paced: 1 %
Date Time Interrogation Session: 20200625060013
Implantable Lead Implant Date: 20190622
Implantable Lead Implant Date: 20190622
Implantable Lead Location: 753859
Implantable Lead Location: 753860
Implantable Pulse Generator Implant Date: 20190622
Lead Channel Impedance Value: 390 Ohm
Lead Channel Impedance Value: 510 Ohm
Lead Channel Pacing Threshold Amplitude: 0.625 V
Lead Channel Pacing Threshold Pulse Width: 0.5 ms
Lead Channel Sensing Intrinsic Amplitude: 1.2 mV
Lead Channel Sensing Intrinsic Amplitude: 9.2 mV
Lead Channel Setting Pacing Amplitude: 1.625
Lead Channel Setting Pacing Amplitude: 2.5 V
Lead Channel Setting Pacing Pulse Width: 0.5 ms
Lead Channel Setting Sensing Sensitivity: 2 mV
Pulse Gen Model: 2272
Pulse Gen Serial Number: 9032820

## 2018-11-04 ENCOUNTER — Encounter: Payer: Self-pay | Admitting: Cardiology

## 2018-11-04 NOTE — Progress Notes (Signed)
Remote pacemaker transmission.   

## 2019-01-26 ENCOUNTER — Ambulatory Visit (INDEPENDENT_AMBULATORY_CARE_PROVIDER_SITE_OTHER): Payer: Medicare Other | Admitting: *Deleted

## 2019-01-26 DIAGNOSIS — I442 Atrioventricular block, complete: Secondary | ICD-10-CM | POA: Diagnosis not present

## 2019-01-26 LAB — CUP PACEART REMOTE DEVICE CHECK
Battery Remaining Longevity: 115 mo
Battery Remaining Percentage: 95 %
Battery Voltage: 3.02 V
Brady Statistic AP VP Percent: 1 %
Brady Statistic AP VS Percent: 36 %
Brady Statistic AS VP Percent: 1 %
Brady Statistic AS VS Percent: 64 %
Brady Statistic RA Percent Paced: 35 %
Brady Statistic RV Percent Paced: 1 %
Date Time Interrogation Session: 20200924060019
Implantable Lead Implant Date: 20190622
Implantable Lead Implant Date: 20190622
Implantable Lead Location: 753859
Implantable Lead Location: 753860
Implantable Pulse Generator Implant Date: 20190622
Lead Channel Impedance Value: 390 Ohm
Lead Channel Impedance Value: 550 Ohm
Lead Channel Pacing Threshold Amplitude: 0.75 V
Lead Channel Pacing Threshold Amplitude: 0.75 V
Lead Channel Pacing Threshold Pulse Width: 0.5 ms
Lead Channel Pacing Threshold Pulse Width: 0.5 ms
Lead Channel Sensing Intrinsic Amplitude: 2.8 mV
Lead Channel Sensing Intrinsic Amplitude: 9.6 mV
Lead Channel Setting Pacing Amplitude: 1.75 V
Lead Channel Setting Pacing Amplitude: 2.5 V
Lead Channel Setting Pacing Pulse Width: 0.5 ms
Lead Channel Setting Sensing Sensitivity: 2 mV
Pulse Gen Model: 2272
Pulse Gen Serial Number: 9032820

## 2019-02-02 ENCOUNTER — Encounter: Payer: Self-pay | Admitting: Cardiology

## 2019-02-02 ENCOUNTER — Other Ambulatory Visit: Payer: Self-pay

## 2019-02-02 ENCOUNTER — Ambulatory Visit (INDEPENDENT_AMBULATORY_CARE_PROVIDER_SITE_OTHER): Payer: Medicare Other | Admitting: Internal Medicine

## 2019-02-02 ENCOUNTER — Encounter: Payer: Self-pay | Admitting: Internal Medicine

## 2019-02-02 VITALS — BP 132/80 | HR 73 | Temp 96.9°F | Ht 64.0 in | Wt 129.0 lb

## 2019-02-02 DIAGNOSIS — R55 Syncope and collapse: Secondary | ICD-10-CM

## 2019-02-02 DIAGNOSIS — I442 Atrioventricular block, complete: Secondary | ICD-10-CM | POA: Diagnosis not present

## 2019-02-02 LAB — CUP PACEART INCLINIC DEVICE CHECK
Brady Statistic RA Percent Paced: 35 %
Brady Statistic RV Percent Paced: 1 % — CL
Date Time Interrogation Session: 20201001123942
Implantable Lead Implant Date: 20190622
Implantable Lead Implant Date: 20190622
Implantable Lead Location: 753859
Implantable Lead Location: 753860
Implantable Pulse Generator Implant Date: 20190622
Lead Channel Pacing Threshold Amplitude: 0.625 V
Lead Channel Pacing Threshold Amplitude: 1 V
Lead Channel Pacing Threshold Pulse Width: 0.5 ms
Lead Channel Pacing Threshold Pulse Width: 0.5 ms
Lead Channel Sensing Intrinsic Amplitude: 11.3 mV
Lead Channel Sensing Intrinsic Amplitude: 2 mV
Lead Channel Setting Pacing Amplitude: 1.75 V
Lead Channel Setting Pacing Amplitude: 2.5 V
Lead Channel Setting Pacing Pulse Width: 0.5 ms
Lead Channel Setting Sensing Sensitivity: 2 mV
Pulse Gen Model: 2272
Pulse Gen Serial Number: 9032820

## 2019-02-02 NOTE — Progress Notes (Signed)
Remote pacemaker transmission.   

## 2019-02-02 NOTE — Patient Instructions (Signed)
Medication Instructions:  Your physician recommends that you continue on your current medications as directed. Please refer to the Current Medication list given to you today.  If you need a refill on your cardiac medications before your next appointment, please call your pharmacy.   Lab work: NONE   If you have labs (blood work) drawn today and your tests are completely normal, you will receive your results only by: . MyChart Message (if you have MyChart) OR . A paper copy in the mail If you have any lab test that is abnormal or we need to change your treatment, we will call you to review the results.  Testing/Procedures: NONE   Follow-Up: At [facility], you and your health needs are our priority.  As part of our continuing mission to provide you with exceptional heart care, we have created designated Provider Care Teams.  These Care Teams include your primary Cardiologist (physician) and Advanced Practice Providers (APPs -  Physician Assistants and Nurse Practitioners) who all work together to provide you with the care you need, when you need it. You will need a follow up appointment in 1 years.  Please call our office 2 months in advance to schedule this appointment.  You may see Gregg Taylor, MD or one of the following Advanced Practice Providers on your designated Care Team:   Amber Seiler, NP . Renee Ursuy, PA-C  Any Other Special Instructions Will Be Listed Below (If Applicable). Thank you for choosing [facility]!     

## 2019-02-02 NOTE — Progress Notes (Signed)
HPI [name] [name] returns today for followup. She developed symptomatic bradycardia due to CHB and underwent PPM insertion 15 months ago. She has done well in the interim. She still tends a garden.  No chest pain or sob. No syncope. Allergies  Allergen Reactions  . Sulfonamide Derivatives Itching and Swelling  . Alphagan [Brimonidine] Itching and Rash  . Cosopt [Dorzolamide Hcl-Timolol Mal] Itching, Swelling and Rash  . Lumigan [Bimatoprost] Itching and Rash  . Pilocarpine Itching and Rash  . Timolol Itching and Rash     No current outpatient medications on file.   No current facility-administered medications for this visit.      Past Medical History:  Diagnosis Date  . Arthritis   . Complication of anesthesia    pt states she had trouble waking up  . Frequency of urination   . Glaucoma   . Osteoporosis     ROS:   All systems reviewed and negative except as noted in the HPI.   Past Surgical History:  Procedure Laterality Date  . [place] / 1970'S   X 2  . CHOLECYSTECTOMY  1980'S  . EYE SURGERY     cataract and lasik for glaucoma  . PACEMAKER IMPLANT N/A 10/23/2017   Procedure: PACEMAKER IMPLANT;  Surgeon: Thompson Grayer, MD;  Location: [facility];  Service: Cardiovascular;  Laterality: N/A;  . TEMPORARY PACEMAKER N/A 10/23/2017   Procedure: TEMPORARY PACEMAKER;  Surgeon: Nelva Bush, MD;  Location: [facility];  Service: Cardiovascular;  Laterality: N/A;  . TOTAL HIP ARTHROPLASTY Right 08/01/2013   Procedure: TOTAL RIGHT HIP ARTHROPLASTY ANTERIOR APPROACH;  Surgeon: Mauri Pole, MD;  Location: [facility];  Service: Orthopedics;  Laterality: Right;  . TOTAL HIP ARTHROPLASTY Left 10/15/2015   Procedure: LEFT TOTAL HIP ARTHROPLASTY ANTERIOR APPROACH;  Surgeon: Paralee Cancel, MD;  Location: [facility];  Service: Orthopedics;  Laterality: Left;  . WRIST FRACTURE SURGERY       No family history on file.   Social History   Socioeconomic  History  . Marital status: Married    Spouse name: Not on file  . Number of children: Not on file  . Years of education: Not on file  . Highest education level: Not on file  Occupational History  . Not on file  Social Needs  . Financial resource strain: Not on file  . Food insecurity    Worry: Not on file    Inability: Not on file  . Transportation needs    Medical: Not on file    Non-medical: Not on file  Tobacco Use  . Smoking status: Never Smoker  . Smokeless tobacco: Never Used  Substance and Sexual Activity  . Alcohol use: Not Currently    Comment: OCCASIONAL WINE  . Drug use: No  . Sexual activity: Not on file  Lifestyle  . Physical activity    Days per week: Not on file    Minutes per session: Not on file  . Stress: Not on file  Relationships  . Social Herbalist on phone: Not on file    Gets together: Not on file    Attends religious service: Not on file    Active member of club or organization: Not on file    Attends meetings of clubs or organizations: Not on file    Relationship status: Not on file  . Intimate partner violence    Fear of current or ex partner: Not  on file    Emotionally abused: Not on file    Physically abused: Not on file    Forced sexual activity: Not on file  Other Topics Concern  . Not on file  Social History Narrative  . Not on file     BP 132/80   Pulse 73   Temp (!) 96.9 F (36.1 C) (Temporal)   Ht 5\' 4"  (1.626 m)   Wt 129 lb (58.5 kg)   SpO2 98%   BMI 22.14 kg/m   Physical Exam:  Well appearing elderly woman, looking younger than her stated age, NAD HEENT: Unremarkable Neck:  No JVD, no thyromegally Lymphatics:  No adenopathy Back:  No CVA tenderness Lungs:  Clear with no wheezes HEART:  Regular rate rhythm, no murmurs, no rubs, no clicks Abd:  soft, positive bowel sounds, no organomegally, no rebound, no guarding Ext:  2 plus pulses, no edema, no cyanosis, no clubbing Skin:  No rashes no nodules  Neuro:  CN II through XII intact, motor grossly intact  DEVICE  Normal device function.  See PaceArt for details.   Assess/Plan: 1. CHB - she is asymptomatic, s/p PPM insertion. 2. PPM - her St. Jude DDD PM is working normally.  3. Chest pain - this has resolved. She will undergo watchful waiting.  .D.

## 2019-02-03 DIAGNOSIS — Z23 Encounter for immunization: Secondary | ICD-10-CM | POA: Diagnosis not present

## 2019-04-27 ENCOUNTER — Ambulatory Visit (INDEPENDENT_AMBULATORY_CARE_PROVIDER_SITE_OTHER): Payer: Medicare Other | Admitting: *Deleted

## 2019-04-27 DIAGNOSIS — I442 Atrioventricular block, complete: Secondary | ICD-10-CM

## 2019-05-01 LAB — CUP PACEART REMOTE DEVICE CHECK
Battery Remaining Longevity: 123 mo
Battery Remaining Percentage: 95.5 %
Battery Voltage: 3.02 V
Brady Statistic AP VP Percent: 1 %
Brady Statistic AP VS Percent: 36 %
Brady Statistic AS VP Percent: 1 %
Brady Statistic AS VS Percent: 64 %
Brady Statistic RA Percent Paced: 35 %
Brady Statistic RV Percent Paced: 1 %
Date Time Interrogation Session: 20201227225756
Implantable Lead Implant Date: 20190622
Implantable Lead Implant Date: 20190622
Implantable Lead Location: 753859
Implantable Lead Location: 753860
Implantable Pulse Generator Implant Date: 20190622
Lead Channel Impedance Value: 380 Ohm
Lead Channel Impedance Value: 550 Ohm
Lead Channel Pacing Threshold Amplitude: 0.625 V
Lead Channel Pacing Threshold Amplitude: 1 V
Lead Channel Pacing Threshold Pulse Width: 0.5 ms
Lead Channel Pacing Threshold Pulse Width: 0.5 ms
Lead Channel Sensing Intrinsic Amplitude: 2.6 mV
Lead Channel Sensing Intrinsic Amplitude: 9.1 mV
Lead Channel Setting Pacing Amplitude: 1.625
Lead Channel Setting Pacing Amplitude: 2.5 V
Lead Channel Setting Pacing Pulse Width: 0.5 ms
Lead Channel Setting Sensing Sensitivity: 2 mV
Pulse Gen Model: 2272
Pulse Gen Serial Number: 9032820

## 2019-05-27 DIAGNOSIS — Z23 Encounter for immunization: Secondary | ICD-10-CM | POA: Diagnosis not present

## 2019-06-20 DIAGNOSIS — Z961 Presence of intraocular lens: Secondary | ICD-10-CM | POA: Diagnosis not present

## 2019-06-20 DIAGNOSIS — H401111 Primary open-angle glaucoma, right eye, mild stage: Secondary | ICD-10-CM | POA: Diagnosis not present

## 2019-06-20 DIAGNOSIS — H401122 Primary open-angle glaucoma, left eye, moderate stage: Secondary | ICD-10-CM | POA: Diagnosis not present

## 2019-06-23 ENCOUNTER — Telehealth: Payer: Self-pay | Admitting: Internal Medicine

## 2019-06-23 NOTE — Telephone Encounter (Signed)
Please give pt a call-- states she's been having weakness and her hands and feet can't seem to get warm, thinks it maybe coming from her device not working right.
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
## 2019-06-26 ENCOUNTER — Telehealth: Payer: Self-pay

## 2019-06-26 DIAGNOSIS — Z23 Encounter for immunization: Secondary | ICD-10-CM | POA: Diagnosis not present

## 2019-06-26 NOTE — Telephone Encounter (Signed)
Spoke with pt daughter, provided instructions on sending manual transmission from PM.  She will send that through as soon as she can get pt over to bedside monitor.
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
## 2019-06-27 NOTE — Telephone Encounter (Signed)
Transmission reviewed. Normal device function.  Left message for daughter to call back.   Gypsy Balsam, NP 06/27/2019 8:46 AM

## 2019-07-15 ENCOUNTER — Emergency Department (HOSPITAL_COMMUNITY)
Admission: EM | Admit: 2019-07-15 | Discharge: 2019-07-15 | Disposition: A | Discharge status: Home / Self Care | Payer: Medicare Other | Attending: Emergency Medicine | Admitting: Emergency Medicine

## 2019-07-15 ENCOUNTER — Encounter (HOSPITAL_COMMUNITY): Payer: Self-pay | Admitting: Emergency Medicine

## 2019-07-15 ENCOUNTER — Other Ambulatory Visit: Payer: Self-pay

## 2019-07-15 DIAGNOSIS — Y9389 Activity, other specified: Secondary | ICD-10-CM | POA: Insufficient documentation

## 2019-07-15 DIAGNOSIS — S80861A Insect bite (nonvenomous), right lower leg, initial encounter: Secondary | ICD-10-CM | POA: Diagnosis not present

## 2019-07-15 DIAGNOSIS — Z96641 Presence of right artificial hip joint: Secondary | ICD-10-CM | POA: Diagnosis not present

## 2019-07-15 DIAGNOSIS — Z96642 Presence of left artificial hip joint: Secondary | ICD-10-CM | POA: Diagnosis not present

## 2019-07-15 DIAGNOSIS — W57XXXA Bitten or stung by nonvenomous insect and other nonvenomous arthropods, initial encounter: Secondary | ICD-10-CM | POA: Diagnosis not present

## 2019-07-15 DIAGNOSIS — Y929 Unspecified place or not applicable: Secondary | ICD-10-CM | POA: Diagnosis not present

## 2019-07-15 DIAGNOSIS — S80862A Insect bite (nonvenomous), left lower leg, initial encounter: Secondary | ICD-10-CM | POA: Diagnosis not present

## 2019-07-15 DIAGNOSIS — Y998 Other external cause status: Secondary | ICD-10-CM | POA: Diagnosis not present

## 2019-07-15 DIAGNOSIS — S80262A Insect bite (nonvenomous), left knee, initial encounter: Secondary | ICD-10-CM | POA: Insufficient documentation

## 2019-07-15 DIAGNOSIS — Z950 Presence of cardiac pacemaker: Secondary | ICD-10-CM | POA: Insufficient documentation

## 2019-07-15 MED ORDER — DOXYCYCLINE HYCLATE 100 MG PO TABS
100.0000 mg | ORAL_TABLET | Freq: Once | ORAL | Status: AC
  Administered 2019-07-15: 09:00:00 100 mg via ORAL
  Filled 2019-07-15: qty 1

## 2019-07-15 MED ORDER — DOXYCYCLINE HYCLATE 100 MG PO CAPS: 100.0000 mg | ORAL_CAPSULE | Freq: Two times a day (BID) | ORAL | 0 refills | Status: DC

## 2019-07-15 NOTE — Discharge Instructions (Signed)
Take the entire course of antibiotics.

## 2019-07-15 NOTE — ED Triage Notes (Signed)
Pt reports removing 2 ticks and wants a prescription so she does not get lyme disease. Denies symptoms.

## 2019-07-15 NOTE — ED Provider Notes (Signed)
[name]   CSN: [id number] Arrival date & time: 07/15/19  0825     History Chief Complaint  Patient presents with  . Tick Removal    [name] [name] is a 86 y.o. female presenting for evaluation after being bit by 2 ticks which she has removed, one from her right anterior leg yesterday, the other this morning behind her left knee.  She denies fevers, chills, headache, myalgia,  n/v or rash. She reports she had Lyme Disease about 10 years ago and was very sick with it for about 2 years.  She is requiesting prophylactic antibiotics to ensure she does not get a tick borne illness.   The history is provided by the patient.       Past Medical History:  Diagnosis Date  . Arthritis   . Complication of anesthesia    pt states she had trouble waking up  . Frequency of urination   . Glaucoma   . Osteoporosis     Patient Active Problem List   Diagnosis Date Noted  . Heart block 10/23/2017  . Syncope 10/23/2017  . Nausea and vomiting 10/23/2017  . Complete heart block ([place])   . S/P left THA, AA 10/15/2015  . Right leg weakness 08/23/2013  . Unstable balance 08/23/2013  . Expected blood loss anemia 08/02/2013  . MUSCLE WEAKNESS (GENERALIZED) 11/29/2009  . FATIGUE 11/29/2009  . GENERALIZED PAIN 11/29/2009  . OSTEOARTHRITIS, HIP, RIGHT 09/16/2009  . HIP PAIN 03/14/2009  . SPINAL STENOSIS 03/14/2009  . SCOLIOSIS, LUMBAR SPINE 03/14/2009    Past Surgical History:  Procedure Laterality Date  . [place] / 1970'S   X 2  . CHOLECYSTECTOMY  1980'S  . EYE SURGERY     cataract and lasik for glaucoma  . PACEMAKER IMPLANT N/A 10/23/2017   Procedure: PACEMAKER IMPLANT;  Surgeon: Thompson Grayer, MD;  Location: [facility];  Service: Cardiovascular;  Laterality: N/A;  . TEMPORARY PACEMAKER N/A 10/23/2017   Procedure: TEMPORARY PACEMAKER;  Surgeon: Nelva Bush, MD;  Location: [facility];  Service: Cardiovascular;   Laterality: N/A;  . TOTAL HIP ARTHROPLASTY Right 08/01/2013   Procedure: TOTAL RIGHT HIP ARTHROPLASTY ANTERIOR APPROACH;  Surgeon: Mauri Pole, MD;  [facility];  Service: Orthopedics;  Laterality: Right;  . TOTAL HIP ARTHROPLASTY Left 10/15/2015   Procedure: LEFT TOTAL HIP ARTHROPLASTY ANTERIOR APPROACH;  Surgeon: Paralee Cancel, MD;  [facility];  Service: Orthopedics;  Laterality: Left;  . WRIST FRACTURE SURGERY       OB History   No obstetric history on file.     History reviewed. No pertinent family history.  Social History   Tobacco Use  . Smoking status: Never Smoker  . Smokeless tobacco: Never Used  Substance Use Topics  . Alcohol use: Not Currently    Comment: OCCASIONAL WINE  . Drug use: No    Home Medications Prior to Admission medications   Medication Sig Start Date End Date Taking? Authorizing Provider  doxycycline (VIBRAMYCIN) 100 MG capsule Take 1 capsule (100 mg total) by mouth 2 (two) times daily. 07/15/19   Evalee Jefferson, PA-C    Allergies    Sulfonamide derivatives, Alphagan [brimonidine], Cosopt [dorzolamide hcl-timolol mal], Lumigan [bimatoprost], Pilocarpine, and Timolol  Review of Systems   Review of Systems  Constitutional: Negative for fever.  HENT: Negative for congestion and sore throat.   Eyes: Negative.   Respiratory: Negative for chest tightness and shortness of breath.  Cardiovascular: Negative for chest pain.  Gastrointestinal: Negative for abdominal pain and nausea.  Genitourinary: Negative.   Musculoskeletal: Negative for arthralgias, joint swelling and neck pain.  Skin: Positive for wound. Negative for rash.  Neurological: Negative for dizziness, weakness, light-headedness, numbness and headaches.  Psychiatric/Behavioral: Negative.     Physical Exam Updated Vital Signs BP (!) 165/91 (BP Location: Right Arm)   Pulse 94   Temp 97.6 F (36.4 C) (Oral)   Resp 16   Ht 5\' 3"  (1.6 m)   Wt 58.5 kg   SpO2 100%   BMI 22.85  kg/m   Physical Exam Constitutional:      General: She is not in acute distress.    Appearance: She is well-developed.  HENT:     Head: Normocephalic and atraumatic.  Cardiovascular:     Rate and Rhythm: Normal rate.  Pulmonary:     Effort: Pulmonary effort is normal.     Breath sounds: No wheezing.  Musculoskeletal:        General: Normal range of motion.     Cervical back: Neck supple.  Skin:    Findings: Erythema present. No rash.     Comments: Small bite site noted right anterior tibia.  Similar site left posterior popliteal space but with a 2 cm surrounding area of erythema. No bullseye pattern. No rash.  Neurological:     Mental Status: She is alert.     ED Results / Procedures / Treatments   Labs (all labs ordered are listed, but only abnormal results are displayed) Labs Reviewed - No data to display  EKG None  Radiology No results found.  Procedures Procedures (including critical care time)  Medications Ordered in ED Medications  doxycycline (VIBRA-TABS) tablet 100 mg (has no administration in time range)    ED Course  I have reviewed the triage vital signs and the nursing notes.  Pertinent labs & imaging results that were available during my care of the patient were reviewed by me and considered in my medical decision making (see chart for details).    MDM Rules/Calculators/A&P                      Pt with [place] tick bites with the left posterior knee site with increased erythema, [place] in course, may be localized reaction, but will cover with doxycycline.   Prn f/u pcp as needed.  Final Clinical Impression(s) / ED Diagnoses Final diagnoses:  Tick bite, initial encounter    Rx / DC Orders ED Discharge Orders         Ordered    doxycycline (VIBRAMYCIN) 100 MG capsule  2 times daily     07/15/19 0844           07/17/19, PA-C 07/15/19 07/17/19    [id number], MD 07/15/19 [phone]

## 2019-07-27 ENCOUNTER — Ambulatory Visit (INDEPENDENT_AMBULATORY_CARE_PROVIDER_SITE_OTHER): Payer: Medicare Other | Admitting: *Deleted

## 2019-07-27 DIAGNOSIS — I442 Atrioventricular block, complete: Secondary | ICD-10-CM

## 2019-07-27 LAB — CUP PACEART REMOTE DEVICE CHECK
Battery Remaining Longevity: 124 mo
Battery Remaining Percentage: 95.5 %
Battery Voltage: 3.02 V
Brady Statistic AP VP Percent: 1 %
Brady Statistic AP VS Percent: 35 %
Brady Statistic AS VP Percent: 1 %
Brady Statistic AS VS Percent: 64 %
Brady Statistic RA Percent Paced: 34 %
Brady Statistic RV Percent Paced: 1 %
Date Time Interrogation Session: 20210325023520
Implantable Lead Implant Date: 20190622
Implantable Lead Implant Date: 20190622
Implantable Lead Location: 753859
Implantable Lead Location: 753860
Implantable Pulse Generator Implant Date: 20190622
Lead Channel Impedance Value: 390 Ohm
Lead Channel Impedance Value: 560 Ohm
Lead Channel Pacing Threshold Amplitude: 0.625 V
Lead Channel Pacing Threshold Amplitude: 1 V
Lead Channel Pacing Threshold Pulse Width: 0.5 ms
Lead Channel Pacing Threshold Pulse Width: 0.5 ms
Lead Channel Sensing Intrinsic Amplitude: 3.4 mV
Lead Channel Sensing Intrinsic Amplitude: 9 mV
Lead Channel Setting Pacing Amplitude: 1.625
Lead Channel Setting Pacing Amplitude: 2.5 V
Lead Channel Setting Pacing Pulse Width: 0.5 ms
Lead Channel Setting Sensing Sensitivity: 2 mV
Pulse Gen Model: 2272
Pulse Gen Serial Number: 9032820

## 2019-07-27 NOTE — Progress Notes (Signed)
PPM Remote  

## 2019-08-08 DIAGNOSIS — H401122 Primary open-angle glaucoma, left eye, moderate stage: Secondary | ICD-10-CM | POA: Diagnosis not present

## 2019-08-09 DIAGNOSIS — M79644 Pain in right finger(s): Secondary | ICD-10-CM | POA: Diagnosis not present

## 2019-08-09 DIAGNOSIS — W57XXXA Bitten or stung by nonvenomous insect and other nonvenomous arthropods, initial encounter: Secondary | ICD-10-CM | POA: Diagnosis not present

## 2019-08-09 DIAGNOSIS — N390 Urinary tract infection, site not specified: Secondary | ICD-10-CM | POA: Diagnosis not present

## 2019-08-09 DIAGNOSIS — S80262D Insect bite (nonvenomous), left knee, subsequent encounter: Secondary | ICD-10-CM | POA: Diagnosis not present

## 2019-08-09 DIAGNOSIS — Z6823 Body mass index (BMI) 23.0-23.9, adult: Secondary | ICD-10-CM | POA: Diagnosis not present

## 2019-08-09 DIAGNOSIS — S30860A Insect bite (nonvenomous) of lower back and pelvis, initial encounter: Secondary | ICD-10-CM | POA: Diagnosis not present

## 2019-08-09 DIAGNOSIS — Z950 Presence of cardiac pacemaker: Secondary | ICD-10-CM | POA: Diagnosis not present

## 2019-08-09 DIAGNOSIS — L299 Pruritus, unspecified: Secondary | ICD-10-CM | POA: Diagnosis not present

## 2019-08-09 DIAGNOSIS — Z23 Encounter for immunization: Secondary | ICD-10-CM | POA: Diagnosis not present

## 2019-10-13 DIAGNOSIS — H401111 Primary open-angle glaucoma, right eye, mild stage: Secondary | ICD-10-CM | POA: Diagnosis not present

## 2019-10-13 DIAGNOSIS — H401122 Primary open-angle glaucoma, left eye, moderate stage: Secondary | ICD-10-CM | POA: Diagnosis not present

## 2019-10-18 ENCOUNTER — Other Ambulatory Visit: Payer: Self-pay

## 2019-10-18 ENCOUNTER — Encounter (HOSPITAL_COMMUNITY): Payer: Self-pay | Admitting: Emergency Medicine

## 2019-10-18 ENCOUNTER — Emergency Department (HOSPITAL_COMMUNITY)
Admission: EM | Admit: 2019-10-18 | Discharge: 2019-10-18 | Disposition: A | Discharge status: Home / Self Care | Payer: Medicare Other | Attending: Emergency Medicine | Admitting: Emergency Medicine

## 2019-10-18 DIAGNOSIS — Z882 Allergy status to sulfonamides status: Secondary | ICD-10-CM | POA: Diagnosis not present

## 2019-10-18 DIAGNOSIS — Y9389 Activity, other specified: Secondary | ICD-10-CM | POA: Insufficient documentation

## 2019-10-18 DIAGNOSIS — Y9289 Other specified places as the place of occurrence of the external cause: Secondary | ICD-10-CM | POA: Diagnosis not present

## 2019-10-18 DIAGNOSIS — W57XXXA Bitten or stung by nonvenomous insect and other nonvenomous arthropods, initial encounter: Secondary | ICD-10-CM | POA: Diagnosis not present

## 2019-10-18 DIAGNOSIS — Y998 Other external cause status: Secondary | ICD-10-CM | POA: Insufficient documentation

## 2019-10-18 DIAGNOSIS — Z888 Allergy status to other drugs, medicaments and biological substances status: Secondary | ICD-10-CM | POA: Diagnosis not present

## 2019-10-18 DIAGNOSIS — S30861A Insect bite (nonvenomous) of abdominal wall, initial encounter: Secondary | ICD-10-CM | POA: Diagnosis not present

## 2019-10-18 NOTE — ED Triage Notes (Signed)
Pt states she just removed a tick from upper abd and wants the area checked.

## 2019-10-18 NOTE — ED Provider Notes (Signed)
[facility] EMERGENCY DEPARTMENT Provider Note   CSN: [id number] Arrival date & time: 10/18/19  1945     History Chief Complaint  Patient presents with  . Insect Bite    [name] [name] is a 86 y.o. female.  Pt presents to the ED today with a tick bite to her abdomen.  Pt noticed it about 1/2 hr pta.  She was able to remove it.  She is not sure how long it'd been there.  She came in today because she's had Lyme disease in the past and does not want it again.  The pt wants the medicine for Lyme.         Past Medical History:  Diagnosis Date  . Arthritis   . Complication of anesthesia    pt states she had trouble waking up  . Frequency of urination   . Glaucoma   . Osteoporosis     Patient Active Problem List   Diagnosis Date Noted  . Heart block 10/23/2017  . Syncope 10/23/2017  . Nausea and vomiting 10/23/2017  . Complete heart block ([facility])   . S/P left THA, AA 10/15/2015  . Right leg weakness 08/23/2013  . Unstable balance 08/23/2013  . Expected blood loss anemia 08/02/2013  . MUSCLE WEAKNESS (GENERALIZED) 11/29/2009  . FATIGUE 11/29/2009  . GENERALIZED PAIN 11/29/2009  . OSTEOARTHRITIS, HIP, RIGHT 09/16/2009  . HIP PAIN 03/14/2009  . SPINAL STENOSIS 03/14/2009  . SCOLIOSIS, LUMBAR SPINE 03/14/2009    Past Surgical History:  Procedure Laterality Date  . BACK SURGERY  1967 / 1970'S   X 2  . CHOLECYSTECTOMY  1980'S  . EYE SURGERY     cataract and lasik for glaucoma  . PACEMAKER IMPLANT N/A 10/23/2017   Procedure: PACEMAKER IMPLANT;  Surgeon: Hillis Range, MD;  [facility];  Service: Cardiovascular;  Laterality: N/A;  . TEMPORARY PACEMAKER N/A 10/23/2017   Procedure: TEMPORARY PACEMAKER;  Surgeon: Yvonne Kendall, MD;  [facility]: [facility];  Service: Cardiovascular;  Laterality: N/A;  . TOTAL HIP ARTHROPLASTY Right 08/01/2013   Procedure: TOTAL RIGHT HIP ARTHROPLASTY ANTERIOR APPROACH;  Surgeon: Shelda Pal, MD;  [facility];  Service: Orthopedics;  Laterality: Right;  . TOTAL HIP ARTHROPLASTY Left 10/15/2015   Procedure: LEFT TOTAL HIP ARTHROPLASTY ANTERIOR APPROACH;  Surgeon: Durene Romans, MD;  Location: [facility];  Service: Orthopedics;  Laterality: Left;  . WRIST FRACTURE SURGERY       OB History   No obstetric history on file.     No family history on file.  Social History   Tobacco Use  . Smoking status: Never Smoker  . Smokeless tobacco: Never Used  Vaping Use  . Vaping Use: Never used  Substance Use Topics  . Alcohol use: Not Currently    Comment: OCCASIONAL WINE  . Drug use: No    Home Medications Prior to Admission medications   Medication Sig Start Date End Date Taking? Authorizing Provider  doxycycline (VIBRAMYCIN) 100 MG capsule Take 1 capsule (100 mg total) by mouth 2 (two) times daily. 07/15/19   Burgess Amor, PA-C    Allergies    Sulfonamide derivatives, Alphagan [brimonidine], Cosopt [dorzolamide hcl-timolol mal], Lumigan [bimatoprost], Pilocarpine, and Timolol  Review of Systems   Review of Systems  Skin:       Insect bite  All other systems reviewed and are negative.   Physical Exam Updated Vital Signs BP 140/86 (BP Location: Right Arm)   Pulse 78  Temp 98.6 F (37 C) (Oral)   Resp 16   Ht 5\' 4"  (1.626 m)   Wt 59 kg   SpO2 96%   BMI 22.31 kg/m   Physical Exam Vitals and nursing note reviewed.  Constitutional:      Appearance: Normal appearance.  HENT:     Head: Normocephalic and atraumatic.     Right Ear: External ear normal.     Left Ear: External ear normal.     Nose: Nose normal.     Mouth/Throat:     Mouth: Mucous membranes are moist.     Pharynx: Oropharynx is clear.  Eyes:     Extraocular Movements: Extraocular movements intact.     Conjunctiva/sclera: Conjunctivae normal.     Pupils: Pupils are equal, round, and reactive to light.  Cardiovascular:     Rate and Rhythm: Normal rate and regular rhythm.     Pulses: Normal pulses.     Heart  sounds: Normal heart sounds.  Pulmonary:     Effort: Pulmonary effort is normal.  Abdominal:     General: Abdomen is flat. Bowel sounds are normal.     Palpations: Abdomen is soft.  Musculoskeletal:        General: Normal range of motion.     Cervical back: Normal range of motion and neck supple.  Skin:    General: Skin is warm.     Capillary Refill: Capillary refill takes less than 2 seconds.     Comments: Small nickel sized red area of tick bite.   Neurological:     General: No focal deficit present.     Mental Status: She is alert and oriented to person, place, and time.  Psychiatric:        Mood and Affect: Mood normal.        Behavior: Behavior normal.     ED Results / Procedures / Treatments   Labs (all labs ordered are listed, but only abnormal results are displayed) Labs Reviewed - No data to display  EKG None  Radiology No results found.  Procedures Procedures (including critical care time)  Medications Ordered in ED Medications - No data to display  ED Course  I have reviewed the triage vital signs and the nursing notes.  Pertinent labs & imaging results that were available during my care of the patient were reviewed by me and considered in my medical decision making (see chart for details).    MDM Rules/Calculators/A&P                          Pt has no sx of Lyme or RMSF.  It's too [place] to get Abs.  Pt reassured that she does not need Doxycycline.  Return if worse.  Final Clinical Impression(s) / ED Diagnoses Final diagnoses:  Tick bite, initial encounter    Rx / DC Orders ED Discharge Orders    None       Isla Pence, MD 10/18/19 2028

## 2019-10-26 ENCOUNTER — Ambulatory Visit (INDEPENDENT_AMBULATORY_CARE_PROVIDER_SITE_OTHER): Payer: Medicare Other | Admitting: *Deleted

## 2019-10-26 DIAGNOSIS — I459 Conduction disorder, unspecified: Secondary | ICD-10-CM | POA: Diagnosis not present

## 2019-10-26 LAB — CUP PACEART REMOTE DEVICE CHECK
Battery Remaining Longevity: 125 mo
Battery Remaining Percentage: 95.5 %
Battery Voltage: 3.02 V
Brady Statistic AP VP Percent: 1 %
Brady Statistic AP VS Percent: 35 %
Brady Statistic AS VP Percent: 1 %
Brady Statistic AS VS Percent: 65 %
Brady Statistic RA Percent Paced: 34 %
Brady Statistic RV Percent Paced: 1 %
Date Time Interrogation Session: 20210624031938
Implantable Lead Implant Date: 20190622
Implantable Lead Implant Date: 20190622
Implantable Lead Location: 753859
Implantable Lead Location: 753860
Implantable Pulse Generator Implant Date: 20190622
Lead Channel Impedance Value: 400 Ohm
Lead Channel Impedance Value: 550 Ohm
Lead Channel Pacing Threshold Amplitude: 0.75 V
Lead Channel Pacing Threshold Amplitude: 1 V
Lead Channel Pacing Threshold Pulse Width: 0.5 ms
Lead Channel Pacing Threshold Pulse Width: 0.5 ms
Lead Channel Sensing Intrinsic Amplitude: 2.8 mV
Lead Channel Sensing Intrinsic Amplitude: 7.8 mV
Lead Channel Setting Pacing Amplitude: 1.75 V
Lead Channel Setting Pacing Amplitude: 2.5 V
Lead Channel Setting Pacing Pulse Width: 0.5 ms
Lead Channel Setting Sensing Sensitivity: 2 mV
Pulse Gen Model: 2272
Pulse Gen Serial Number: 9032820

## 2019-10-26 NOTE — Progress Notes (Signed)
Remote pacemaker transmission.   

## 2020-01-25 ENCOUNTER — Ambulatory Visit (INDEPENDENT_AMBULATORY_CARE_PROVIDER_SITE_OTHER): Payer: Medicare Other | Admitting: Emergency Medicine

## 2020-01-25 DIAGNOSIS — I442 Atrioventricular block, complete: Secondary | ICD-10-CM | POA: Diagnosis not present

## 2020-01-25 LAB — CUP PACEART REMOTE DEVICE CHECK
Battery Remaining Longevity: 123 mo
Battery Remaining Percentage: 95.5 %
Battery Voltage: 3.02 V
Brady Statistic AP VP Percent: 1 %
Brady Statistic AP VS Percent: 35 %
Brady Statistic AS VP Percent: 1 %
Brady Statistic AS VS Percent: 65 %
Brady Statistic RA Percent Paced: 34 %
Brady Statistic RV Percent Paced: 1 %
Date Time Interrogation Session: 20210923020012
Implantable Lead Implant Date: 20190622
Implantable Lead Implant Date: 20190622
Implantable Lead Location: 753859
Implantable Lead Location: 753860
Implantable Pulse Generator Implant Date: 20190622
Lead Channel Impedance Value: 390 Ohm
Lead Channel Impedance Value: 460 Ohm
Lead Channel Pacing Threshold Amplitude: 0.625 V
Lead Channel Pacing Threshold Amplitude: 1 V
Lead Channel Pacing Threshold Pulse Width: 0.5 ms
Lead Channel Pacing Threshold Pulse Width: 0.5 ms
Lead Channel Sensing Intrinsic Amplitude: 2.8 mV
Lead Channel Sensing Intrinsic Amplitude: 8.4 mV
Lead Channel Setting Pacing Amplitude: 1.625
Lead Channel Setting Pacing Amplitude: 2.5 V
Lead Channel Setting Pacing Pulse Width: 0.5 ms
Lead Channel Setting Sensing Sensitivity: 2 mV
Pulse Gen Model: 2272
Pulse Gen Serial Number: 9032820

## 2020-01-30 NOTE — Progress Notes (Signed)
Remote pacemaker transmission.   

## 2020-02-16 DIAGNOSIS — H401111 Primary open-angle glaucoma, right eye, mild stage: Secondary | ICD-10-CM | POA: Diagnosis not present

## 2020-02-16 DIAGNOSIS — H401122 Primary open-angle glaucoma, left eye, moderate stage: Secondary | ICD-10-CM | POA: Diagnosis not present

## 2020-02-27 DIAGNOSIS — Z23 Encounter for immunization: Secondary | ICD-10-CM | POA: Diagnosis not present

## 2020-03-10 DIAGNOSIS — R0689 Other abnormalities of breathing: Secondary | ICD-10-CM | POA: Diagnosis not present

## 2020-03-10 DIAGNOSIS — I10 Essential (primary) hypertension: Secondary | ICD-10-CM | POA: Diagnosis not present

## 2020-03-11 DIAGNOSIS — R002 Palpitations: Secondary | ICD-10-CM | POA: Diagnosis not present

## 2020-03-11 DIAGNOSIS — M6281 Muscle weakness (generalized): Secondary | ICD-10-CM | POA: Diagnosis not present

## 2020-03-12 DIAGNOSIS — Z23 Encounter for immunization: Secondary | ICD-10-CM | POA: Diagnosis not present

## 2020-03-13 DIAGNOSIS — L299 Pruritus, unspecified: Secondary | ICD-10-CM | POA: Diagnosis not present

## 2020-03-13 DIAGNOSIS — D519 Vitamin B12 deficiency anemia, unspecified: Secondary | ICD-10-CM | POA: Diagnosis not present

## 2020-03-13 DIAGNOSIS — W57XXXA Bitten or stung by nonvenomous insect and other nonvenomous arthropods, initial encounter: Secondary | ICD-10-CM | POA: Diagnosis not present

## 2020-03-13 DIAGNOSIS — Z6823 Body mass index (BMI) 23.0-23.9, adult: Secondary | ICD-10-CM | POA: Diagnosis not present

## 2020-03-13 DIAGNOSIS — E559 Vitamin D deficiency, unspecified: Secondary | ICD-10-CM | POA: Diagnosis not present

## 2020-03-13 DIAGNOSIS — Z23 Encounter for immunization: Secondary | ICD-10-CM | POA: Diagnosis not present

## 2020-03-13 DIAGNOSIS — E039 Hypothyroidism, unspecified: Secondary | ICD-10-CM | POA: Diagnosis not present

## 2020-03-13 DIAGNOSIS — M79644 Pain in right finger(s): Secondary | ICD-10-CM | POA: Diagnosis not present

## 2020-03-13 DIAGNOSIS — S80269D Insect bite (nonvenomous), unspecified knee, subsequent encounter: Secondary | ICD-10-CM | POA: Diagnosis not present

## 2020-03-13 DIAGNOSIS — R7301 Impaired fasting glucose: Secondary | ICD-10-CM | POA: Diagnosis not present

## 2020-03-13 DIAGNOSIS — S80262D Insect bite (nonvenomous), left knee, subsequent encounter: Secondary | ICD-10-CM | POA: Diagnosis not present

## 2020-03-13 DIAGNOSIS — N390 Urinary tract infection, site not specified: Secondary | ICD-10-CM | POA: Diagnosis not present

## 2020-03-18 DIAGNOSIS — R799 Abnormal finding of blood chemistry, unspecified: Secondary | ICD-10-CM | POA: Diagnosis not present

## 2020-03-18 DIAGNOSIS — D519 Vitamin B12 deficiency anemia, unspecified: Secondary | ICD-10-CM | POA: Diagnosis not present

## 2020-03-18 DIAGNOSIS — S80262D Insect bite (nonvenomous), left knee, subsequent encounter: Secondary | ICD-10-CM | POA: Diagnosis not present

## 2020-03-18 DIAGNOSIS — Z136 Encounter for screening for cardiovascular disorders: Secondary | ICD-10-CM | POA: Diagnosis not present

## 2020-03-18 DIAGNOSIS — N390 Urinary tract infection, site not specified: Secondary | ICD-10-CM | POA: Diagnosis not present

## 2020-03-18 DIAGNOSIS — L299 Pruritus, unspecified: Secondary | ICD-10-CM | POA: Diagnosis not present

## 2020-03-18 DIAGNOSIS — S80269D Insect bite (nonvenomous), unspecified knee, subsequent encounter: Secondary | ICD-10-CM | POA: Diagnosis not present

## 2020-03-18 DIAGNOSIS — M6281 Muscle weakness (generalized): Secondary | ICD-10-CM | POA: Diagnosis not present

## 2020-03-18 DIAGNOSIS — W57XXXA Bitten or stung by nonvenomous insect and other nonvenomous arthropods, initial encounter: Secondary | ICD-10-CM | POA: Diagnosis not present

## 2020-03-18 DIAGNOSIS — Z950 Presence of cardiac pacemaker: Secondary | ICD-10-CM | POA: Diagnosis not present

## 2020-03-18 DIAGNOSIS — M79644 Pain in right finger(s): Secondary | ICD-10-CM | POA: Diagnosis not present

## 2020-03-18 DIAGNOSIS — Z23 Encounter for immunization: Secondary | ICD-10-CM | POA: Diagnosis not present

## 2020-03-18 DIAGNOSIS — S30860A Insect bite (nonvenomous) of lower back and pelvis, initial encounter: Secondary | ICD-10-CM | POA: Diagnosis not present

## 2020-03-18 DIAGNOSIS — Z6823 Body mass index (BMI) 23.0-23.9, adult: Secondary | ICD-10-CM | POA: Diagnosis not present

## 2020-03-18 DIAGNOSIS — R002 Palpitations: Secondary | ICD-10-CM | POA: Diagnosis not present

## 2020-03-19 DIAGNOSIS — S30860A Insect bite (nonvenomous) of lower back and pelvis, initial encounter: Secondary | ICD-10-CM | POA: Diagnosis not present

## 2020-03-19 DIAGNOSIS — L299 Pruritus, unspecified: Secondary | ICD-10-CM | POA: Diagnosis not present

## 2020-03-19 DIAGNOSIS — Z23 Encounter for immunization: Secondary | ICD-10-CM | POA: Diagnosis not present

## 2020-03-19 DIAGNOSIS — S80269D Insect bite (nonvenomous), unspecified knee, subsequent encounter: Secondary | ICD-10-CM | POA: Diagnosis not present

## 2020-03-19 DIAGNOSIS — Z6823 Body mass index (BMI) 23.0-23.9, adult: Secondary | ICD-10-CM | POA: Diagnosis not present

## 2020-03-19 DIAGNOSIS — Z950 Presence of cardiac pacemaker: Secondary | ICD-10-CM | POA: Diagnosis not present

## 2020-03-19 DIAGNOSIS — M79644 Pain in right finger(s): Secondary | ICD-10-CM | POA: Diagnosis not present

## 2020-03-19 DIAGNOSIS — W57XXXA Bitten or stung by nonvenomous insect and other nonvenomous arthropods, initial encounter: Secondary | ICD-10-CM | POA: Diagnosis not present

## 2020-03-19 DIAGNOSIS — N390 Urinary tract infection, site not specified: Secondary | ICD-10-CM | POA: Diagnosis not present

## 2020-03-19 DIAGNOSIS — S80262D Insect bite (nonvenomous), left knee, subsequent encounter: Secondary | ICD-10-CM | POA: Diagnosis not present

## 2020-04-02 DIAGNOSIS — W57XXXA Bitten or stung by nonvenomous insect and other nonvenomous arthropods, initial encounter: Secondary | ICD-10-CM | POA: Diagnosis not present

## 2020-04-02 DIAGNOSIS — D519 Vitamin B12 deficiency anemia, unspecified: Secondary | ICD-10-CM | POA: Diagnosis not present

## 2020-04-02 DIAGNOSIS — S30860A Insect bite (nonvenomous) of lower back and pelvis, initial encounter: Secondary | ICD-10-CM | POA: Diagnosis not present

## 2020-04-02 DIAGNOSIS — Z950 Presence of cardiac pacemaker: Secondary | ICD-10-CM | POA: Diagnosis not present

## 2020-04-02 DIAGNOSIS — M79644 Pain in right finger(s): Secondary | ICD-10-CM | POA: Diagnosis not present

## 2020-04-02 DIAGNOSIS — Z23 Encounter for immunization: Secondary | ICD-10-CM | POA: Diagnosis not present

## 2020-04-02 DIAGNOSIS — Z6823 Body mass index (BMI) 23.0-23.9, adult: Secondary | ICD-10-CM | POA: Diagnosis not present

## 2020-04-02 DIAGNOSIS — S80269D Insect bite (nonvenomous), unspecified knee, subsequent encounter: Secondary | ICD-10-CM | POA: Diagnosis not present

## 2020-04-02 DIAGNOSIS — N390 Urinary tract infection, site not specified: Secondary | ICD-10-CM | POA: Diagnosis not present

## 2020-04-02 DIAGNOSIS — S80262D Insect bite (nonvenomous), left knee, subsequent encounter: Secondary | ICD-10-CM | POA: Diagnosis not present

## 2020-04-02 DIAGNOSIS — L299 Pruritus, unspecified: Secondary | ICD-10-CM | POA: Diagnosis not present

## 2020-04-05 ENCOUNTER — Other Ambulatory Visit: Payer: Self-pay

## 2020-04-05 ENCOUNTER — Ambulatory Visit (INDEPENDENT_AMBULATORY_CARE_PROVIDER_SITE_OTHER): Payer: Medicare Other | Admitting: Internal Medicine

## 2020-04-05 ENCOUNTER — Encounter: Payer: Self-pay | Admitting: Internal Medicine

## 2020-04-05 DIAGNOSIS — I442 Atrioventricular block, complete: Secondary | ICD-10-CM

## 2020-04-05 LAB — CUP PACEART INCLINIC DEVICE CHECK
Battery Remaining Longevity: 138 mo
Battery Voltage: 3.02 V
Brady Statistic RA Percent Paced: 34 %
Brady Statistic RV Percent Paced: 0.09 %
Date Time Interrogation Session: 20211203150121
Implantable Lead Implant Date: 20190622
Implantable Lead Implant Date: 20190622
Implantable Lead Location: 753859
Implantable Lead Location: 753860
Implantable Pulse Generator Implant Date: 20190622
Lead Channel Impedance Value: 375 Ohm
Lead Channel Impedance Value: 462.5 Ohm
Lead Channel Pacing Threshold Amplitude: 0.5 V
Lead Channel Pacing Threshold Amplitude: 1.25 V
Lead Channel Pacing Threshold Pulse Width: 0.5 ms
Lead Channel Pacing Threshold Pulse Width: 0.5 ms
Lead Channel Sensing Intrinsic Amplitude: 1.8 mV
Lead Channel Sensing Intrinsic Amplitude: 10.4 mV
Lead Channel Setting Pacing Amplitude: 1.5 V
Lead Channel Setting Pacing Amplitude: 2.5 V
Lead Channel Setting Pacing Pulse Width: 0.5 ms
Lead Channel Setting Sensing Sensitivity: 2 mV
Pulse Gen Model: 2272
Pulse Gen Serial Number: 9032820

## 2020-04-05 NOTE — Progress Notes (Signed)
HPI [name] [name] returns today for followup. She developed symptomatic bradycardia due to CHB and underwent PPM insertion over 2 years ago. She has done well in the interim. She still tends a garden.  No chest pain or sob. No syncope.No vision difficulties. Allergies  Allergen Reactions  . Sulfonamide Derivatives Itching and Swelling  . Alphagan [Brimonidine] Itching and Rash  . Cosopt [Dorzolamide Hcl-Timolol Mal] Itching, Swelling and Rash  . Lumigan [Bimatoprost] Itching and Rash  . Pilocarpine Itching and Rash  . Timolol Itching and Rash     No current outpatient medications on file.   No current facility-administered medications for this visit.     Past Medical History:  Diagnosis Date  . Arthritis   . Complication of anesthesia    pt states she had trouble waking up  . Frequency of urination   . Glaucoma   . Osteoporosis     ROS:   All systems reviewed and negative except as noted in the HPI.   Past Surgical History:  Procedure Laterality Date  . BACK SURGERY  1967 / 1970'S   X 2  . CHOLECYSTECTOMY  1980'S  . EYE SURGERY     cataract and lasik for glaucoma  . PACEMAKER IMPLANT N/A 10/23/2017   Procedure: PACEMAKER IMPLANT;  Surgeon: Hillis Range, MD;  [facility];  Service: Cardiovascular;  Laterality: N/A;  . TEMPORARY PACEMAKER N/A 10/23/2017   Procedure: TEMPORARY PACEMAKER;  Surgeon: Yvonne Kendall, MD;  [facility];  Service: Cardiovascular;  Laterality: N/A;  . TOTAL HIP ARTHROPLASTY Right 08/01/2013   Procedure: TOTAL RIGHT HIP ARTHROPLASTY ANTERIOR APPROACH;  Surgeon: Shelda Pal, MD;  Location: [facility];  Service: Orthopedics;  Laterality: Right;  . TOTAL HIP ARTHROPLASTY Left 10/15/2015   Procedure: LEFT TOTAL HIP ARTHROPLASTY ANTERIOR APPROACH;  Surgeon: Durene Romans, MD;  Location: [facility];  Service: Orthopedics;  Laterality: Left;  . WRIST FRACTURE SURGERY       History reviewed. No pertinent family  history.   Social History   Socioeconomic History  . Marital status: Married    Spouse name: Not on file  . Number of children: Not on file  . Years of education: Not on file  . Highest education level: Not on file  Occupational History  . Not on file  Tobacco Use  . Smoking status: Never Smoker  . Smokeless tobacco: Never Used  Vaping Use  . Vaping Use: Never used  Substance and Sexual Activity  . Alcohol use: Not Currently    Comment: OCCASIONAL WINE  . Drug use: No  . Sexual activity: Not on file  Other Topics Concern  . Not on file  Social History Narrative  . Not on file   Social Determinants of Health   Financial Resource Strain:   . Difficulty of Paying Living Expenses: Not on file  Food Insecurity:   . Worried About Programme researcher, broadcasting/film/video in the Last Year: Not on file  . Ran Out of Food in the Last Year: Not on file  Transportation Needs:   . Lack of Transportation (Medical): Not on file  . Lack of Transportation (Non-Medical): Not on file  Physical Activity:   . Days of Exercise per Week: Not on file  . Minutes of Exercise per Session: Not on file  Stress:   . Feeling of Stress : Not on file  Social Connections:   . Frequency of Communication with Friends and Family: Not  on file  . Frequency of Social Gatherings with Friends and Family: Not on file  . Attends Religious Services: Not on file  . Active Member of Clubs or Organizations: Not on file  . Attends Banker Meetings: Not on file  . Marital Status: Not on file  Intimate Partner Violence:   . Fear of Current or Ex-Partner: Not on file  . Emotionally Abused: Not on file  . Physically Abused: Not on file  . Sexually Abused: Not on file     BP 118/88   Pulse 63   Ht 5\' 4"  (1.626 m)   Wt 127 lb 3.2 oz (57.7 kg)   SpO2 98%   BMI 21.83 kg/m   Physical Exam:  Well appearing NAD HEENT: Unremarkable Neck:  No JVD, no thyromegally Lymphatics:  No adenopathy Back:  No CVA  tenderness Lungs:  Clear with no wheezes HEART:  Regular rate rhythm, no murmurs, no rubs, no clicks Abd:  soft, positive bowel sounds, no organomegally, no rebound, no guarding Ext:  2 plus pulses, no edema, no cyanosis, no clubbing Skin:  No rashes no nodules Neuro:  CN II through XII intact, motor grossly intact  EKG - nsr  DEVICE  Normal device function.  See PaceArt for details.   Assess/Plan: 1. Sinus node dysfunction - she is asymptomatic, s/p PPM insertion. She is pacing 34% 2. PPM -her St. Jude DDD PM is working normally. We will recheck in several months  Blessing Zaucha,MD

## 2020-04-05 NOTE — Patient Instructions (Signed)
Medication Instructions:  Your physician recommends that you continue on your current medications as directed. Please refer to the Current Medication list given to you today.  *If you need a refill on your cardiac medications before your next appointment, please call your pharmacy*   Lab Work: NONE   If you have labs (blood work) drawn today and your tests are completely normal, you will receive your results only by: . MyChart Message (if you have MyChart) OR . A paper copy in the mail If you have any lab test that is abnormal or we need to change your treatment, we will call you to review the results.   Testing/Procedures: NONE    Follow-Up: At [facility], you and your health needs are our priority.  As part of our continuing mission to provide you with exceptional heart care, we have created designated Provider Care Teams.  These Care Teams include your primary Cardiologist (physician) and Advanced Practice Providers (APPs -  Physician Assistants and Nurse Practitioners) who all work together to provide you with the care you need, when you need it.  We recommend signing up for the patient portal called "MyChart".  Sign up information is provided on this After Visit Summary.  MyChart is used to connect with patients for Virtual Visits (Telemedicine).  Patients are able to view lab/test results, encounter notes, upcoming appointments, etc.  Non-urgent messages can be sent to your provider as well.   To learn more about what you can do with MyChart, go to [email or web address].    Your next appointment:   1 year(s)  The format for your next appointment:   In Person  Provider:   Gregg Taylor, MD   Other Instructions Thank you for choosing [facility]!    

## 2020-04-25 ENCOUNTER — Ambulatory Visit (INDEPENDENT_AMBULATORY_CARE_PROVIDER_SITE_OTHER): Payer: Medicare Other

## 2020-04-25 DIAGNOSIS — I442 Atrioventricular block, complete: Secondary | ICD-10-CM | POA: Diagnosis not present

## 2020-04-25 LAB — CUP PACEART REMOTE DEVICE CHECK
Battery Remaining Longevity: 122 mo
Battery Remaining Percentage: 95.5 %
Battery Voltage: 3.02 V
Brady Statistic AP VP Percent: 1 %
Brady Statistic AP VS Percent: 33 %
Brady Statistic AS VP Percent: 1 %
Brady Statistic AS VS Percent: 67 %
Brady Statistic RA Percent Paced: 32 %
Brady Statistic RV Percent Paced: 1 %
Date Time Interrogation Session: 20211223020014
Implantable Lead Implant Date: 20190622
Implantable Lead Implant Date: 20190622
Implantable Lead Location: 753859
Implantable Lead Location: 753860
Implantable Pulse Generator Implant Date: 20190622
Lead Channel Impedance Value: 380 Ohm
Lead Channel Impedance Value: 440 Ohm
Lead Channel Pacing Threshold Amplitude: 0.625 V
Lead Channel Pacing Threshold Amplitude: 1.25 V
Lead Channel Pacing Threshold Pulse Width: 0.5 ms
Lead Channel Pacing Threshold Pulse Width: 0.5 ms
Lead Channel Sensing Intrinsic Amplitude: 2.6 mV
Lead Channel Sensing Intrinsic Amplitude: 9.6 mV
Lead Channel Setting Pacing Amplitude: 1.625
Lead Channel Setting Pacing Amplitude: 2.5 V
Lead Channel Setting Pacing Pulse Width: 0.5 ms
Lead Channel Setting Sensing Sensitivity: 2 mV
Pulse Gen Model: 2272
Pulse Gen Serial Number: 9032820

## 2020-05-08 NOTE — Progress Notes (Signed)
Remote pacemaker transmission.   

## 2020-06-12 IMAGING — CT CT ANGIO CHEST
2 of 6 series · 18 of 46 positions shown · IV contrast (Isovue)
Comparison: Chest radiograph 10/24/2017.

CLINICAL DATA: Left-sided chest pain. PE suspected, intermediate
prob, positive D-dimer

EXAM:
CT ANGIOGRAPHY CHEST WITH CONTRAST
TECHNIQUE: Multidetector CT imaging of the chest was performed using the
standard protocol during bolus administration of intravenous
contrast. Multiplanar CT image reconstructions and MIPs were
obtained to evaluate the vascular anatomy.
CONTRAST:  100mL FR6QOD-SO7 IOPAMIDOL (FR6QOD-SO7) INJECTION 76%

[Series 5: thins · axial · 0.61mm/px · z∈[-25,+211]mm · 15 of 260 slices shown]
[im 12/260  lung]
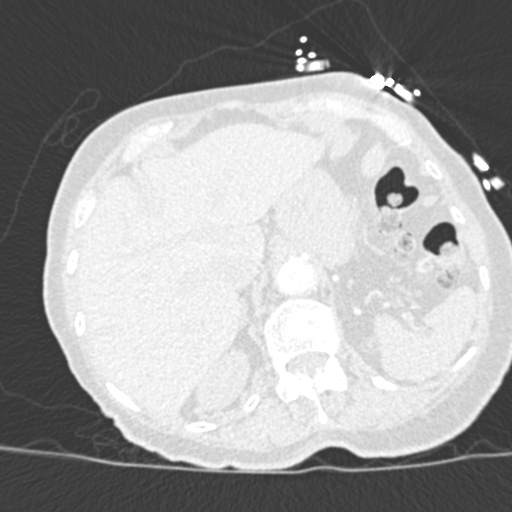
[im 34/260  soft-tissue]
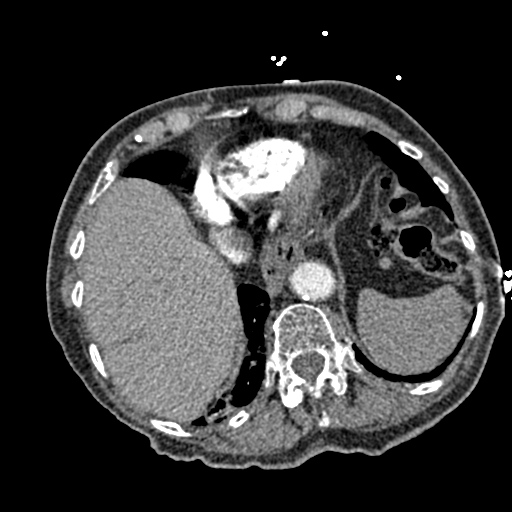
[im 46/260  lung]
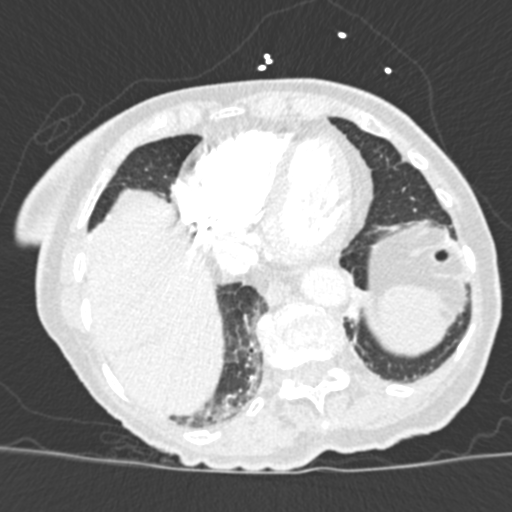
[im 68/260  soft-tissue]
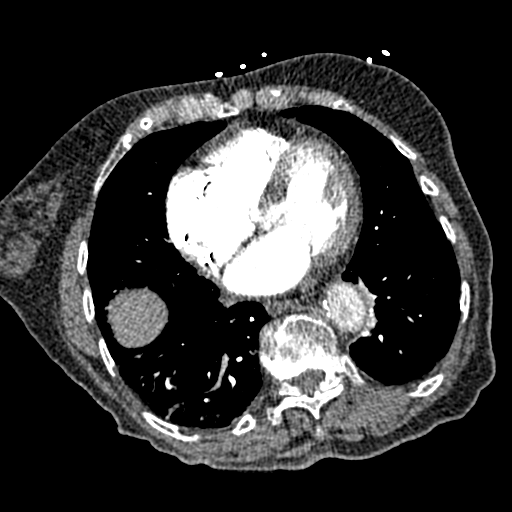
[im 79/260  lung]
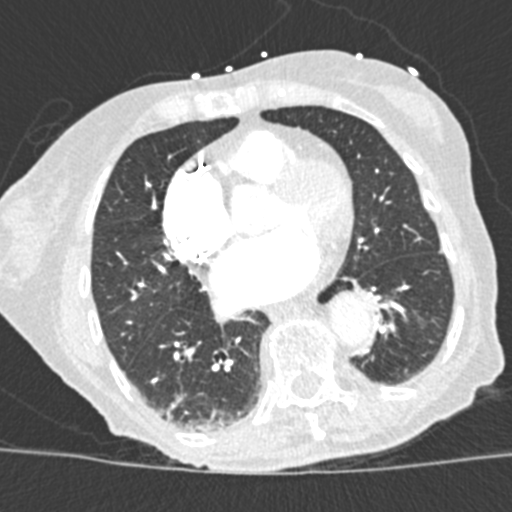
[im 102/260  soft-tissue]
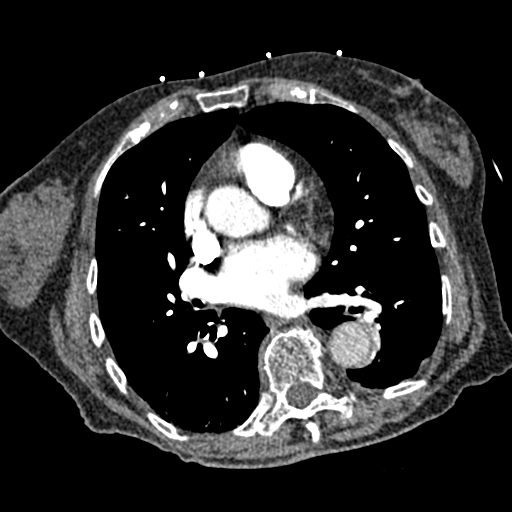
[im 113/260  lung]
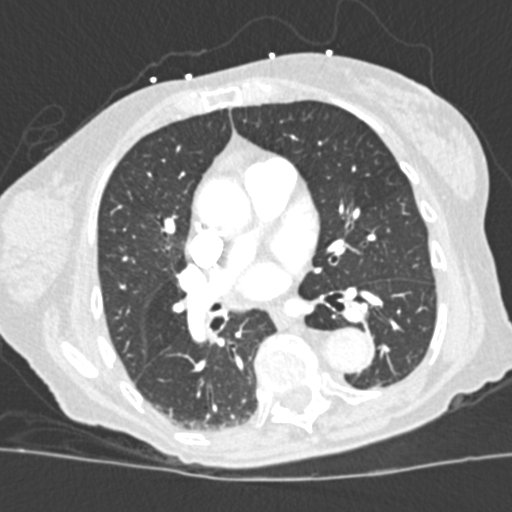
[im 136/260  soft-tissue]
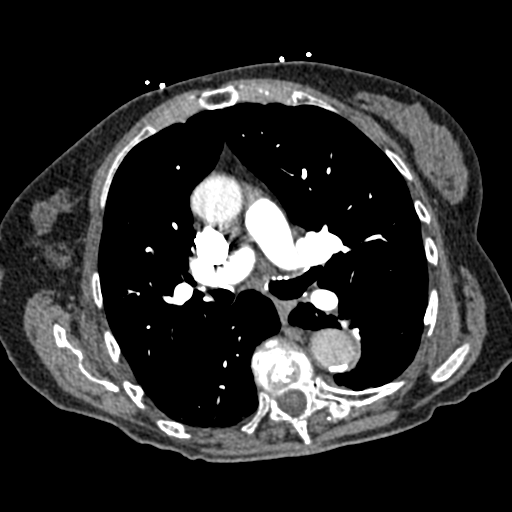
[im 147/260  lung]
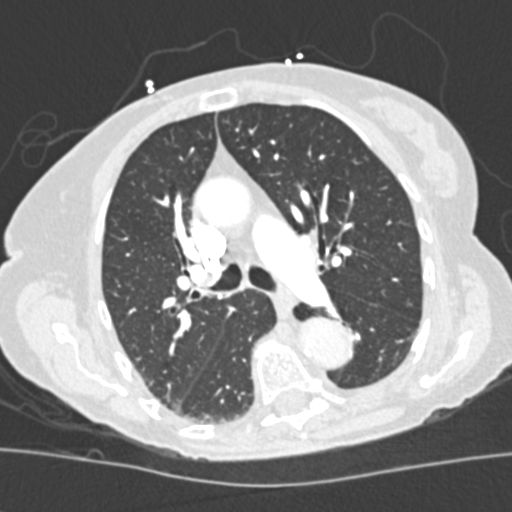
[im 158/260  soft-tissue]
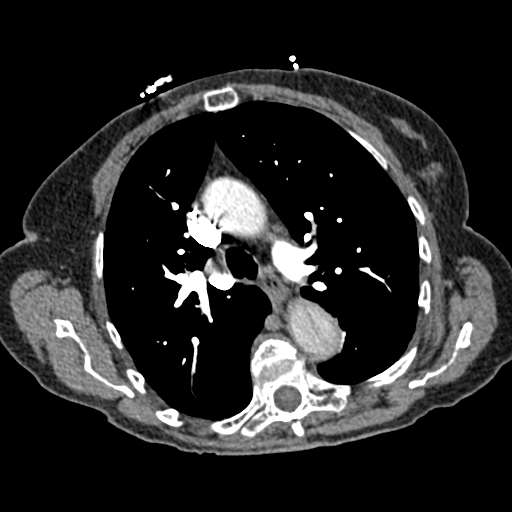
[im 181/260  lung]
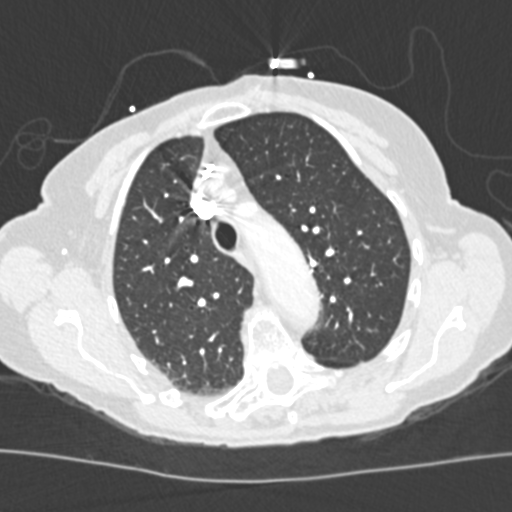
[im 192/260  soft-tissue]
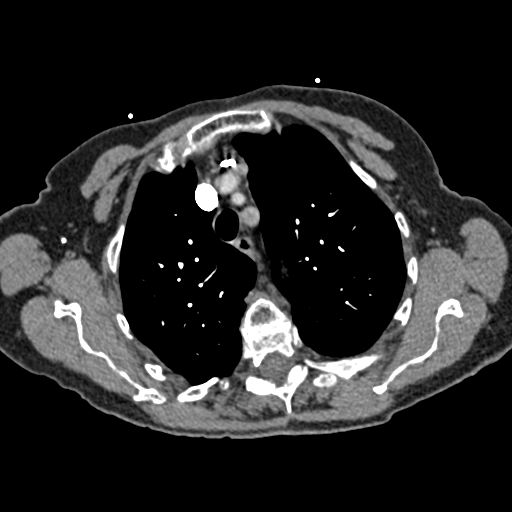
[im 214/260  lung]
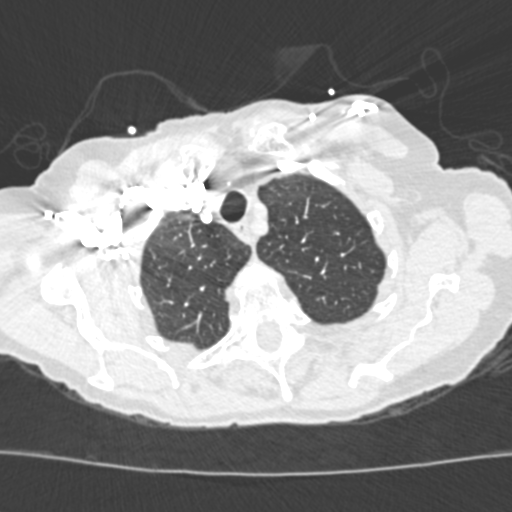
[im 226/260  soft-tissue]
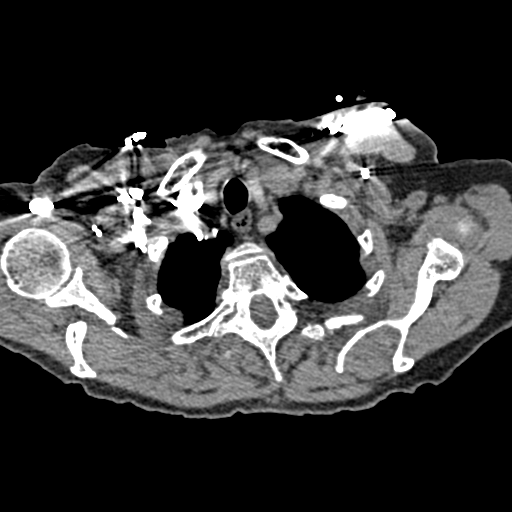
[im 248/260  lung]
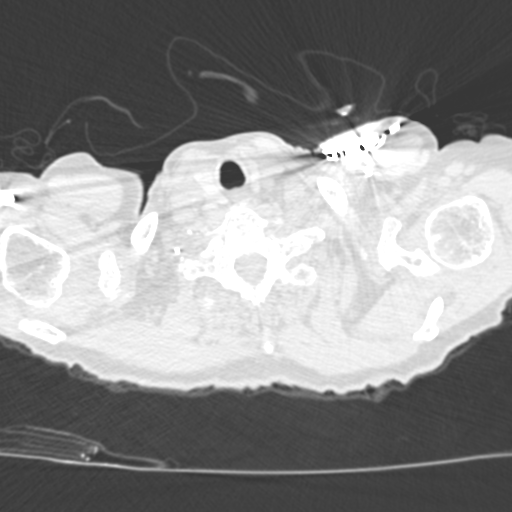

[Series 7: coronal mpr · coronal · 0.52mm/px · 3 of 126 slices shown]
[im 32/126  soft-tissue]
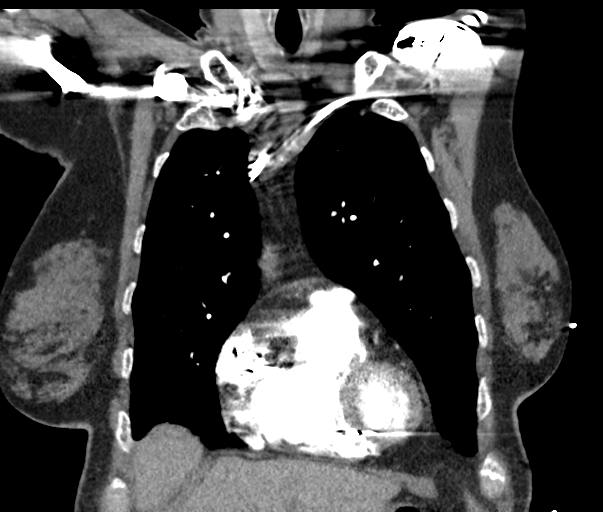
[im 63/126  soft-tissue]
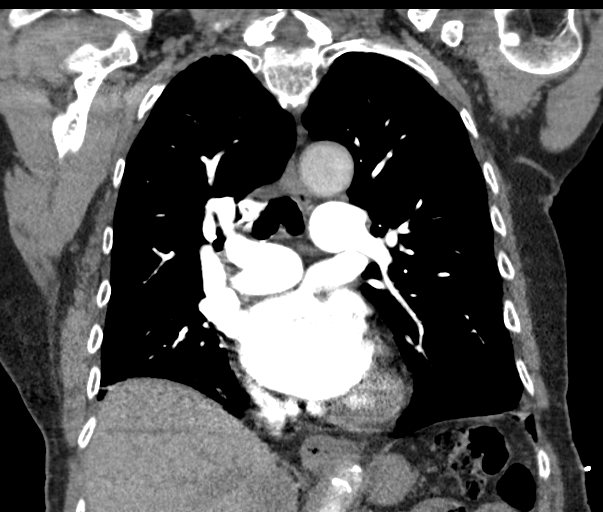
[im 94/126  soft-tissue]
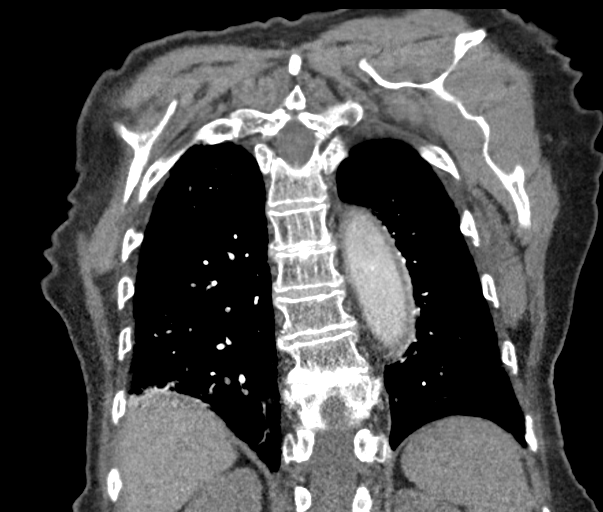

[18 of 46 positions shown; findings below may reference images not displayed]

FINDINGS: Cardiovascular: There are no filling defects within the pulmonary
arteries to suggest pulmonary embolus. Elongated tortuous thoracic
aorta with atherosclerosis. No evidence of acute aortic abnormality
or aneurysm. Mild cardiomegaly. Left-sided pacemaker in place. No
pericardial effusion.

Mediastinum/Nodes: No enlarged mediastinal or hilar lymph nodes.
Small hiatal hernia, the esophagus is otherwise decompressed. No
visualized thyroid nodule.

Lungs/Pleura: Right lower lobe and lingular atelectasis or scarring.
No confluent consolidation. No septal thickening or ground-glass
opacities to suggest pulmonary edema. No pleural fluid. Trachea and
mainstem bronchi are patent.

Upper Abdomen: No acute findings.

Musculoskeletal: Exaggerated thoracic lordosis with multilevel
degenerative change in the spine. There are no acute or suspicious
osseous abnormalities.

Review of the MIP images confirms the above findings.
IMPRESSION: 1. No pulmonary embolus or acute intrathoracic abnormality.
2. Small hiatal hernia.

Aortic Atherosclerosis ([id number]-NC9.9).

## 2020-07-25 ENCOUNTER — Ambulatory Visit (INDEPENDENT_AMBULATORY_CARE_PROVIDER_SITE_OTHER): Payer: Medicare Other

## 2020-07-25 DIAGNOSIS — H401111 Primary open-angle glaucoma, right eye, mild stage: Secondary | ICD-10-CM | POA: Diagnosis not present

## 2020-07-25 DIAGNOSIS — I459 Conduction disorder, unspecified: Secondary | ICD-10-CM | POA: Diagnosis not present

## 2020-07-25 DIAGNOSIS — H401122 Primary open-angle glaucoma, left eye, moderate stage: Secondary | ICD-10-CM | POA: Diagnosis not present

## 2020-07-25 DIAGNOSIS — Z961 Presence of intraocular lens: Secondary | ICD-10-CM | POA: Diagnosis not present

## 2020-07-27 LAB — CUP PACEART REMOTE DEVICE CHECK
Battery Remaining Longevity: 124 mo
Battery Remaining Percentage: 95.5 %
Battery Voltage: 3.01 V
Brady Statistic AP VP Percent: 1 %
Brady Statistic AP VS Percent: 36 %
Brady Statistic AS VP Percent: 1 %
Brady Statistic AS VS Percent: 64 %
Brady Statistic RA Percent Paced: 35 %
Brady Statistic RV Percent Paced: 1 %
Date Time Interrogation Session: 20220324020015
Implantable Lead Implant Date: 20190622
Implantable Lead Implant Date: 20190622
Implantable Lead Location: 753859
Implantable Lead Location: 753860
Implantable Pulse Generator Implant Date: 20190622
Lead Channel Impedance Value: 390 Ohm
Lead Channel Impedance Value: 460 Ohm
Lead Channel Pacing Threshold Amplitude: 0.625 V
Lead Channel Pacing Threshold Amplitude: 1.25 V
Lead Channel Pacing Threshold Pulse Width: 0.5 ms
Lead Channel Pacing Threshold Pulse Width: 0.5 ms
Lead Channel Sensing Intrinsic Amplitude: 1.9 mV
Lead Channel Sensing Intrinsic Amplitude: 8.7 mV
Lead Channel Setting Pacing Amplitude: 1.625
Lead Channel Setting Pacing Amplitude: 2.5 V
Lead Channel Setting Pacing Pulse Width: 0.5 ms
Lead Channel Setting Sensing Sensitivity: 2 mV
Pulse Gen Model: 2272
Pulse Gen Serial Number: 9032820

## 2020-08-05 NOTE — Progress Notes (Signed)
Remote pacemaker transmission.   

## 2020-09-19 ENCOUNTER — Ambulatory Visit
Admission: EM | Admit: 2020-09-19 | Discharge: 2020-09-19 | Disposition: A | Discharge status: Home / Self Care | Payer: Medicare Other

## 2020-09-19 ENCOUNTER — Other Ambulatory Visit: Payer: Self-pay

## 2020-09-19 ENCOUNTER — Encounter: Payer: Self-pay | Admitting: Emergency Medicine

## 2020-09-19 HISTORY — DX: Presence of cardiac pacemaker: Z95.0

## 2020-09-19 NOTE — ED Triage Notes (Signed)
States she woke up last night with pain in left groin area.  States she feels week this morning.  States she is no longer having pain in that area.

## 2020-09-19 NOTE — ED Provider Notes (Signed)
Patient reports sharp pain to LT upper leg that occurred 2-3 x last night.  Has a pacemaker and concerned for blockage.  Denies injury or trauma.  Called number for pacemaker and was told to follow up with cardiology, however, thought coming to urgent care would be faster.  Did not want to wait "4 hours" in ED.  Informed patient we do not have the imaging capabilities to rule out blockage, and encouraged her to follow up with cardiology today or go to the ED.  Patient reports she needs to leave because she is paying $15/ hr for transportation and is concerned she left stove on at home.  Left prior to being evaluated.     Rennis Harding, PA-C 09/19/20 1044

## 2020-10-24 ENCOUNTER — Ambulatory Visit (INDEPENDENT_AMBULATORY_CARE_PROVIDER_SITE_OTHER): Payer: Medicare Other

## 2020-10-24 DIAGNOSIS — I442 Atrioventricular block, complete: Secondary | ICD-10-CM | POA: Diagnosis not present

## 2020-10-24 LAB — CUP PACEART REMOTE DEVICE CHECK
Battery Remaining Longevity: 92 mo
Battery Remaining Percentage: 78 %
Battery Voltage: 3.01 V
Brady Statistic AP VP Percent: 1 %
Brady Statistic AP VS Percent: 35 %
Brady Statistic AS VP Percent: 1 %
Brady Statistic AS VS Percent: 64 %
Brady Statistic RA Percent Paced: 35 %
Brady Statistic RV Percent Paced: 1 %
Date Time Interrogation Session: 20220623020013
Implantable Lead Implant Date: 20190622
Implantable Lead Implant Date: 20190622
Implantable Lead Location: 753859
Implantable Lead Location: 753860
Implantable Pulse Generator Implant Date: 20190622
Lead Channel Impedance Value: 380 Ohm
Lead Channel Impedance Value: 450 Ohm
Lead Channel Pacing Threshold Amplitude: 0.5 V
Lead Channel Pacing Threshold Amplitude: 1.25 V
Lead Channel Pacing Threshold Pulse Width: 0.5 ms
Lead Channel Pacing Threshold Pulse Width: 0.5 ms
Lead Channel Sensing Intrinsic Amplitude: 10.2 mV
Lead Channel Sensing Intrinsic Amplitude: 2.9 mV
Lead Channel Setting Pacing Amplitude: 1.5 V
Lead Channel Setting Pacing Amplitude: 2.5 V
Lead Channel Setting Pacing Pulse Width: 0.5 ms
Lead Channel Setting Sensing Sensitivity: 2 mV
Pulse Gen Model: 2272
Pulse Gen Serial Number: 9032820

## 2020-11-05 ENCOUNTER — Encounter: Payer: Medicare Other | Admitting: Internal Medicine

## 2020-11-12 NOTE — Progress Notes (Signed)
Remote pacemaker transmission.   

## 2020-11-28 DIAGNOSIS — Z961 Presence of intraocular lens: Secondary | ICD-10-CM | POA: Diagnosis not present

## 2020-11-28 DIAGNOSIS — H401122 Primary open-angle glaucoma, left eye, moderate stage: Secondary | ICD-10-CM | POA: Diagnosis not present

## 2020-11-28 DIAGNOSIS — H401111 Primary open-angle glaucoma, right eye, mild stage: Secondary | ICD-10-CM | POA: Diagnosis not present

## 2021-01-15 DIAGNOSIS — Z23 Encounter for immunization: Secondary | ICD-10-CM | POA: Diagnosis not present

## 2021-01-23 ENCOUNTER — Ambulatory Visit (INDEPENDENT_AMBULATORY_CARE_PROVIDER_SITE_OTHER): Payer: Medicare Other

## 2021-01-23 DIAGNOSIS — I442 Atrioventricular block, complete: Secondary | ICD-10-CM

## 2021-01-23 LAB — CUP PACEART REMOTE DEVICE CHECK
Battery Remaining Longevity: 89 mo
Battery Remaining Percentage: 75 %
Battery Voltage: 3.01 V
Brady Statistic AP VP Percent: 1 %
Brady Statistic AP VS Percent: 37 %
Brady Statistic AS VP Percent: 1 %
Brady Statistic AS VS Percent: 63 %
Brady Statistic RA Percent Paced: 37 %
Brady Statistic RV Percent Paced: 1 %
Date Time Interrogation Session: 20220922020013
Implantable Lead Implant Date: 20190622
Implantable Lead Implant Date: 20190622
Implantable Lead Location: 753859
Implantable Lead Location: 753860
Implantable Pulse Generator Implant Date: 20190622
Lead Channel Impedance Value: 380 Ohm
Lead Channel Impedance Value: 440 Ohm
Lead Channel Pacing Threshold Amplitude: 0.625 V
Lead Channel Pacing Threshold Amplitude: 1.25 V
Lead Channel Pacing Threshold Pulse Width: 0.5 ms
Lead Channel Pacing Threshold Pulse Width: 0.5 ms
Lead Channel Sensing Intrinsic Amplitude: 2.5 mV
Lead Channel Sensing Intrinsic Amplitude: 7.7 mV
Lead Channel Setting Pacing Amplitude: 1.625
Lead Channel Setting Pacing Amplitude: 2.5 V
Lead Channel Setting Pacing Pulse Width: 0.5 ms
Lead Channel Setting Sensing Sensitivity: 2 mV
Pulse Gen Model: 2272
Pulse Gen Serial Number: 9032820

## 2021-01-29 NOTE — Progress Notes (Signed)
Remote pacemaker transmission.   

## 2021-02-06 DIAGNOSIS — Z23 Encounter for immunization: Secondary | ICD-10-CM | POA: Diagnosis not present

## 2021-02-12 ENCOUNTER — Emergency Department (HOSPITAL_COMMUNITY)
Admission: EM | Admit: 2021-02-12 | Discharge: 2021-02-12 | Disposition: A | Discharge status: Home / Self Care | Payer: Medicare Other | Attending: Emergency Medicine | Admitting: Emergency Medicine

## 2021-02-12 ENCOUNTER — Encounter (HOSPITAL_COMMUNITY): Payer: Self-pay | Admitting: *Deleted

## 2021-02-12 ENCOUNTER — Emergency Department (HOSPITAL_COMMUNITY): Payer: Medicare Other

## 2021-02-12 ENCOUNTER — Other Ambulatory Visit: Payer: Self-pay

## 2021-02-12 DIAGNOSIS — I442 Atrioventricular block, complete: Secondary | ICD-10-CM | POA: Diagnosis not present

## 2021-02-12 DIAGNOSIS — R0789 Other chest pain: Secondary | ICD-10-CM | POA: Diagnosis not present

## 2021-02-12 DIAGNOSIS — M4800 Spinal stenosis, site unspecified: Secondary | ICD-10-CM | POA: Diagnosis not present

## 2021-02-12 DIAGNOSIS — Z950 Presence of cardiac pacemaker: Secondary | ICD-10-CM | POA: Insufficient documentation

## 2021-02-12 DIAGNOSIS — Z96642 Presence of left artificial hip joint: Secondary | ICD-10-CM | POA: Insufficient documentation

## 2021-02-12 DIAGNOSIS — Z96643 Presence of artificial hip joint, bilateral: Secondary | ICD-10-CM | POA: Diagnosis not present

## 2021-02-12 DIAGNOSIS — M549 Dorsalgia, unspecified: Secondary | ICD-10-CM

## 2021-02-12 DIAGNOSIS — R918 Other nonspecific abnormal finding of lung field: Secondary | ICD-10-CM | POA: Diagnosis not present

## 2021-02-12 DIAGNOSIS — Z96641 Presence of right artificial hip joint: Secondary | ICD-10-CM | POA: Diagnosis not present

## 2021-02-12 DIAGNOSIS — M546 Pain in thoracic spine: Secondary | ICD-10-CM | POA: Insufficient documentation

## 2021-02-12 DIAGNOSIS — R9431 Abnormal electrocardiogram [ECG] [EKG]: Secondary | ICD-10-CM | POA: Diagnosis not present

## 2021-02-12 DIAGNOSIS — H409 Unspecified glaucoma: Secondary | ICD-10-CM | POA: Diagnosis not present

## 2021-02-12 DIAGNOSIS — K449 Diaphragmatic hernia without obstruction or gangrene: Secondary | ICD-10-CM | POA: Diagnosis not present

## 2021-02-12 DIAGNOSIS — M25512 Pain in left shoulder: Secondary | ICD-10-CM | POA: Diagnosis not present

## 2021-02-12 LAB — BASIC METABOLIC PANEL
Anion gap: 6 (ref 5–15)
BUN: 18 mg/dL (ref 8–23)
CO2: 29 mmol/L (ref 22–32)
Calcium: 9.1 mg/dL (ref 8.9–10.3)
Chloride: 104 mmol/L (ref 98–111)
Creatinine, Ser: 0.81 mg/dL (ref 0.44–1.00)
GFR, Estimated: >60 mL/min (ref >60)
Glucose, Bld: 90 mg/dL (ref 70–99)
Potassium: 4.2 mmol/L (ref 3.5–5.1)
Sodium: 139 mmol/L (ref 135–145)

## 2021-02-12 LAB — CBC WITH DIFFERENTIAL/PLATELET
Abs Immature Granulocytes: 0.01 10*3/uL (ref 0.00–0.07)
Basophils Absolute: 0.1 10*3/uL (ref 0.0–0.1)
Basophils Relative: 1 %
Eosinophils Absolute: 0.2 10*3/uL (ref 0.0–0.5)
Eosinophils Relative: 3 %
HCT: 47.2 % — ABNORMAL HIGH (ref 36.0–46.0)
Hemoglobin: 15.3 g/dL — ABNORMAL HIGH (ref 12.0–15.0)
Immature Granulocytes: 0 %
Lymphocytes Relative: 53 %
Lymphs Abs: 3.9 10*3/uL (ref 0.7–4.0)
MCH: 31.4 pg (ref 26.0–34.0)
MCHC: 32.4 g/dL (ref 30.0–36.0)
MCV: 96.7 fL (ref 80.0–100.0)
Monocytes Absolute: 0.8 10*3/uL (ref 0.1–1.0)
Monocytes Relative: 11 %
Neutro Abs: 2.3 10*3/uL (ref 1.7–7.7)
Neutrophils Relative %: 32 %
Platelets: 137 10*3/uL — ABNORMAL LOW (ref 150–400)
RBC: 4.88 MIL/uL (ref 3.87–5.11)
RDW: 12.2 % (ref 11.5–15.5)
WBC: 7.3 10*3/uL (ref 4.0–10.5)
nRBC: 0 % (ref 0.0–0.2)

## 2021-02-12 LAB — TROPONIN I (HIGH SENSITIVITY)
Troponin I (High Sensitivity): 13 ng/L (ref <18)
Troponin I (High Sensitivity): 14 ng/L (ref <18)

## 2021-02-12 MED ORDER — IOHEXOL 350 MG/ML SOLN
100.0000 mL | Freq: Once | INTRAVENOUS | Status: AC | PRN
  Administered 2021-02-12: 100 mL via INTRAVENOUS

## 2021-02-12 NOTE — ED Provider Notes (Signed)
[facility] EMERGENCY DEPARTMENT Provider Note   CSN: [id number] Arrival date & time: 02/12/21  1659     History Chief Complaint  Patient presents with   Back Pain    Jenille Laszlo Poythress is a 87 y.o. female.  Patient presents chief complaint of left-sided upper back pain.  Described as sharp and "severe."  She denies any fall or trauma.  She states that she was not doing anything at all when it started.  Denies any trauma or fall or new activity.  Denies fevers or cough or vomiting or diarrhea.  She states it feels worse when she moves her torso a certain way.      Past Medical History:  Diagnosis Date   Arthritis    Complication of anesthesia    pt states she had trouble waking up   Frequency of urination    Glaucoma    Osteoporosis    Pacemaker     Patient Active Problem List   Diagnosis Date Noted   Heart block 10/23/2017   Syncope 10/23/2017   Nausea and vomiting 10/23/2017   Complete heart block (HCC)    S/P left THA, AA 10/15/2015   Right leg weakness 08/23/2013   Unstable balance 08/23/2013   Expected blood loss anemia 08/02/2013   MUSCLE WEAKNESS (GENERALIZED) 11/29/2009   FATIGUE 11/29/2009   GENERALIZED PAIN 11/29/2009   OSTEOARTHRITIS, HIP, RIGHT 09/16/2009   HIP PAIN 03/14/2009   SPINAL STENOSIS 03/14/2009   SCOLIOSIS, LUMBAR SPINE 03/14/2009    Past Surgical History:  Procedure Laterality Date   BACK SURGERY  1967 / 1970'S   X 2   CHOLECYSTECTOMY  1980'S   EYE SURGERY     cataract and lasik for glaucoma   PACEMAKER IMPLANT N/A 10/23/2017   Procedure: PACEMAKER IMPLANT;  Surgeon: Hillis Range, MD;  Location: [facility];  Service: Cardiovascular;  Laterality: N/A;   TEMPORARY PACEMAKER N/A 10/23/2017   Procedure: TEMPORARY PACEMAKER;  Surgeon: Yvonne Kendall, MD;  Location: [facility];  Service: Cardiovascular;  Laterality: N/A;   TOTAL HIP ARTHROPLASTY Right 08/01/2013   Procedure: TOTAL RIGHT HIP ARTHROPLASTY ANTERIOR APPROACH;   Surgeon: Shelda Pal, MD;  Location: [facility];  Service: Orthopedics;  Laterality: Right;   TOTAL HIP ARTHROPLASTY Left 10/15/2015   Procedure: LEFT TOTAL HIP ARTHROPLASTY ANTERIOR APPROACH;  Surgeon: Durene Romans, MD;  Location: [facility];  Service: Orthopedics;  Laterality: Left;   WRIST FRACTURE SURGERY       OB History   No obstetric history on file.     Family History  Problem Relation Age of Onset   Heart attack Father     Social History   Tobacco Use   Smoking status: Never   Smokeless tobacco: Never  Vaping Use   Vaping Use: Never used  Substance Use Topics   Alcohol use: Not Currently    Comment: OCCASIONAL WINE   Drug use: No    Home Medications Prior to Admission medications   Not on File    Allergies    Sulfonamide derivatives, Alphagan [brimonidine], Cosopt [dorzolamide hcl-timolol mal], Lumigan [bimatoprost], Pilocarpine, and Timolol  Review of Systems   Review of Systems  Constitutional:  Negative for fever.  HENT:  Negative for ear pain.   Eyes:  Negative for pain.  Respiratory:  Negative for cough.   Cardiovascular:  Negative for chest pain.  Gastrointestinal:  Negative for abdominal pain.  Genitourinary:  Negative for flank pain.  Musculoskeletal:  Positive for back  pain.  Skin:  Negative for rash.  Neurological:  Negative for headaches.   Physical Exam Updated Vital Signs BP 140/81   Pulse (!) 59   Temp (!) 97.5 F (36.4 C) (Oral)   Resp (!) 21   Ht 5\' 4"  (1.626 m)   Wt 53.1 kg   SpO2 100%   BMI 20.08 kg/m   Physical Exam Constitutional:      General: She is not in acute distress.    Appearance: Normal appearance.  HENT:     Head: Normocephalic.     Nose: Nose normal.  Eyes:     Extraocular Movements: Extraocular movements intact.  Cardiovascular:     Rate and Rhythm: Normal rate.  Pulmonary:     Effort: Pulmonary effort is normal.  Musculoskeletal:        General: Normal range of motion.     Cervical back: Normal range of  motion.  Neurological:     General: No focal deficit present.     Mental Status: She is alert and oriented to person, place, and time. Mental status is at baseline.     Cranial Nerves: No cranial nerve deficit.     Motor: No weakness.     Gait: Gait normal.    ED Results / Procedures / Treatments   Labs (all labs ordered are listed, but only abnormal results are displayed) Labs Reviewed  CBC WITH DIFFERENTIAL/PLATELET - Abnormal; Notable for the following components:      Result Value   Hemoglobin 15.3 (*)    HCT 47.2 (*)    Platelets 137 (*)    All other components within normal limits  BASIC METABOLIC PANEL  TROPONIN I (HIGH SENSITIVITY)  TROPONIN I (HIGH SENSITIVITY)    EKG None  Radiology DG Chest 1 View  Result Date: 02/12/2021 CLINICAL DATA:  Intermittent upper back pain at the left scapula for several days. EXAM: CHEST  1 VIEW COMPARISON:  June 30, 2018 FINDINGS: A dual lead AICD is noted. The lungs are hyperinflated. Mild, diffuse, chronic appearing increased interstitial lung markings are seen. Mild left basilar linear scarring is noted. There is no evidence of acute infiltrate, pleural effusion or pneumothorax. The heart size and mediastinal contours are within normal limits. There is tortuosity of the descending thoracic aorta. The visualized skeletal structures are unremarkable. IMPRESSION: Chronic appearing increased interstitial lung markings without acute or active cardiopulmonary disease. Electronically Signed   By: July 02, 2018 M.D.   On: 02/12/2021 18:26   CT Angio Chest Aorta W and/or Wo Contrast  Result Date: 02/12/2021 CLINICAL DATA:  Chest pain or back pain. Aortic dissection suspected. EXAM: CT ANGIOGRAPHY CHEST WITH CONTRAST TECHNIQUE: Multidetector CT imaging of the chest was performed using the standard protocol during bolus administration of intravenous contrast. Multiplanar CT image reconstructions and MIPs were obtained to evaluate the  vascular anatomy. CONTRAST:  04/14/2021 OMNIPAQUE IOHEXOL 350 MG/ML SOLN COMPARISON:  04/03/2018 FINDINGS: Cardiovascular: Normal caliber thoracic aorta. Motion artifact in the aortic root. No evidence of aortic aneurysm or dissection. Great vessel origins are patent. A cardiac pacemaker is in place. Mild cardiac enlargement. No pericardial effusions. Limited opacification of the pulmonary arteries without obvious central embolus. Mediastinum/Nodes: Thyroid gland is unremarkable. Esophagus is decompressed. No significant lymphadenopathy. Small esophageal hiatal hernia. Lungs/Pleura: Emphysematous changes in the lungs. Slight fibrosis and bronchiectasis in the lung bases. No airspace disease or consolidation. No pleural effusions. No pneumothorax. Airways are patent. Upper Abdomen: No acute abnormalities demonstrated in the visualized upper abdomen.  Musculoskeletal: Degenerative changes throughout the spine. No destructive bone lesions. Review of the MIP images confirms the above findings. IMPRESSION: 1. No evidence of thoracic aortic aneurysm or dissection. 2. Chronic fibrosis and bronchitic changes in the lungs. No evidence of active pulmonary disease. 3. Cardiac enlargement. Electronically Signed   By: Burman Nieves M.D.   On: 02/12/2021 22:52    Procedures Procedures   Medications Ordered in ED Medications  iohexol (OMNIPAQUE) 350 MG/ML injection 100 mL (100 mLs Intravenous Contrast Given 02/12/21 2222)    ED Course  I have reviewed the triage vital signs and the nursing notes.  Pertinent labs & imaging results that were available during my care of the patient were reviewed by me and considered in my medical decision making (see chart for details).    MDM Rules/Calculators/A&P                           Patient relates that something other than musculoskeletal pain may be the cause.  CT angio pursued with no acute findings noted.  Patient states when she lies still she has minimal to no pain.   Advised continue Tylenol at home as needed.  Otherwise discharged home in stable condition advised follow-up with her doctor within the week.  Advised immediate return for worsening pain difficulty breathing or any additional concerns.  Final Clinical Impression(s) / ED Diagnoses Final diagnoses:  Thoracic back pain, unspecified back pain laterality, unspecified chronicity    Rx / DC Orders ED Discharge Orders     None        Cheryll Cockayne, MD 02/12/21 2303

## 2021-02-12 NOTE — Discharge Instructions (Addendum)
Your CT scan did not show any worrisome causes of your back pain.  Continue taking Tylenol as needed for pain at home.  Follow-up with your doctor within the week.  However return back to the ER immediately if you have chest pain difficulty breathing or worsening symptoms.

## 2021-02-12 NOTE — ED Triage Notes (Signed)
Pt with left upper back pain for several days.  Off and on, worse at times.  Denies any injury to her back. Pt denies any SOB, N/V or fevers.

## 2021-03-10 DIAGNOSIS — Z950 Presence of cardiac pacemaker: Secondary | ICD-10-CM | POA: Diagnosis not present

## 2021-03-10 DIAGNOSIS — Z96643 Presence of artificial hip joint, bilateral: Secondary | ICD-10-CM | POA: Diagnosis not present

## 2021-03-10 DIAGNOSIS — M4800 Spinal stenosis, site unspecified: Secondary | ICD-10-CM | POA: Diagnosis not present

## 2021-03-10 DIAGNOSIS — H409 Unspecified glaucoma: Secondary | ICD-10-CM | POA: Diagnosis not present

## 2021-04-08 ENCOUNTER — Other Ambulatory Visit: Payer: Self-pay

## 2021-04-08 ENCOUNTER — Ambulatory Visit (INDEPENDENT_AMBULATORY_CARE_PROVIDER_SITE_OTHER): Payer: Medicare Other | Admitting: Internal Medicine

## 2021-04-08 ENCOUNTER — Encounter: Payer: Self-pay | Admitting: Internal Medicine

## 2021-04-08 VITALS — BP 130/70 | HR 68 | Ht 64.0 in | Wt 118.2 lb

## 2021-04-08 DIAGNOSIS — I442 Atrioventricular block, complete: Secondary | ICD-10-CM

## 2021-04-08 NOTE — Progress Notes (Signed)
HPI Mrs. Nield returns today for followup. 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 She has done well in the interim. She still tends a garden.  No chest pain or sob. No syncope.No vision difficulties despite having glaucoma and not being on any meds. Allergies  Allergen Reactions   Sulfonamide Derivatives Itching and Swelling   Alphagan [Brimonidine] Itching and Rash   Cosopt [Dorzolamide Hcl-Timolol Mal] Itching, Swelling and Rash   Lumigan [Bimatoprost] Itching and Rash   Pilocarpine Itching and Rash   Timolol Itching and Rash     Current Outpatient Medications  Medication Sig Dispense Refill   penicillin v potassium (VEETID) 500 MG tablet Take 500 mg by mouth 3 (three) times daily.     No current facility-administered medications for this visit.     Past Medical History:  Diagnosis Date   Arthritis    Complication of anesthesia    pt states she had trouble waking up   Frequency of urination    Glaucoma    Osteoporosis    Pacemaker     ROS:   All systems reviewed and negative except as noted in the HPI.   Past Surgical History:  Procedure Laterality Date   BACK SURGERY  1967 / 1970'S   X 2   CHOLECYSTECTOMY  1980'S   EYE SURGERY     cataract and lasik for glaucoma   PACEMAKER IMPLANT N/A 10/23/2017   Procedure: PACEMAKER IMPLANT;  Surgeon: Hillis Range, MD;  Location: [facility];  Service: Cardiovascular;  Laterality: N/A;   TEMPORARY PACEMAKER N/A 10/23/2017   Procedure: TEMPORARY PACEMAKER;  Surgeon: Yvonne Kendall, MD;  Location: [facility];  Service: Cardiovascular;  Laterality: N/A;   TOTAL HIP ARTHROPLASTY Right 08/01/2013   Procedure: TOTAL RIGHT HIP ARTHROPLASTY ANTERIOR APPROACH;  Surgeon: Shelda Pal, MD;  Location: [facility];  Service: Orthopedics;  Laterality: Right;   TOTAL HIP ARTHROPLASTY Left 10/15/2015   Procedure: LEFT TOTAL HIP ARTHROPLASTY ANTERIOR APPROACH;   Surgeon: Durene Romans, MD;  Location: [facility];  Service: Orthopedics;  Laterality: Left;   WRIST FRACTURE SURGERY       Family History  Problem Relation Age of Onset   Heart attack Father      Social History   Socioeconomic History   Marital status: Married    Spouse name: Not on file   Number of children: Not on file   Years of education: Not on file   Highest education level: Not on file  Occupational History   Not on file  Tobacco Use   Smoking status: Never   Smokeless tobacco: Never  Vaping Use   Vaping Use: Never used  Substance and Sexual Activity   Alcohol use: Not Currently    Comment: OCCASIONAL WINE   Drug use: No   Sexual activity: Not on file  Other Topics Concern   Not on file  Social History Narrative   Not on file   Social Determinants of Health   Financial Resource Strain: Not on file  Food Insecurity: Not on file  Transportation Needs: Not on file  Physical Activity: Not on file  Stress: Not on file  Social Connections: Not on file  Intimate Partner Violence: Not on file     BP 130/70   Pulse 68   Ht 5\' 4"  (1.626 m)   Wt 118 lb 3.2 oz (53.6 kg)   SpO2 96%   BMI  20.29 kg/m   Physical Exam:  Well appearing NAD HEENT: Unremarkable Neck:  No JVD, no thyromegally Lymphatics:  No adenopathy Back:  No CVA tenderness Lungs:  Clear with no wheezes HEART:  Regular rate rhythm, no murmurs, no rubs, no clicks Abd:  soft, positive bowel sounds, no organomegally, no rebound, no guarding Ext:  2 plus pulses, no edema, no cyanosis, no clubbing Skin:  No rashes no nodules Neuro:  CN II through XII intact, motor grossly intact  DEVICE  Normal device function.  See PaceArt for details.   Assess/Plan:  1. Sinus node dysfunction - she is asymptomatic, s/p PPM insertion. She is pacing 37% 2. PPM -her St. Jude DDD PM is working normally. We will recheck in several months   Sharlot Gowda Tuwanda Vokes,MD

## 2021-04-08 NOTE — Patient Instructions (Signed)
Medication Instructions:  Your physician recommends that you continue on your current medications as directed. Please refer to the Current Medication list given to you today.  *If you need a refill on your cardiac medications before your next appointment, please call your pharmacy*   Lab Work: NONE   If you have labs (blood work) drawn today and your tests are completely normal, you will receive your results only by: . MyChart Message (if you have MyChart) OR . A paper copy in the mail If you have any lab test that is abnormal or we need to change your treatment, we will call you to review the results.   Testing/Procedures: NONE    Follow-Up: At [facility], you and your health needs are our priority.  As part of our continuing mission to provide you with exceptional heart care, we have created designated Provider Care Teams.  These Care Teams include your primary Cardiologist (physician) and Advanced Practice Providers (APPs -  Physician Assistants and Nurse Practitioners) who all work together to provide you with the care you need, when you need it.  We recommend signing up for the patient portal called "MyChart".  Sign up information is provided on this After Visit Summary.  MyChart is used to connect with patients for Virtual Visits (Telemedicine).  Patients are able to view lab/test results, encounter notes, upcoming appointments, etc.  Non-urgent messages can be sent to your provider as well.   To learn more about what you can do with MyChart, go to [email or web address].    Your next appointment:   1 year(s)  The format for your next appointment:   In Person  Provider:   Gregg Taylor, MD   Other Instructions Thank you for choosing [facility]!    

## 2021-04-10 DIAGNOSIS — K047 Periapical abscess without sinus: Secondary | ICD-10-CM | POA: Diagnosis not present

## 2021-04-10 DIAGNOSIS — M4800 Spinal stenosis, site unspecified: Secondary | ICD-10-CM | POA: Diagnosis not present

## 2021-04-24 ENCOUNTER — Ambulatory Visit (INDEPENDENT_AMBULATORY_CARE_PROVIDER_SITE_OTHER): Payer: Medicare Other

## 2021-04-24 DIAGNOSIS — I442 Atrioventricular block, complete: Secondary | ICD-10-CM

## 2021-04-24 LAB — CUP PACEART REMOTE DEVICE CHECK
Battery Remaining Longevity: 88 mo
Battery Remaining Percentage: 73 %
Battery Voltage: 3.01 V
Brady Statistic AP VP Percent: 1 %
Brady Statistic AP VS Percent: 42 %
Brady Statistic AS VP Percent: 1 %
Brady Statistic AS VS Percent: 58 %
Brady Statistic RA Percent Paced: 41 %
Brady Statistic RV Percent Paced: 1 %
Date Time Interrogation Session: 20221222020012
Implantable Lead Implant Date: 20190622
Implantable Lead Implant Date: 20190622
Implantable Lead Location: 753859
Implantable Lead Location: 753860
Implantable Pulse Generator Implant Date: 20190622
Lead Channel Impedance Value: 390 Ohm
Lead Channel Impedance Value: 450 Ohm
Lead Channel Pacing Threshold Amplitude: 0.625 V
Lead Channel Pacing Threshold Amplitude: 1.25 V
Lead Channel Pacing Threshold Pulse Width: 0.5 ms
Lead Channel Pacing Threshold Pulse Width: 0.5 ms
Lead Channel Sensing Intrinsic Amplitude: 11 mV
Lead Channel Sensing Intrinsic Amplitude: 2.6 mV
Lead Channel Setting Pacing Amplitude: 1.625
Lead Channel Setting Pacing Amplitude: 2.5 V
Lead Channel Setting Pacing Pulse Width: 0.5 ms
Lead Channel Setting Sensing Sensitivity: 2 mV
Pulse Gen Model: 2272
Pulse Gen Serial Number: 9032820

## 2021-05-02 NOTE — Progress Notes (Signed)
Remote pacemaker transmission.   

## 2021-05-29 DIAGNOSIS — H401111 Primary open-angle glaucoma, right eye, mild stage: Secondary | ICD-10-CM | POA: Diagnosis not present

## 2021-05-29 DIAGNOSIS — H401122 Primary open-angle glaucoma, left eye, moderate stage: Secondary | ICD-10-CM | POA: Diagnosis not present

## 2021-06-12 DIAGNOSIS — M4800 Spinal stenosis, site unspecified: Secondary | ICD-10-CM | POA: Diagnosis not present

## 2021-06-12 DIAGNOSIS — H409 Unspecified glaucoma: Secondary | ICD-10-CM | POA: Diagnosis not present

## 2021-06-12 DIAGNOSIS — I442 Atrioventricular block, complete: Secondary | ICD-10-CM | POA: Diagnosis not present

## 2021-07-24 ENCOUNTER — Ambulatory Visit (INDEPENDENT_AMBULATORY_CARE_PROVIDER_SITE_OTHER): Payer: Medicare Other

## 2021-07-24 DIAGNOSIS — I442 Atrioventricular block, complete: Secondary | ICD-10-CM | POA: Diagnosis not present

## 2021-07-25 LAB — CUP PACEART REMOTE DEVICE CHECK
Battery Remaining Longevity: 84 mo
Battery Remaining Percentage: 71 %
Battery Voltage: 3.01 V
Brady Statistic AP VP Percent: 1 %
Brady Statistic AP VS Percent: 38 %
Brady Statistic AS VP Percent: 1 %
Brady Statistic AS VS Percent: 62 %
Brady Statistic RA Percent Paced: 38 %
Brady Statistic RV Percent Paced: 1 %
Date Time Interrogation Session: 20230323020012
Implantable Lead Implant Date: 20190622
Implantable Lead Implant Date: 20190622
Implantable Lead Location: 753859
Implantable Lead Location: 753860
Implantable Pulse Generator Implant Date: 20190622
Lead Channel Impedance Value: 380 Ohm
Lead Channel Impedance Value: 450 Ohm
Lead Channel Pacing Threshold Amplitude: 0.5 V
Lead Channel Pacing Threshold Amplitude: 1.25 V
Lead Channel Pacing Threshold Pulse Width: 0.5 ms
Lead Channel Pacing Threshold Pulse Width: 0.5 ms
Lead Channel Sensing Intrinsic Amplitude: 2.6 mV
Lead Channel Sensing Intrinsic Amplitude: 7.4 mV
Lead Channel Setting Pacing Amplitude: 1.5 V
Lead Channel Setting Pacing Amplitude: 2.5 V
Lead Channel Setting Pacing Pulse Width: 0.5 ms
Lead Channel Setting Sensing Sensitivity: 2 mV
Pulse Gen Model: 2272
Pulse Gen Serial Number: 9032820

## 2021-07-31 NOTE — Progress Notes (Signed)
Remote pacemaker transmission.   

## 2021-08-06 DIAGNOSIS — H409 Unspecified glaucoma: Secondary | ICD-10-CM | POA: Diagnosis not present

## 2021-08-06 DIAGNOSIS — Z6820 Body mass index (BMI) 20.0-20.9, adult: Secondary | ICD-10-CM | POA: Diagnosis not present

## 2021-08-06 DIAGNOSIS — M4800 Spinal stenosis, site unspecified: Secondary | ICD-10-CM | POA: Diagnosis not present

## 2021-10-15 DIAGNOSIS — Z23 Encounter for immunization: Secondary | ICD-10-CM | POA: Diagnosis not present

## 2021-10-21 DIAGNOSIS — H409 Unspecified glaucoma: Secondary | ICD-10-CM | POA: Diagnosis not present

## 2021-10-21 DIAGNOSIS — Z96643 Presence of artificial hip joint, bilateral: Secondary | ICD-10-CM | POA: Diagnosis not present

## 2021-10-21 DIAGNOSIS — M4800 Spinal stenosis, site unspecified: Secondary | ICD-10-CM | POA: Diagnosis not present

## 2021-10-21 DIAGNOSIS — Z950 Presence of cardiac pacemaker: Secondary | ICD-10-CM | POA: Diagnosis not present

## 2021-10-21 DIAGNOSIS — Z6820 Body mass index (BMI) 20.0-20.9, adult: Secondary | ICD-10-CM | POA: Diagnosis not present

## 2021-10-23 ENCOUNTER — Ambulatory Visit (INDEPENDENT_AMBULATORY_CARE_PROVIDER_SITE_OTHER): Payer: Medicare Other

## 2021-10-23 DIAGNOSIS — I442 Atrioventricular block, complete: Secondary | ICD-10-CM

## 2021-10-23 LAB — CUP PACEART REMOTE DEVICE CHECK
Battery Remaining Longevity: 82 mo
Battery Remaining Percentage: 69 %
Battery Voltage: 3.01 V
Brady Statistic AP VP Percent: 1 %
Brady Statistic AP VS Percent: 37 %
Brady Statistic AS VP Percent: 1 %
Brady Statistic AS VS Percent: 63 %
Brady Statistic RA Percent Paced: 37 %
Brady Statistic RV Percent Paced: 1 %
Date Time Interrogation Session: 20230622024826
Implantable Lead Implant Date: 20190622
Implantable Lead Implant Date: 20190622
Implantable Lead Location: 753859
Implantable Lead Location: 753860
Implantable Pulse Generator Implant Date: 20190622
Lead Channel Impedance Value: 380 Ohm
Lead Channel Impedance Value: 450 Ohm
Lead Channel Pacing Threshold Amplitude: 0.875 V
Lead Channel Pacing Threshold Amplitude: 1.25 V
Lead Channel Pacing Threshold Pulse Width: 0.5 ms
Lead Channel Pacing Threshold Pulse Width: 0.5 ms
Lead Channel Sensing Intrinsic Amplitude: 2.3 mV
Lead Channel Sensing Intrinsic Amplitude: 8.4 mV
Lead Channel Setting Pacing Amplitude: 1.875
Lead Channel Setting Pacing Amplitude: 2.5 V
Lead Channel Setting Pacing Pulse Width: 0.5 ms
Lead Channel Setting Sensing Sensitivity: 2 mV
Pulse Gen Model: 2272
Pulse Gen Serial Number: 9032820

## 2021-10-30 NOTE — Progress Notes (Signed)
Remote pacemaker transmission.   

## 2021-11-17 DIAGNOSIS — Z961 Presence of intraocular lens: Secondary | ICD-10-CM | POA: Diagnosis not present

## 2021-11-17 DIAGNOSIS — H401111 Primary open-angle glaucoma, right eye, mild stage: Secondary | ICD-10-CM | POA: Diagnosis not present

## 2021-11-17 DIAGNOSIS — H401122 Primary open-angle glaucoma, left eye, moderate stage: Secondary | ICD-10-CM | POA: Diagnosis not present

## 2022-01-06 DIAGNOSIS — Z6820 Body mass index (BMI) 20.0-20.9, adult: Secondary | ICD-10-CM | POA: Diagnosis not present

## 2022-01-06 DIAGNOSIS — H409 Unspecified glaucoma: Secondary | ICD-10-CM | POA: Diagnosis not present

## 2022-01-06 DIAGNOSIS — M4800 Spinal stenosis, site unspecified: Secondary | ICD-10-CM | POA: Diagnosis not present

## 2022-01-06 DIAGNOSIS — R531 Weakness: Secondary | ICD-10-CM | POA: Diagnosis not present

## 2022-01-06 DIAGNOSIS — Z950 Presence of cardiac pacemaker: Secondary | ICD-10-CM | POA: Diagnosis not present

## 2022-01-22 ENCOUNTER — Ambulatory Visit (INDEPENDENT_AMBULATORY_CARE_PROVIDER_SITE_OTHER): Payer: Medicare Other

## 2022-01-22 DIAGNOSIS — I442 Atrioventricular block, complete: Secondary | ICD-10-CM | POA: Diagnosis not present

## 2022-01-22 LAB — CUP PACEART REMOTE DEVICE CHECK
Battery Remaining Longevity: 79 mo
Battery Remaining Percentage: 67 %
Battery Voltage: 3.01 V
Brady Statistic AP VP Percent: 1 %
Brady Statistic AP VS Percent: 37 %
Brady Statistic AS VP Percent: 1 %
Brady Statistic AS VS Percent: 63 %
Brady Statistic RA Percent Paced: 36 %
Brady Statistic RV Percent Paced: 1 %
Date Time Interrogation Session: 20230921020012
Implantable Lead Implant Date: 20190622
Implantable Lead Implant Date: 20190622
Implantable Lead Location: 753859
Implantable Lead Location: 753860
Implantable Pulse Generator Implant Date: 20190622
Lead Channel Impedance Value: 350 Ohm
Lead Channel Impedance Value: 440 Ohm
Lead Channel Pacing Threshold Amplitude: 0.875 V
Lead Channel Pacing Threshold Amplitude: 1.25 V
Lead Channel Pacing Threshold Pulse Width: 0.5 ms
Lead Channel Pacing Threshold Pulse Width: 0.5 ms
Lead Channel Sensing Intrinsic Amplitude: 1.8 mV
Lead Channel Sensing Intrinsic Amplitude: 8.4 mV
Lead Channel Setting Pacing Amplitude: 1.875
Lead Channel Setting Pacing Amplitude: 2.5 V
Lead Channel Setting Pacing Pulse Width: 0.5 ms
Lead Channel Setting Sensing Sensitivity: 2 mV
Pulse Gen Model: 2272
Pulse Gen Serial Number: 9032820

## 2022-02-02 NOTE — Progress Notes (Signed)
Remote pacemaker transmission.   

## 2022-02-10 DIAGNOSIS — H409 Unspecified glaucoma: Secondary | ICD-10-CM | POA: Diagnosis not present

## 2022-02-10 DIAGNOSIS — M4800 Spinal stenosis, site unspecified: Secondary | ICD-10-CM | POA: Diagnosis not present

## 2022-02-10 DIAGNOSIS — Z6820 Body mass index (BMI) 20.0-20.9, adult: Secondary | ICD-10-CM | POA: Diagnosis not present

## 2022-02-10 DIAGNOSIS — Z23 Encounter for immunization: Secondary | ICD-10-CM | POA: Diagnosis not present

## 2022-02-10 DIAGNOSIS — Z950 Presence of cardiac pacemaker: Secondary | ICD-10-CM | POA: Diagnosis not present

## 2022-04-14 ENCOUNTER — Encounter: Payer: Self-pay | Admitting: Internal Medicine

## 2022-04-14 ENCOUNTER — Ambulatory Visit: Payer: Medicare Other | Attending: Internal Medicine | Admitting: Internal Medicine

## 2022-04-14 VITALS — BP 124/80 | HR 80 | Ht 64.0 in | Wt 118.0 lb

## 2022-04-14 DIAGNOSIS — I442 Atrioventricular block, complete: Secondary | ICD-10-CM | POA: Diagnosis not present

## 2022-04-14 DIAGNOSIS — Z45018 Encounter for adjustment and management of other part of cardiac pacemaker: Secondary | ICD-10-CM | POA: Diagnosis not present

## 2022-04-14 LAB — CUP PACEART INCLINIC DEVICE CHECK
Battery Remaining Longevity: 86 mo
Battery Voltage: 3.01 V
Brady Statistic RA Percent Paced: 34 %
Brady Statistic RV Percent Paced: 0.17 %
Date Time Interrogation Session: 20231212135702
Implantable Lead Connection Status: 753985
Implantable Lead Connection Status: 753985
Implantable Lead Implant Date: 20190622
Implantable Lead Implant Date: 20190622
Implantable Lead Location: 753859
Implantable Lead Location: 753860
Implantable Pulse Generator Implant Date: 20190622
Lead Channel Impedance Value: 375 Ohm
Lead Channel Impedance Value: 450 Ohm
Lead Channel Pacing Threshold Amplitude: 0.5 V
Lead Channel Pacing Threshold Amplitude: 0.5 V
Lead Channel Pacing Threshold Amplitude: 1.25 V
Lead Channel Pacing Threshold Amplitude: 1.25 V
Lead Channel Pacing Threshold Pulse Width: 0.5 ms
Lead Channel Pacing Threshold Pulse Width: 0.5 ms
Lead Channel Pacing Threshold Pulse Width: 0.5 ms
Lead Channel Pacing Threshold Pulse Width: 0.5 ms
Lead Channel Sensing Intrinsic Amplitude: 10.6 mV
Lead Channel Sensing Intrinsic Amplitude: 2.6 mV
Lead Channel Setting Pacing Amplitude: 2 V
Lead Channel Setting Pacing Amplitude: 2.5 V
Lead Channel Setting Pacing Pulse Width: 0.5 ms
Lead Channel Setting Sensing Sensitivity: 2 mV
Pulse Gen Model: 2272
Pulse Gen Serial Number: 9032820

## 2022-04-14 NOTE — Patient Instructions (Signed)
Medication Instructions:  Your physician recommends that you continue on your current medications as directed. Please refer to the Current Medication list given to you today.  *If you need a refill on your cardiac medications before your next appointment, please call your pharmacy*   Lab Work: NONE   If you have labs (blood work) drawn today and your tests are completely normal, you will receive your results only by: MyChart Message (if you have MyChart) OR A paper copy in the mail If you have any lab test that is abnormal or we need to change your treatment, we will call you to review the results.   Testing/Procedures: NONE    Follow-Up: At [facility], you and your health needs are our priority.  As part of our continuing mission to provide you with exceptional heart care, we have created designated Provider Care Teams.  These Care Teams include your primary Cardiologist (physician) and Advanced Practice Providers (APPs -  Physician Assistants and Nurse Practitioners) who all work together to provide you with the care you need, when you need it.  We recommend signing up for the patient portal called "MyChart".  Sign up information is provided on this After Visit Summary.  MyChart is used to connect with patients for Virtual Visits (Telemedicine).  Patients are able to view lab/test results, encounter notes, upcoming appointments, etc.  Non-urgent messages can be sent to your provider as well.   To learn more about what you can do with MyChart, go to [email or web address].    Your next appointment:   1 year(s)  The format for your next appointment:   In Person  Provider:   Gregg Taylor, MD    Other Instructions Thank you for choosing [facility]!    Important Information About Sugar       

## 2022-04-14 NOTE — Progress Notes (Signed)
HPI Mrs. Skalicky returns today for followup. She developed symptomatic bradycardia due to CHB and underwent PPM insertion over 4 years ago. She has done well in the interim. She still tends a garden.  No chest pain or sob. No syncope.No vision difficulties despite having glaucoma and not being on any meds.  Allergies  Allergen Reactions   Sulfonamide Derivatives Itching and Swelling   Alphagan [Brimonidine] Itching and Rash   Cosopt [Dorzolamide Hcl-Timolol Mal] Itching, Swelling and Rash   Lumigan [Bimatoprost] Itching and Rash   Pilocarpine Itching and Rash   Timolol Itching and Rash     No current outpatient medications on file.   No current facility-administered medications for this visit.     Past Medical History:  Diagnosis Date   Arthritis    Complication of anesthesia    pt states she had trouble waking up   Frequency of urination    Glaucoma    Osteoporosis    Pacemaker     ROS:   All systems reviewed and negative except as noted in the HPI.   Past Surgical History:  Procedure Laterality Date   BACK SURGERY  1967 / 1970'S   X 2   CHOLECYSTECTOMY  1980'S   EYE SURGERY     cataract and lasik for glaucoma   PACEMAKER IMPLANT N/A 10/23/2017   Procedure: PACEMAKER IMPLANT;  Surgeon: Hillis Range, MD;  [facility]: [facility];  Service: Cardiovascular;  Laterality: N/A;   TEMPORARY PACEMAKER N/A 10/23/2017   Procedure: TEMPORARY PACEMAKER;  Surgeon: Yvonne Kendall, MD;  [facility]: [facility];  Service: Cardiovascular;  Laterality: N/A;   TOTAL HIP ARTHROPLASTY Right 08/01/2013   Procedure: TOTAL RIGHT HIP ARTHROPLASTY ANTERIOR APPROACH;  Surgeon: Shelda Pal, MD;  Location: [facility];  Service: Orthopedics;  Laterality: Right;   TOTAL HIP ARTHROPLASTY Left 10/15/2015   Procedure: LEFT TOTAL HIP ARTHROPLASTY ANTERIOR APPROACH;  Surgeon: Durene Romans, MD;  Location: [facility];  Service: Orthopedics;  Laterality: Left;   WRIST FRACTURE SURGERY        Family History  Problem Relation Age of Onset   Heart attack Father      Social History   Socioeconomic History   Marital status: Married    Spouse name: Not on file   Number of children: Not on file   Years of education: Not on file   Highest education level: Not on file  Occupational History   Not on file  Tobacco Use   Smoking status: Never   Smokeless tobacco: Never  Substance and Sexual Activity   Alcohol use: Not Currently    Comment: OCCASIONAL WINE   Drug use: No   Sexual activity: Not on file  Other Topics Concern   Not on file  Social History Narrative   Not on file   Social Determinants of Health   Financial Resource Strain: Not on file  Food Insecurity: Not on file  Transportation Needs: Not on file  Physical Activity: Not on file  Stress: Not on file  Social Connections: Not on file  Intimate Partner Violence: Not on file     BP 124/80   Pulse 80   Ht 5\' 4"  (1.626 m)   Wt 118 lb (53.5 kg)   SpO2 100%   BMI 20.25 kg/m   Physical Exam:  Well appearing NAD HEENT: Unremarkable Neck:  No JVD, no thyromegally Lymphatics:  No adenopathy Back:  No CVA tenderness Lungs:  Clear HEART:  Regular rate rhythm, no murmurs, no rubs, no clicks Abd:  soft, positive bowel sounds, no organomegally, no rebound, no guarding Ext:  2 plus pulses, no edema, no cyanosis, no clubbing Skin:  No rashes no nodules Neuro:  CN II through XII intact, motor grossly intact  EKG - nsr with PAC's  DEVICE  Normal device function.  See PaceArt for details.   Assess/Plan: 1. Sinus node dysfunction - she is asymptomatic, s/p PPM insertion. She is pacing 34% 2. PPM -her St. Jude DDD PM is working normally. We will recheck in several months   Carleene Overlie Lashawnta Burgert,MD

## 2022-04-23 ENCOUNTER — Ambulatory Visit (INDEPENDENT_AMBULATORY_CARE_PROVIDER_SITE_OTHER): Payer: Medicare Other

## 2022-04-23 DIAGNOSIS — I442 Atrioventricular block, complete: Secondary | ICD-10-CM

## 2022-04-23 LAB — CUP PACEART REMOTE DEVICE CHECK
Battery Remaining Longevity: 76 mo
Battery Remaining Percentage: 65 %
Battery Voltage: 3.01 V
Brady Statistic AP VP Percent: 1 %
Brady Statistic AP VS Percent: 22 %
Brady Statistic AS VP Percent: 1 %
Brady Statistic AS VS Percent: 78 %
Brady Statistic RA Percent Paced: 22 %
Brady Statistic RV Percent Paced: 1 %
Date Time Interrogation Session: 20231221020013
Implantable Lead Connection Status: 753985
Implantable Lead Connection Status: 753985
Implantable Lead Implant Date: 20190622
Implantable Lead Implant Date: 20190622
Implantable Lead Location: 753859
Implantable Lead Location: 753860
Implantable Pulse Generator Implant Date: 20190622
Lead Channel Impedance Value: 380 Ohm
Lead Channel Impedance Value: 430 Ohm
Lead Channel Pacing Threshold Amplitude: 0.5 V
Lead Channel Pacing Threshold Amplitude: 1.25 V
Lead Channel Pacing Threshold Pulse Width: 0.5 ms
Lead Channel Pacing Threshold Pulse Width: 0.5 ms
Lead Channel Sensing Intrinsic Amplitude: 2.5 mV
Lead Channel Sensing Intrinsic Amplitude: 9 mV
Lead Channel Setting Pacing Amplitude: 2 V
Lead Channel Setting Pacing Amplitude: 2.5 V
Lead Channel Setting Pacing Pulse Width: 0.5 ms
Lead Channel Setting Sensing Sensitivity: 2 mV
Pulse Gen Model: 2272
Pulse Gen Serial Number: 9032820

## 2022-05-15 NOTE — Progress Notes (Signed)
Remote pacemaker transmission.   

## 2022-07-23 ENCOUNTER — Ambulatory Visit (INDEPENDENT_AMBULATORY_CARE_PROVIDER_SITE_OTHER): Payer: Medicare Other

## 2022-07-23 DIAGNOSIS — I442 Atrioventricular block, complete: Secondary | ICD-10-CM

## 2022-07-23 LAB — CUP PACEART REMOTE DEVICE CHECK
Battery Remaining Longevity: 74 mo
Battery Remaining Percentage: 63 %
Battery Voltage: 3.01 V
Brady Statistic AP VP Percent: 1 %
Brady Statistic AP VS Percent: 25 %
Brady Statistic AS VP Percent: 1 %
Brady Statistic AS VS Percent: 75 %
Brady Statistic RA Percent Paced: 25 %
Brady Statistic RV Percent Paced: 1 %
Date Time Interrogation Session: 20240321020023
Implantable Lead Connection Status: 753985
Implantable Lead Connection Status: 753985
Implantable Lead Implant Date: 20190622
Implantable Lead Implant Date: 20190622
Implantable Lead Location: 753859
Implantable Lead Location: 753860
Implantable Pulse Generator Implant Date: 20190622
Lead Channel Impedance Value: 390 Ohm
Lead Channel Impedance Value: 450 Ohm
Lead Channel Pacing Threshold Amplitude: 0.5 V
Lead Channel Pacing Threshold Amplitude: 1.25 V
Lead Channel Pacing Threshold Pulse Width: 0.5 ms
Lead Channel Pacing Threshold Pulse Width: 0.5 ms
Lead Channel Sensing Intrinsic Amplitude: 10.4 mV
Lead Channel Sensing Intrinsic Amplitude: 2.5 mV
Lead Channel Setting Pacing Amplitude: 2 V
Lead Channel Setting Pacing Amplitude: 2.5 V
Lead Channel Setting Pacing Pulse Width: 0.5 ms
Lead Channel Setting Sensing Sensitivity: 2 mV
Pulse Gen Model: 2272
Pulse Gen Serial Number: 9032820

## 2022-08-26 NOTE — Progress Notes (Signed)
Remote pacemaker transmission.   

## 2022-10-05 ENCOUNTER — Emergency Department (HOSPITAL_COMMUNITY)
Admission: EM | Admit: 2022-10-05 | Discharge: 2022-10-05 | Disposition: A | Discharge status: Home / Self Care | Payer: Medicare Other | Attending: Emergency Medicine | Admitting: Emergency Medicine

## 2022-10-05 ENCOUNTER — Other Ambulatory Visit: Payer: Self-pay

## 2022-10-05 ENCOUNTER — Encounter (HOSPITAL_COMMUNITY): Payer: Self-pay | Admitting: *Deleted

## 2022-10-05 DIAGNOSIS — R300 Dysuria: Secondary | ICD-10-CM | POA: Diagnosis present

## 2022-10-05 DIAGNOSIS — N3001 Acute cystitis with hematuria: Secondary | ICD-10-CM | POA: Insufficient documentation

## 2022-10-05 DIAGNOSIS — Z950 Presence of cardiac pacemaker: Secondary | ICD-10-CM | POA: Diagnosis not present

## 2022-10-05 DIAGNOSIS — R200 Anesthesia of skin: Secondary | ICD-10-CM | POA: Insufficient documentation

## 2022-10-05 LAB — URINALYSIS, ROUTINE W REFLEX MICROSCOPIC
Bacteria, UA: NONE SEEN
Bilirubin Urine: NEGATIVE
Glucose, UA: NEGATIVE mg/dL
Ketones, ur: NEGATIVE mg/dL
Nitrite: NEGATIVE
Protein, ur: NEGATIVE mg/dL
Specific Gravity, Urine: 1.02 (ref 1.005–1.030)
pH: 5 (ref 5.0–8.0)

## 2022-10-05 LAB — BASIC METABOLIC PANEL
Anion gap: 6 (ref 5–15)
BUN: 14 mg/dL (ref 8–23)
CO2: 27 mmol/L (ref 22–32)
Calcium: 8.7 mg/dL — ABNORMAL LOW (ref 8.9–10.3)
Chloride: 103 mmol/L (ref 98–111)
Creatinine, Ser: 0.93 mg/dL (ref 0.44–1.00)
GFR, Estimated: 56 mL/min — ABNORMAL LOW (ref >60)
Glucose, Bld: 92 mg/dL (ref 70–99)
Potassium: 3.8 mmol/L (ref 3.5–5.1)
Sodium: 136 mmol/L (ref 135–145)

## 2022-10-05 LAB — CBC WITH DIFFERENTIAL/PLATELET
Abs Immature Granulocytes: 0.01 10*3/uL (ref 0.00–0.07)
Basophils Absolute: 0.1 10*3/uL (ref 0.0–0.1)
Basophils Relative: 1 %
Eosinophils Absolute: 0.1 10*3/uL (ref 0.0–0.5)
Eosinophils Relative: 1 %
HCT: 41 % (ref 36.0–46.0)
Hemoglobin: 13.4 g/dL (ref 12.0–15.0)
Immature Granulocytes: 0 %
Lymphocytes Relative: 47 %
Lymphs Abs: 2.5 10*3/uL (ref 0.7–4.0)
MCH: 31.2 pg (ref 26.0–34.0)
MCHC: 32.7 g/dL (ref 30.0–36.0)
MCV: 95.3 fL (ref 80.0–100.0)
Monocytes Absolute: 0.6 10*3/uL (ref 0.1–1.0)
Monocytes Relative: 12 %
Neutro Abs: 2.1 10*3/uL (ref 1.7–7.7)
Neutrophils Relative %: 39 %
Platelets: 126 10*3/uL — ABNORMAL LOW (ref 150–400)
RBC: 4.3 MIL/uL (ref 3.87–5.11)
RDW: 12 % (ref 11.5–15.5)
WBC: 5.4 10*3/uL (ref 4.0–10.5)
nRBC: 0 % (ref 0.0–0.2)

## 2022-10-05 MED ORDER — CIPROFLOXACIN HCL 250 MG PO TABS
250.0000 mg | ORAL_TABLET | Freq: Once | ORAL | Status: AC
  Administered 2022-10-05: 250 mg via ORAL
  Filled 2022-10-05: qty 1

## 2022-10-05 MED ORDER — CIPROFLOXACIN HCL 250 MG PO TABS: 250.0000 mg | ORAL_TABLET | Freq: Two times a day (BID) | ORAL | 0 refills | Status: AC

## 2022-10-05 NOTE — ED Triage Notes (Signed)
Pt c/o pain and burning with urination; pt completed an antibiotic x one week ago for a UTI; pt states she feels weak and has had numbness and tingling to right hand

## 2022-10-05 NOTE — ED Provider Notes (Signed)
[place] EMERGENCY DEPARTMENT AT [facility] Provider Note   CSN: [id number] Arrival date & time: 10/05/22  4098     History  Chief Complaint  Patient presents with   Dysuria    [name] [name] is a 89 y.o. female with history including pacemaker secondary to complete heart block, glaucoma, arthritis and osteoporosis, just completed a course of cephalexin 1 week ago for urinary tract infection, presenting for evaluation of return of burning pain with urination and increased urinary frequency.  She denies seeing blood in her urine.  She also reports intermittent episodes of tingling/numbness in her fingertips of her right hand which has been present for several weeks.  She denies weakness in her arms or hands.  Denies history of neck pain or injury.  No headache.  Reports she is generally fairly active, lives at home alone but with supportive family and home health, actively gardens.  She denies fevers or chills, nausea or vomiting, abdominal pain.  She maintains a good appetite and states she drinks plenty of water to avoid dehydration.  The history is provided by the patient.       Home Medications Prior to Admission medications   Medication Sig Start Date End Date Taking? Authorizing Provider  ciprofloxacin (CIPRO) 250 MG tablet Take 1 tablet (250 mg total) by mouth every 12 (twelve) hours for 5 days. 10/05/22 10/10/22 Yes Jazlin Tapscott, Raynelle Fanning, PA-C      Allergies    Sulfonamide derivatives, Alphagan [brimonidine], Cosopt [dorzolamide hcl-timolol mal], Lumigan [bimatoprost], Pilocarpine, and Timolol    Review of Systems   Review of Systems  Constitutional:  Negative for chills and fever.  HENT:  Negative for congestion and sore throat.   Eyes: Negative.   Respiratory:  Negative for chest tightness and shortness of breath.   Cardiovascular:  Negative for chest pain.  Gastrointestinal:  Negative for abdominal pain and nausea.  Genitourinary:  Positive for dysuria and urgency.   Musculoskeletal:  Negative for arthralgias, joint swelling and neck pain.  Skin: Negative.  Negative for rash and wound.  Neurological:  Positive for numbness. Negative for dizziness, weakness, light-headedness and headaches.  Psychiatric/Behavioral: Negative.    All other systems reviewed and are negative.   Physical Exam Updated Vital Signs BP (!) 147/84   Pulse 61   Temp 97.7 F (36.5 C) (Oral)   Resp 14   Ht 5\' 4"  (1.626 m)   Wt 52.2 kg   SpO2 97%   BMI 19.74 kg/m  Physical Exam Vitals and nursing note reviewed.  Constitutional:      Appearance: She is well-developed.  HENT:     Head: Normocephalic and atraumatic.     Mouth/Throat:     Mouth: Mucous membranes are moist.  Eyes:     Conjunctiva/sclera: Conjunctivae normal.  Cardiovascular:     Rate and Rhythm: Normal rate and regular rhythm.     Pulses:          Radial pulses are 2+ on the right side and 2+ on the left side.     Heart sounds: Normal heart sounds.  Pulmonary:     Effort: Pulmonary effort is normal.     Breath sounds: Normal breath sounds. No wheezing.  Abdominal:     General: Bowel sounds are normal.     Palpations: Abdomen is soft.     Tenderness: There is no abdominal tenderness. There is no guarding or rebound.  Musculoskeletal:        General: No swelling or  tenderness. Normal range of motion.     Cervical back: Normal range of motion.  Skin:    General: Skin is warm and dry.  Neurological:     Mental Status: She is alert and oriented to person, place, and time.     Cranial Nerves: No cranial nerve deficit.     Sensory: No sensory deficit.     Motor: Motor function is intact.     Deep Tendon Reflexes:     Reflex Scores:      Bicep reflexes are 2+ on the right side and 2+ on the left side.    Comments: Full grip strength.   Tingling described in finger tips. Negative tinels.       ED Results / Procedures / Treatments   Labs (all labs ordered are listed, but only abnormal results are  displayed) Labs Reviewed  URINALYSIS, ROUTINE W REFLEX MICROSCOPIC - Abnormal; Notable for the following components:      Result Value   Hgb urine dipstick SMALL (*)    Leukocytes,Ua LARGE (*)    All other components within normal limits  CBC WITH DIFFERENTIAL/PLATELET - Abnormal; Notable for the following components:   Platelets 126 (*)    All other components within normal limits  BASIC METABOLIC PANEL - Abnormal; Notable for the following components:   Calcium 8.7 (*)    GFR, Estimated 56 (*)    All other components within normal limits  URINE CULTURE    EKG None  Radiology No results found.  Procedures Procedures    Medications Ordered in ED Medications  ciprofloxacin (CIPRO) tablet 250 mg (250 mg Oral Given 10/05/22 1307)    ED Course/ Medical Decision Making/ A&P                             Medical Decision Making Pt presenting with dysuria despite completion of keflex prescription last week.  Afebrile, no back or flank pain.  Also no nausea or vomiting or other systemic complaints.  She does endorse generalized fatigue for the past week.  Her vital signs here are reassuring, she is afebrile.  Given her symptoms and a urinalysis showing large amount of leukocytes and small amount of hemoglobin, she will be switched to Cipro since she is sulfa allergic.  Encouraged increased fluid intake, close follow-up for any worsening symptoms.  She has no neuro deficits on today's exam.  She endorses intermittent tingling in her right fingertips, she has no weakness on exam, suspect this may be a neuropathy.  Would recommend follow-up with primary provider if symptoms persist or worsen.  No evidence this represents a stroke or TIA.  She has no weakness on exam.  Amount and/or Complexity of Data Reviewed Labs: ordered.    Details: Per above  Risk Prescription drug management.           Final Clinical Impression(s) / ED Diagnoses Final diagnoses:  Acute cystitis with  hematuria    Rx / DC Orders ED Discharge Orders          Ordered    ciprofloxacin (CIPRO) 250 MG tablet  Every 12 hours        10/05/22 1256              Burgess Amor, PA-C 10/05/22 1419    Bethann Berkshire, MD 10/08/22 [phone]

## 2022-10-05 NOTE — Discharge Instructions (Addendum)
You are being switched to a different antibiotic which hopefully will resolve your urinary symptoms.  Take the full course of this medication, 1 more dose today before bedtime.  Make sure you are drinking plenty of fluids.  Plan follow-up care if your symptoms are not resolving with this treatment plan.  A urine culture has been ordered, this will help [place] to know if we have chosen the right antibiotic, especially if your symptoms persist despite this new treatment plan.

## 2022-10-06 LAB — URINE CULTURE: Culture: <10000 — AB

## 2022-10-22 ENCOUNTER — Ambulatory Visit (INDEPENDENT_AMBULATORY_CARE_PROVIDER_SITE_OTHER): Payer: Medicare Other

## 2022-10-22 DIAGNOSIS — I442 Atrioventricular block, complete: Secondary | ICD-10-CM

## 2022-10-22 LAB — CUP PACEART REMOTE DEVICE CHECK
Battery Remaining Longevity: 71 mo
Battery Remaining Percentage: 61 %
Battery Voltage: 3.01 V
Brady Statistic AP VP Percent: 1 %
Brady Statistic AP VS Percent: 30 %
Brady Statistic AS VP Percent: 1 %
Brady Statistic AS VS Percent: 70 %
Brady Statistic RA Percent Paced: 29 %
Brady Statistic RV Percent Paced: 1 %
Date Time Interrogation Session: 20240620020013
Implantable Lead Connection Status: 753985
Implantable Lead Connection Status: 753985
Implantable Lead Implant Date: 20190622
Implantable Lead Implant Date: 20190622
Implantable Lead Location: 753859
Implantable Lead Location: 753860
Implantable Pulse Generator Implant Date: 20190622
Lead Channel Impedance Value: 380 Ohm
Lead Channel Impedance Value: 440 Ohm
Lead Channel Pacing Threshold Amplitude: 0.5 V
Lead Channel Pacing Threshold Amplitude: 1.25 V
Lead Channel Pacing Threshold Pulse Width: 0.5 ms
Lead Channel Pacing Threshold Pulse Width: 0.5 ms
Lead Channel Sensing Intrinsic Amplitude: 11.1 mV
Lead Channel Sensing Intrinsic Amplitude: 2.7 mV
Lead Channel Setting Pacing Amplitude: 2 V
Lead Channel Setting Pacing Amplitude: 2.5 V
Lead Channel Setting Pacing Pulse Width: 0.5 ms
Lead Channel Setting Sensing Sensitivity: 2 mV
Pulse Gen Model: 2272
Pulse Gen Serial Number: 9032820

## 2022-11-04 ENCOUNTER — Encounter: Payer: Self-pay | Admitting: Internal Medicine

## 2022-11-04 ENCOUNTER — Ambulatory Visit: Payer: Medicare Other | Attending: Internal Medicine | Admitting: Internal Medicine

## 2022-11-04 VITALS — BP 132/88 | HR 61 | Ht 65.0 in | Wt 115.2 lb

## 2022-11-04 DIAGNOSIS — I442 Atrioventricular block, complete: Secondary | ICD-10-CM | POA: Insufficient documentation

## 2022-11-04 LAB — CUP PACEART INCLINIC DEVICE CHECK
Date Time Interrogation Session: 20240703134836
Implantable Lead Connection Status: 753985
Implantable Lead Connection Status: 753985
Implantable Lead Implant Date: 20190622
Implantable Lead Implant Date: 20190622
Implantable Lead Location: 753859
Implantable Lead Location: 753860
Implantable Pulse Generator Implant Date: 20190622
Pulse Gen Model: 2272
Pulse Gen Serial Number: 9032820

## 2022-11-04 NOTE — Progress Notes (Signed)
HPI [name] [name] returns today for followup. 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 She has done well in the interim. She still tends a garden.  No chest pain or sob. No syncope.No vision difficulties despite having glaucoma and not being on any meds. She c/o feeling weak in the morning getting better thru the day. No note that her bp is low. She has had a UTI and been treated.  Allergies  Allergen Reactions   Sulfonamide Derivatives Itching and Swelling   Alphagan [Brimonidine] Itching and Rash   Cosopt [Dorzolamide Hcl-Timolol Mal] Itching, Swelling and Rash   Lumigan [Bimatoprost] Itching and Rash   Pilocarpine Itching and Rash   Timolol Itching and Rash     No current outpatient medications on file.   No current facility-administered medications for this visit.     Past Medical History:  Diagnosis Date   Arthritis    Complication of anesthesia    pt states she had trouble waking up   Frequency of urination    Glaucoma    Osteoporosis    Pacemaker     ROS:   All systems reviewed and negative except as noted in the HPI.   Past Surgical History:  Procedure Laterality Date   BACK SURGERY  1967 / 1970'S   X 2   CHOLECYSTECTOMY  1980'S   EYE SURGERY     cataract and lasik for glaucoma   PACEMAKER IMPLANT N/A 10/23/2017   Procedure: PACEMAKER IMPLANT;  Surgeon: Hillis Range, MD;  Location: [facility];  Service: Cardiovascular;  Laterality: N/A;   TEMPORARY PACEMAKER N/A 10/23/2017   Procedure: TEMPORARY PACEMAKER;  Surgeon: Yvonne Kendall, MD;  Location: [facility];  Service: Cardiovascular;  Laterality: N/A;   TOTAL HIP ARTHROPLASTY Right 08/01/2013   Procedure: TOTAL RIGHT HIP ARTHROPLASTY ANTERIOR APPROACH;  Surgeon: Shelda Pal, MD;  Location: [facility];  Service: Orthopedics;  Laterality: Right;   TOTAL HIP ARTHROPLASTY Left 10/15/2015   Procedure: LEFT TOTAL HIP  ARTHROPLASTY ANTERIOR APPROACH;  Surgeon: Durene Romans, MD;  Location: [facility];  Service: Orthopedics;  Laterality: Left;   WRIST FRACTURE SURGERY       Family History  Problem Relation Age of Onset   Heart attack Father      Social History   Socioeconomic History   Marital status: Married    Spouse name: Not on file   Number of children: Not on file   Years of education: Not on file   Highest education level: Not on file  Occupational History   Not on file  Tobacco Use   Smoking status: Never   Smokeless tobacco: Never  Vaping Use   Vaping Use: Never used  Substance and Sexual Activity   Alcohol use: Not Currently    Comment: OCCASIONAL WINE   Drug use: No   Sexual activity: Not on file  Other Topics Concern   Not on file  Social History Narrative   Not on file   Social Determinants of Health   Financial Resource Strain: Not on file  Food Insecurity: Not on file  Transportation Needs: Not on file  Physical Activity: Not on file  Stress: Not on file  Social Connections: Not on file  Intimate Partner Violence: Not on file     BP 132/88 (BP Location: Left Arm, Patient Position: Sitting, Cuff Size: Normal)   Pulse 61  Ht 5\' 5"  (1.651 m)   Wt 115 lb 3.2 oz (52.3 kg)   SpO2 (!) 89%   BMI 19.17 kg/m   Physical Exam:  Well appearing elderly woman, looking younger than her stated age, NAD HEENT: Unremarkable Neck:  No JVD, no thyromegally Lymphatics:  No adenopathy Back:  No CVA tenderness Lungs:  Clear with no wheezes HEART:  Regular rate rhythm, no murmurs, no rubs, no clicks Abd:  soft, positive bowel sounds, no organomegally, no rebound, no guarding Ext:  2 plus pulses, no edema, no cyanosis, no clubbing Skin:  No rashes no nodules Neuro:  CN II through XII intact, motor grossly intact  EKG - NSR with atrial pacing  DEVICE  Normal device function.  See PaceArt for details.   Assess/Plan:  1. Sinus node dysfunction - she is asymptomatic, s/p PPM  insertion. She is pacing 34% 2. PPM -her [employer] DDD PM is working normally. We will recheck in several months   [name] Gowda [name],MD

## 2022-11-04 NOTE — Patient Instructions (Addendum)
Medication Instructions:   Continue all current medications.   Labwork:  none  Testing/Procedures:  none  Follow-Up:  1 year   Any Other Special Instructions Will Be Listed Below (If Applicable).  Please keep BP log x 3 weeks - please notify office if BP below 100 in the am's   If you need a refill on your cardiac medications before your next appointment, please call your pharmacy.

## 2022-11-11 NOTE — Progress Notes (Signed)
Remote pacemaker transmission.   

## 2023-01-07 ENCOUNTER — Emergency Department (HOSPITAL_COMMUNITY)
Admission: EM | Admit: 2023-01-07 | Discharge: 2023-01-07 | Disposition: A | Discharge status: Home / Self Care | Payer: Medicare Other | Attending: Emergency Medicine | Admitting: Emergency Medicine

## 2023-01-07 ENCOUNTER — Encounter (HOSPITAL_COMMUNITY): Payer: Self-pay | Admitting: Emergency Medicine

## 2023-01-07 ENCOUNTER — Other Ambulatory Visit: Payer: Self-pay

## 2023-01-07 ENCOUNTER — Emergency Department (HOSPITAL_COMMUNITY): Payer: Medicare Other

## 2023-01-07 DIAGNOSIS — M79652 Pain in left thigh: Secondary | ICD-10-CM | POA: Diagnosis present

## 2023-01-07 MED ORDER — METHOCARBAMOL 500 MG PO TABS: 500.0000 mg | ORAL_TABLET | Freq: Two times a day (BID) | ORAL | 0 refills | Status: DC | PRN

## 2023-01-07 NOTE — ED Provider Notes (Signed)
[place] EMERGENCY DEPARTMENT AT [facility] Provider Note   CSN: [id number] Arrival date & time: 01/07/23  0747     History  Chief Complaint  Patient presents with   Leg Pain    [name] [name] is a 89 y.o. female.   Leg Pain  Patient is a 89 year old female, she has a history of arthritis, she has had both of her hips replaced and she has a pacemaker for complete heart block.  She is otherwise very healthy at the age of 58.  She presents with a complaint of left-sided thigh pain on the anterior thigh in the middle of the thigh which occurred while she was sleeping last night.  It seems to be intermittent comes on for several seconds, it is an intense stabbing pain and then it goes away.  She has no shortness of breath no swelling no numbness no weakness and she is been able to ambulate.  She cannot reproduce the pain but states that this comes on spontaneously.  It happened twice while she was sitting in the waiting room lasting only several seconds  The patient reports that she gardens, that is the extent of her activity.  She has not had any injuries or falls    Home Medications Prior to Admission medications   Medication Sig Start Date End Date Taking? Authorizing Provider  methocarbamol (ROBAXIN) 500 MG tablet Take 1 tablet (500 mg total) by mouth 2 (two) times daily as needed for muscle spasms. 01/07/23  Yes Eber Hong, MD      Allergies    Sulfonamide derivatives, Alphagan [brimonidine], Cosopt [dorzolamide hcl-timolol mal], Lumigan [bimatoprost], Pilocarpine, and Timolol    Review of Systems   Review of Systems  All other systems reviewed and are negative.   Physical Exam Updated Vital Signs BP (!) 155/92   Pulse 80   Temp 98 F (36.7 C) (Oral)   Resp 18   SpO2 98%  Physical Exam Vitals and nursing note reviewed.  Constitutional:      General: She is not in acute distress.    Appearance: She is well-developed.  HENT:     Head: Normocephalic  and atraumatic.     Mouth/Throat:     Pharynx: No oropharyngeal exudate.  Eyes:     General: No scleral icterus.       Right eye: No discharge.        Left eye: No discharge.     Conjunctiva/sclera: Conjunctivae normal.     Pupils: Pupils are equal, round, and reactive to light.  Neck:     Thyroid: No thyromegaly.     Vascular: No JVD.  Cardiovascular:     Rate and Rhythm: Normal rate and regular rhythm.     Heart sounds: Normal heart sounds. No murmur heard.    No friction rub. No gallop.     Comments: Pacemaker present, good pulses at the feet and the wrists Pulmonary:     Effort: Pulmonary effort is normal. No respiratory distress.     Breath sounds: Normal breath sounds. No wheezing or rales.  Abdominal:     General: Bowel sounds are normal. There is no distension.     Palpations: Abdomen is soft. There is no mass.     Tenderness: There is no abdominal tenderness.  Musculoskeletal:        General: No tenderness. Normal range of motion.     Cervical back: Normal range of motion and neck supple.     Comments:  Full range of motion of the bilateral hips knees and ankles, there is no tenderness or asymmetry of the legs, the mid thigh on the left is not tender, normal sensation, she can straight leg raise bilaterally and extend at the knee without any difficulty against resistance.  Lymphadenopathy:     Cervical: No cervical adenopathy.  Skin:    General: Skin is warm and dry.     Findings: No erythema or rash.  Neurological:     Mental Status: She is alert.     Coordination: Coordination normal.  Psychiatric:        Behavior: Behavior normal.     ED Results / Procedures / Treatments   Labs (all labs ordered are listed, but only abnormal results are displayed) Labs Reviewed - No data to display  EKG None  Radiology DG Femur Min 2 Views Left  Result Date: 01/07/2023 CLINICAL DATA:  Anterior thigh pain.  No reported history of trauma EXAM: LEFT FEMUR 2 VIEWS COMPARISON:   Hip x-ray 10/15/2015 FINDINGS: Osteopenia. Evidence of a hip arthroplasty with a screw fixated acetabular cup. Press-Fit femoral component. No hardware failure. No fracture or dislocation. Of the knee joint there is chondrocalcinosis and osteophyte formation. Vascular calcifications along the visualized low pelvis. IMPRESSION: Left hip arthroplasty. Osteopenia. Degenerative changes and chondrocalcinosis Electronically Signed   By: Karen Kays M.D.   On: 01/07/2023 10:09    Procedures Procedures    Medications Ordered in ED Medications - No data to display  ED Course/ Medical Decision Making/ A&P                                 Medical Decision Making Amount and/or Complexity of Data Reviewed Radiology: ordered.  Risk Prescription drug management.    This patient presents to the ED for concern of pain in the left thigh differential diagnosis includes muscle spasm, underlying injury or bony abnormality, less likely to be DVT given no swelling in the pain that last several seconds and is sharp and stabbing nature    Additional history obtained:  Additional history obtained from medical record External records from outside source obtained and reviewed including has been seen in the emergency department in the office with cardiology most recently in July for complete heart block, no other major issues, no recent admissions to the hospital, successfully living independently in the outpatient setting   Lab Tests:  I Ordered, and personally interpreted labs.  The pertinent results include: Not indicated   Imaging Studies ordered:  I ordered imaging studies including left femur x-ray I independently visualized and interpreted imaging which showed no acute abnormalities of the bone I agree with the radiologist interpretation   Medicines ordered and prescription drug management:  I ordered medication including methocarbamol for home I have reviewed the patients home medicines  and have made adjustments as needed   Problem List / ED Course:  Patient informed of results, imaging negative, vital signs reassuring, pain is not constant, seems to be muscle spasm   Social Determinants of Health:  Elderly  I have discussed with the patient at the bedside the results, and the meaning of these results.  They have expressed her understanding to the need for follow-up with primary care physician           Final Clinical Impression(s) / ED Diagnoses Final diagnoses:  Left thigh pain    Rx / DC Orders ED Discharge Orders  Ordered    methocarbamol (ROBAXIN) 500 MG tablet  2 times daily PRN        01/07/23 1054              Eber Hong, MD 01/07/23 1059

## 2023-01-07 NOTE — ED Triage Notes (Signed)
Pt reports left anterior thigh pain that started this morning. Pt e reports the pain "come and goes." Unsure of what makes the pain worse.

## 2023-01-07 NOTE — Discharge Instructions (Signed)
Your x-ray was normal, you do have some arthritis, I do want you to follow-up with your family doctor this week for recheck if you are still having the pain that comes and goes.  If the pain becomes more prolonged severe or if there is any worsening symptoms such as numbness or weakness return to the ER immediately.  I have prescribed a medication called Robaxin, this is a muscle relaxer, 1 tablet every 12 hours as needed for muscle spasms but be aware that this is sedating and may make you sleepy.  You can take this in addition to taking some Tylenol or ibuprofen as needed.  See your doctor in 3 days

## 2023-01-21 ENCOUNTER — Ambulatory Visit (INDEPENDENT_AMBULATORY_CARE_PROVIDER_SITE_OTHER): Payer: Medicare Other

## 2023-01-21 DIAGNOSIS — I442 Atrioventricular block, complete: Secondary | ICD-10-CM

## 2023-01-21 LAB — CUP PACEART REMOTE DEVICE CHECK
Battery Remaining Longevity: 67 mo
Battery Remaining Percentage: 58 %
Battery Voltage: 2.99 V
Brady Statistic AP VP Percent: 1 %
Brady Statistic AP VS Percent: 38 %
Brady Statistic AS VP Percent: 1 %
Brady Statistic AS VS Percent: 62 %
Brady Statistic RA Percent Paced: 37 %
Brady Statistic RV Percent Paced: 1 %
Date Time Interrogation Session: 20240919020016
Implantable Lead Connection Status: 753985
Implantable Lead Connection Status: 753985
Implantable Lead Implant Date: 20190622
Implantable Lead Implant Date: 20190622
Implantable Lead Location: 753859
Implantable Lead Location: 753860
Implantable Pulse Generator Implant Date: 20190622
Lead Channel Impedance Value: 380 Ohm
Lead Channel Impedance Value: 440 Ohm
Lead Channel Pacing Threshold Amplitude: 0.5 V
Lead Channel Pacing Threshold Amplitude: 1.25 V
Lead Channel Pacing Threshold Pulse Width: 0.5 ms
Lead Channel Pacing Threshold Pulse Width: 0.5 ms
Lead Channel Sensing Intrinsic Amplitude: 2.6 mV
Lead Channel Sensing Intrinsic Amplitude: 8.1 mV
Lead Channel Setting Pacing Amplitude: 2 V
Lead Channel Setting Pacing Amplitude: 2.5 V
Lead Channel Setting Pacing Pulse Width: 0.5 ms
Lead Channel Setting Sensing Sensitivity: 2 mV
Pulse Gen Model: 2272
Pulse Gen Serial Number: 9032820

## 2023-02-02 NOTE — Progress Notes (Signed)
Remote pacemaker transmission.   

## 2023-04-22 ENCOUNTER — Ambulatory Visit: Payer: Medicare Other

## 2023-04-22 DIAGNOSIS — I442 Atrioventricular block, complete: Secondary | ICD-10-CM

## 2023-04-22 LAB — CUP PACEART REMOTE DEVICE CHECK
Battery Remaining Longevity: 65 mo
Battery Remaining Percentage: 56 %
Battery Voltage: 3.01 V
Brady Statistic AP VP Percent: 1 %
Brady Statistic AP VS Percent: 37 %
Brady Statistic AS VP Percent: 1 %
Brady Statistic AS VS Percent: 63 %
Brady Statistic RA Percent Paced: 36 %
Brady Statistic RV Percent Paced: 1 %
Date Time Interrogation Session: 20241219020014
Implantable Lead Connection Status: 753985
Implantable Lead Connection Status: 753985
Implantable Lead Implant Date: 20190622
Implantable Lead Implant Date: 20190622
Implantable Lead Location: 753859
Implantable Lead Location: 753860
Implantable Pulse Generator Implant Date: 20190622
Lead Channel Impedance Value: 350 Ohm
Lead Channel Impedance Value: 430 Ohm
Lead Channel Pacing Threshold Amplitude: 0.5 V
Lead Channel Pacing Threshold Amplitude: 1.25 V
Lead Channel Pacing Threshold Pulse Width: 0.5 ms
Lead Channel Pacing Threshold Pulse Width: 0.5 ms
Lead Channel Sensing Intrinsic Amplitude: 2.1 mV
Lead Channel Sensing Intrinsic Amplitude: 6.6 mV
Lead Channel Setting Pacing Amplitude: 2 V
Lead Channel Setting Pacing Amplitude: 2.5 V
Lead Channel Setting Pacing Pulse Width: 0.5 ms
Lead Channel Setting Sensing Sensitivity: 2 mV
Pulse Gen Model: 2272
Pulse Gen Serial Number: 9032820

## 2023-05-17 ENCOUNTER — Ambulatory Visit: Payer: Medicare Other | Admitting: Urology

## 2023-05-17 ENCOUNTER — Ambulatory Visit (HOSPITAL_COMMUNITY)
Admission: RE | Admit: 2023-05-17 | Discharge: 2023-05-17 | Disposition: A | Discharge status: Home / Self Care | Payer: Medicare Other | Source: Ambulatory Visit | Attending: Urology | Admitting: Urology

## 2023-05-17 VITALS — BP 121/69 | HR 87

## 2023-05-17 DIAGNOSIS — N200 Calculus of kidney: Secondary | ICD-10-CM | POA: Insufficient documentation

## 2023-05-17 DIAGNOSIS — N390 Urinary tract infection, site not specified: Secondary | ICD-10-CM

## 2023-05-17 LAB — BLADDER SCAN AMB NON-IMAGING: Scan Result: 3

## 2023-05-17 NOTE — Progress Notes (Signed)
 post void residual=3

## 2023-05-17 NOTE — Progress Notes (Signed)
 05/17/2023 2:59 PM   [name] [name] [birth year] [id number]  Referring provider: No referring provider defined for this encounter.  Difficulty urinating.    HPI: [name] [name] is a 89yo here for evaluation of difficulty urinating and dysuria. For the past several months she has noted worsening urinary urgency, frequency and feeling of incomplete emptying. She was diagnosed with a UTI and was started on cipro  250mg  BID. Her LUTS did not improve. UA today shows CaOx crystals. She has occasional dysuria. She has urinary hesitancy which is bothersome. No gross hematuria   PMH: Past Medical History:  Diagnosis Date   Arthritis    Complication of anesthesia    pt states she had trouble waking up   Frequency of urination    Glaucoma    Osteoporosis    Pacemaker     Surgical History: Past Surgical History:  Procedure Laterality Date   BACK SURGERY  1967 / 1970'S   X 2   CHOLECYSTECTOMY  1980'S   EYE SURGERY     cataract and lasik for glaucoma   PACEMAKER IMPLANT N/A 10/23/2017   Procedure: PACEMAKER IMPLANT;  Surgeon: Kelsie Agent, MD;  Location: [facility];  Service: Cardiovascular;  Laterality: N/A;   TEMPORARY PACEMAKER N/A 10/23/2017   Procedure: TEMPORARY PACEMAKER;  Surgeon: Mady Bruckner, MD;  Location: [facility];  Service: Cardiovascular;  Laterality: N/A;   TOTAL HIP ARTHROPLASTY Right 08/01/2013   Procedure: TOTAL RIGHT HIP ARTHROPLASTY ANTERIOR APPROACH;  Surgeon: Donnice JONETTA Car, MD;  Location: [facility];  Service: Orthopedics;  Laterality: Right;   TOTAL HIP ARTHROPLASTY Left 10/15/2015   Procedure: LEFT TOTAL HIP ARTHROPLASTY ANTERIOR APPROACH;  Surgeon: Donnice Car, MD;  Location: [facility];  Service: Orthopedics;  Laterality: Left;   WRIST FRACTURE SURGERY      Home Medications:  Allergies as of 05/17/2023       Reactions   Sulfonamide Derivatives Itching, Swelling   Alphagan [brimonidine] Itching, Rash   Cosopt [dorzolamide Hcl-timolol Mal] Itching,  Swelling, Rash   Lumigan [bimatoprost] Itching, Rash   Pilocarpine Itching, Rash   Timolol Itching, Rash        Medication List        Accurate as of May 17, 2023  2:59 PM. If you have any questions, ask your nurse or doctor.          methocarbamol  500 MG tablet Commonly known as: ROBAXIN  Take 1 tablet (500 mg total) by mouth 2 (two) times daily as needed for muscle spasms.        Allergies:  Allergies  Allergen Reactions   Sulfonamide Derivatives Itching and Swelling   Alphagan [Brimonidine] Itching and Rash   Cosopt [Dorzolamide Hcl-Timolol Mal] Itching, Swelling and Rash   Lumigan [Bimatoprost] Itching and Rash   Pilocarpine Itching and Rash   Timolol Itching and Rash    Family History: Family History  Problem Relation Age of Onset   Heart attack Father     Social History:  reports that she has never smoked. She has never used smokeless tobacco. She reports that she does not currently use alcohol. She reports that she does not use drugs.  ROS: All other review of systems were reviewed and are negative except what is noted above in HPI  Physical Exam: BP 121/69   Pulse 87   Constitutional:  Alert and oriented, No acute distress. HEENT: [place] AT, moist mucus membranes.  Trachea midline, no masses. Cardiovascular: No clubbing, cyanosis, or edema. Respiratory: Normal  respiratory effort, no increased work of breathing. GI: Abdomen is soft, nontender, nondistended, no abdominal masses GU: No CVA tenderness.  Lymph: No cervical or inguinal lymphadenopathy. Skin: No rashes, bruises or suspicious lesions. Neurologic: Grossly intact, no focal deficits, moving all 4 extremities. Psychiatric: Normal mood and affect.  Laboratory Data: Lab Results  Component Value Date   WBC 5.4 10/05/2022   HGB 13.4 10/05/2022   HCT 41.0 10/05/2022   MCV 95.3 10/05/2022   PLT 126 (L) 10/05/2022    Lab Results  Component Value Date   CREATININE 0.93 10/05/2022    No  results found for: PSA  No results found for: TESTOSTERONE  No results found for: HGBA1C  Urinalysis    Component Value Date/Time   COLORURINE YELLOW 10/05/2022 1035   APPEARANCEUR CLEAR 10/05/2022 1035   LABSPEC 1.020 10/05/2022 1035   PHURINE 5.0 10/05/2022 1035   GLUCOSEU NEGATIVE 10/05/2022 1035   HGBUR SMALL (A) 10/05/2022 1035   BILIRUBINUR NEGATIVE 10/05/2022 1035   KETONESUR NEGATIVE 10/05/2022 1035   PROTEINUR NEGATIVE 10/05/2022 1035   UROBILINOGEN 0.2 07/24/2013 1124   NITRITE NEGATIVE 10/05/2022 1035   LEUKOCYTESUR LARGE (A) 10/05/2022 1035    Lab Results  Component Value Date   BACTERIA NONE SEEN 10/05/2022    Pertinent Imaging: No results found for this or any previous visit.  No results found for this or any previous visit.  No results found for this or any previous visit.  No results found for this or any previous visit.  No results found for this or any previous visit.  No results found for this or any previous visit.  No results found for this or any previous visit.  No results found for this or any previous visit.   Assessment & Plan:    1. Recurrent UTI (Primary) -urine for culture - Urinalysis, Routine w reflex microscopic - BLADDER SCAN AMB NON-IMAGING  2. Nephrolithiasi -CT stone study, will call with results   No follow-ups on file.  Belvie Clara, MD  [facility] 

## 2023-05-18 LAB — URINALYSIS, ROUTINE W REFLEX MICROSCOPIC
Bilirubin, UA: NEGATIVE
Glucose, UA: NEGATIVE
Ketones, UA: NEGATIVE
Nitrite, UA: NEGATIVE
Protein,UA: NEGATIVE
RBC, UA: NEGATIVE
Specific Gravity, UA: 1.025 (ref 1.005–1.030)
Urobilinogen, Ur: 1 mg/dL (ref 0.2–1.0)
pH, UA: 6 (ref 5.0–7.5)

## 2023-05-18 LAB — MICROSCOPIC EXAMINATION: Bacteria, UA: NONE SEEN

## 2023-05-19 LAB — URINE CULTURE

## 2023-05-20 ENCOUNTER — Encounter: Payer: Self-pay | Admitting: Urology

## 2023-05-20 NOTE — Patient Instructions (Signed)

## 2023-05-21 ENCOUNTER — Ambulatory Visit: Payer: Medicare Other | Admitting: Urology

## 2023-05-21 VITALS — BP 134/55 | HR 78

## 2023-05-21 DIAGNOSIS — N390 Urinary tract infection, site not specified: Secondary | ICD-10-CM

## 2023-05-21 DIAGNOSIS — R300 Dysuria: Secondary | ICD-10-CM

## 2023-05-21 LAB — URINALYSIS, ROUTINE W REFLEX MICROSCOPIC
Bilirubin, UA: NEGATIVE
Glucose, UA: NEGATIVE
Nitrite, UA: NEGATIVE
Protein,UA: NEGATIVE
RBC, UA: NEGATIVE
Specific Gravity, UA: 1.025 (ref 1.005–1.030)
Urobilinogen, Ur: 0.2 mg/dL (ref 0.2–1.0)
pH, UA: 6 (ref 5.0–7.5)

## 2023-05-21 LAB — MICROSCOPIC EXAMINATION

## 2023-05-21 MED ORDER — CIPROFLOXACIN HCL 500 MG PO TABS
500.0000 mg | ORAL_TABLET | Freq: Once | ORAL | Status: AC
Start: 2023-05-21 — End: 2023-05-21
  Administered 2023-05-21: 500 mg via ORAL

## 2023-05-21 NOTE — Progress Notes (Signed)
   05/21/23  CC: dysuria   HPI:  Blood pressure (!) 134/55, pulse 78. NED. A&Ox3.   No respiratory distress   Abd soft, NT, ND Normal external genitalia with patent urethral meatus  Cystoscopy Procedure Note  Patient identification was confirmed, informed consent was obtained, and patient was prepped using Betadine solution.  Lidocaine jelly was administered per urethral meatus.    Procedure: - Flexible cystoscope introduced, without any difficulty.   - Thorough search of the bladder revealed:    normal urethral meatus    normal urothelium    no stones    no ulcers     no tumors    no urethral polyps    no trabeculation  - Ureteral orifices were normal in position and appearance.  Post-Procedure: - Patient tolerated the procedure well  Assessment/ Plan: Followup 3-6 months   No follow-ups on file.  Wilkie Aye, MD

## 2023-05-27 NOTE — Progress Notes (Signed)
Remote pacemaker transmission.   

## 2023-05-31 ENCOUNTER — Encounter: Payer: Self-pay | Admitting: Urology

## 2023-05-31 NOTE — Patient Instructions (Signed)

## 2023-06-16 ENCOUNTER — Encounter: Payer: Self-pay | Admitting: Urology

## 2023-07-22 ENCOUNTER — Ambulatory Visit: Payer: Medicare Other

## 2023-07-22 DIAGNOSIS — I442 Atrioventricular block, complete: Secondary | ICD-10-CM | POA: Diagnosis not present

## 2023-07-23 LAB — CUP PACEART REMOTE DEVICE CHECK
Battery Remaining Longevity: 63 mo
Battery Remaining Percentage: 54 %
Battery Voltage: 2.99 V
Brady Statistic AP VP Percent: 1 %
Brady Statistic AP VS Percent: 36 %
Brady Statistic AS VP Percent: 1 %
Brady Statistic AS VS Percent: 64 %
Brady Statistic RA Percent Paced: 35 %
Brady Statistic RV Percent Paced: 1 %
Date Time Interrogation Session: 20250320020013
Implantable Lead Connection Status: 753985
Implantable Lead Connection Status: 753985
Implantable Lead Implant Date: 20190622
Implantable Lead Implant Date: 20190622
Implantable Lead Location: 753859
Implantable Lead Location: 753860
Implantable Pulse Generator Implant Date: 20190622
Lead Channel Impedance Value: 390 Ohm
Lead Channel Impedance Value: 450 Ohm
Lead Channel Pacing Threshold Amplitude: 0.5 V
Lead Channel Pacing Threshold Amplitude: 1.25 V
Lead Channel Pacing Threshold Pulse Width: 0.5 ms
Lead Channel Pacing Threshold Pulse Width: 0.5 ms
Lead Channel Sensing Intrinsic Amplitude: 2.1 mV
Lead Channel Sensing Intrinsic Amplitude: 7.8 mV
Lead Channel Setting Pacing Amplitude: 2 V
Lead Channel Setting Pacing Amplitude: 2.5 V
Lead Channel Setting Pacing Pulse Width: 0.5 ms
Lead Channel Setting Sensing Sensitivity: 2 mV
Pulse Gen Model: 2272
Pulse Gen Serial Number: 9032820

## 2023-08-20 ENCOUNTER — Ambulatory Visit: Payer: Medicare Other | Admitting: Urology

## 2023-08-27 ENCOUNTER — Ambulatory Visit: Payer: Medicare Other | Admitting: Urology

## 2023-08-27 VITALS — BP 129/77 | HR 66

## 2023-08-27 DIAGNOSIS — Z87440 Personal history of urinary (tract) infections: Secondary | ICD-10-CM

## 2023-08-27 DIAGNOSIS — R300 Dysuria: Secondary | ICD-10-CM

## 2023-08-27 DIAGNOSIS — R350 Frequency of micturition: Secondary | ICD-10-CM

## 2023-08-27 DIAGNOSIS — N390 Urinary tract infection, site not specified: Secondary | ICD-10-CM

## 2023-08-27 LAB — URINALYSIS, ROUTINE W REFLEX MICROSCOPIC
Bilirubin, UA: NEGATIVE
Glucose, UA: NEGATIVE
Nitrite, UA: NEGATIVE
Protein,UA: NEGATIVE
Specific Gravity, UA: 1.03 (ref 1.005–1.030)
Urobilinogen, Ur: 0.2 mg/dL (ref 0.2–1.0)
pH, UA: 6 (ref 5.0–7.5)

## 2023-08-27 LAB — MICROSCOPIC EXAMINATION

## 2023-08-27 MED ORDER — NITROFURANTOIN MONOHYD MACRO 100 MG PO CAPS
100.0000 mg | ORAL_CAPSULE | Freq: Two times a day (BID) | ORAL | 0 refills | Status: DC
Start: 2023-08-27 — End: 2024-03-03

## 2023-08-27 MED ORDER — NITROFURANTOIN MACROCRYSTAL 50 MG PO CAPS
50.0000 mg | ORAL_CAPSULE | Freq: Every day | ORAL | 11 refills | Status: DC
Start: 2023-08-27 — End: 2024-03-03

## 2023-08-27 NOTE — Patient Instructions (Signed)

## 2023-08-27 NOTE — Progress Notes (Signed)
 08/27/2023 12:32 PM   [name] [name] 1923-10-02 [id number]  Referring provider: No referring provider defined for this encounter.  Recurrent UTI   HPI: [name] is a 89yo here for followup for recurrent UTI and dysuria. Starting 2-3 days ago she developed dysuria and worsening urinary frequency. NO hematuria. UA today is concerning for infection.    PMH: Past Medical History:  Diagnosis Date   Arthritis    Complication of anesthesia    pt states she had trouble waking up   Frequency of urination    Glaucoma    Osteoporosis    Pacemaker     Surgical History: Past Surgical History:  Procedure Laterality Date   BACK SURGERY  1967 / 1970'S   X 2   CHOLECYSTECTOMY  1980'S   EYE SURGERY     cataract and lasik for glaucoma   PACEMAKER IMPLANT N/A 10/23/2017   Procedure: PACEMAKER IMPLANT;  Surgeon: [name] Needle, MD;  [facility]: [facility];  Service: Cardiovascular;  Laterality: N/A;   TEMPORARY PACEMAKER N/A 10/23/2017   Procedure: TEMPORARY PACEMAKER;  Surgeon: [name] Crisp, MD;  Location: [facility];  Service: Cardiovascular;  Laterality: N/A;   TOTAL HIP ARTHROPLASTY Right 08/01/2013   Procedure: TOTAL RIGHT HIP ARTHROPLASTY ANTERIOR APPROACH;  Surgeon: [name] Bucks, MD;  Location: [facility];  Service: Orthopedics;  Laterality: Right;   TOTAL HIP ARTHROPLASTY Left 10/15/2015   Procedure: LEFT TOTAL HIP ARTHROPLASTY ANTERIOR APPROACH;  Surgeon: [name] Crew, MD;  Location: [facility];  Service: Orthopedics;  Laterality: Left;   WRIST FRACTURE SURGERY      Home Medications:  Allergies as of 08/27/2023       Reactions   Sulfonamide Derivatives Itching, Swelling   Alphagan [brimonidine] Itching, Rash   Cosopt [dorzolamide Hcl-timolol Mal] Itching, Swelling, Rash   Lumigan [bimatoprost] Itching, Rash   Pilocarpine Itching, Rash   Timolol Itching, Rash        Medication List        Accurate as of August 27, 2023 12:32 PM. If you have any questions,  ask your nurse or doctor.          methocarbamol  500 MG tablet Commonly known as: ROBAXIN  Take 1 tablet (500 mg total) by mouth 2 (two) times daily as needed for muscle spasms.        Allergies:  Allergies  Allergen Reactions   Sulfonamide Derivatives Itching and Swelling   Alphagan [Brimonidine] Itching and Rash   Cosopt [Dorzolamide Hcl-Timolol Mal] Itching, Swelling and Rash   Lumigan [Bimatoprost] Itching and Rash   Pilocarpine Itching and Rash   Timolol Itching and Rash    Family History: Family History  Problem Relation Age of Onset   Heart attack Father     Social History:  reports that she has never smoked. She has never used smokeless tobacco. She reports that she does not currently use alcohol. She reports that she does not use drugs.  ROS: All other review of systems were reviewed and are negative except what is noted above in HPI  Physical Exam: BP 129/77   Pulse 66   Constitutional:  Alert and oriented, No acute distress. HEENT: [place] AT, moist mucus membranes.  Trachea midline, no masses. Cardiovascular: No clubbing, cyanosis, or edema. Respiratory: Normal respiratory effort, no increased work of breathing. GI: Abdomen is soft, nontender, nondistended, no abdominal masses GU: No CVA tenderness.  Lymph: No cervical or inguinal lymphadenopathy. Skin: No rashes, bruises or suspicious lesions. Neurologic: Grossly  intact, no focal deficits, moving all 4 extremities. Psychiatric: Normal mood and affect.  Laboratory Data: Lab Results  Component Value Date   WBC 5.4 10/05/2022   HGB 13.4 10/05/2022   HCT 41.0 10/05/2022   MCV 95.3 10/05/2022   PLT 126 (L) 10/05/2022    Lab Results  Component Value Date   CREATININE 0.93 10/05/2022    No results found for: "PSA"  No results found for: "TESTOSTERONE"  No results found for: "HGBA1C"  Urinalysis    Component Value Date/Time   COLORURINE YELLOW 10/05/2022 1035   APPEARANCEUR Cloudy (A)  05/21/2023 1240   LABSPEC 1.020 10/05/2022 1035   PHURINE 5.0 10/05/2022 1035   GLUCOSEU Negative 05/21/2023 1240   HGBUR SMALL (A) 10/05/2022 1035   BILIRUBINUR Negative 05/21/2023 1240   KETONESUR NEGATIVE 10/05/2022 1035   PROTEINUR Negative 05/21/2023 1240   PROTEINUR NEGATIVE 10/05/2022 1035   UROBILINOGEN 0.2 07/24/2013 1124   NITRITE Negative 05/21/2023 1240   NITRITE NEGATIVE 10/05/2022 1035   LEUKOCYTESUR 2+ (A) 05/21/2023 1240   LEUKOCYTESUR LARGE (A) 10/05/2022 1035    Lab Results  Component Value Date   LABMICR See below: 05/21/2023   WBCUA 11-30 (A) 05/21/2023   LABEPIT 0-10 05/21/2023   BACTERIA Few 05/21/2023    Pertinent Imaging:  No results found for this or any previous visit.  No results found for this or any previous visit.  No results found for this or any previous visit.  No results found for this or any previous visit.  No results found for this or any previous visit.  No results found for this or any previous visit.  No results found for this or any previous visit.  Results for orders placed in visit on 05/17/23  CT RENAL STONE STUDY  Narrative CLINICAL DATA:  Abdominal/flank pain.  Evaluate for kidney stone.  EXAM: CT ABDOMEN AND PELVIS WITHOUT CONTRAST  TECHNIQUE: Multidetector CT imaging of the abdomen and pelvis was performed following the standard protocol without IV contrast.  RADIATION DOSE REDUCTION: This exam was performed according to the departmental dose-optimization program which includes automated exposure control, adjustment of the mA and/or kV according to patient size and/or use of iterative reconstruction technique.  COMPARISON:  05/26/2010  FINDINGS: Lower chest: No acute abnormality.  Hepatobiliary: No suspicious liver abnormality. Cholecystectomy. No significant bile duct dilatation.  Pancreas: No main duct dilatation, inflammation or mass. A few scattered parenchymal calcifications noted.  Spleen:  Normal in size without focal abnormality.  Adrenals/Urinary Tract: Normal adrenal glands. No nephrolithiasis, hydronephrosis or mass identified bilaterally. No stones identified along the course of the proximal and mid ureters. Distal ureters and urinary bladder are largely obscured by streak artifact from bilateral hip arthroplasty devices.  Stomach/Bowel: Stomach appears normal. There is no pathologic dilatation of the large or small bowel loops. Extensive diffuse colonic diverticulosis is identified throughout the colon, most numerous at the level of the sigmoid colon. No signs of acute diverticulitis. The appendix is visualized and appears normal.  Vascular/Lymphatic: Aortic atherosclerosis. No signs of abdominopelvic adenopathy.  Reproductive: Uterus and bilateral adnexa are unremarkable.  Other: No ascites or focal fluid collections. Fat containing umbilical hernia measures 6.8 x 3.2 cm. No signs of pneumoperitoneum.  Musculoskeletal: The bones are appear diffusely osteopenic. Curvature of the lumbar spine is convex towards the right. Multilevel lumbar degenerative disc disease. Status post bilateral hip arthroplasty.  IMPRESSION: 1. No acute findings within the abdomen or pelvis. 2. No evidence for nephrolithiasis or hydronephrosis. 3. Extensive  diffuse colonic diverticulosis without signs of acute diverticulitis. 4. Fat containing umbilical hernia. 5.  Aortic Atherosclerosis (ICD10-I70.0).   Electronically Signed By: Kimberley Penman M.D. On: 05/17/2023 17:03   Assessment & Plan:    1. Recurrent UTI (Primary) Urine for culture -macrobid  100mg  BID for 7 days and then macrodantin  50mg  QHS - Urinalysis, Routine w reflex microscopic   No follow-ups on file.  Johnie Nailer, MD  [facility] [place]

## 2023-08-29 LAB — URINE CULTURE

## 2023-08-31 NOTE — Progress Notes (Signed)
 Letter sent.

## 2023-09-01 NOTE — Addendum Note (Signed)
 Addended by: Lott Rouleau A on: 09/01/2023 11:43 AM   Modules accepted: Orders

## 2023-09-01 NOTE — Progress Notes (Signed)
 Remote pacemaker transmission.

## 2023-09-02 ENCOUNTER — Encounter: Payer: Self-pay | Admitting: Urology

## 2023-09-06 ENCOUNTER — Telehealth: Payer: Self-pay | Admitting: Urology

## 2023-09-06 NOTE — Telephone Encounter (Signed)
 Pt c/o medication issue:  1. Name of Medication: nitrofurantoin , macrocrystal-monohydrate, (MACROBID ) 100 MG   2. How are you currently taking this medication (dosage and times per day)? 1 capsule every 12 hrs   3. Are you having a reaction (difficulty breathing--STAT)? No   4. What is your medication issue? Patient is having numbness in her right hand from taking this medication

## 2023-09-06 NOTE — Telephone Encounter (Signed)
 Patient states that she completed abt today. Patient made aware her urine culture was negative. Patient states she is concerned about her bladder issues and would like something else done.

## 2023-09-07 NOTE — Telephone Encounter (Signed)
 Patient states she will go get low dose abt  and will pick up gemtesa samples up front.

## 2023-10-21 ENCOUNTER — Ambulatory Visit (INDEPENDENT_AMBULATORY_CARE_PROVIDER_SITE_OTHER): Payer: Medicare Other

## 2023-10-21 DIAGNOSIS — I442 Atrioventricular block, complete: Secondary | ICD-10-CM

## 2023-10-21 LAB — CUP PACEART REMOTE DEVICE CHECK
Battery Remaining Longevity: 62 mo
Battery Remaining Percentage: 52 %
Battery Voltage: 2.99 V
Brady Statistic AP VP Percent: 1 %
Brady Statistic AP VS Percent: 35 %
Brady Statistic AS VP Percent: 1 %
Brady Statistic AS VS Percent: 65 %
Brady Statistic RA Percent Paced: 34 %
Brady Statistic RV Percent Paced: 1 %
Date Time Interrogation Session: 20250619020013
Implantable Lead Connection Status: 753985
Implantable Lead Connection Status: 753985
Implantable Lead Implant Date: 20190622
Implantable Lead Implant Date: 20190622
Implantable Lead Location: 753859
Implantable Lead Location: 753860
Implantable Pulse Generator Implant Date: 20190622
Lead Channel Impedance Value: 380 Ohm
Lead Channel Impedance Value: 550 Ohm
Lead Channel Pacing Threshold Amplitude: 0.5 V
Lead Channel Pacing Threshold Amplitude: 1.25 V
Lead Channel Pacing Threshold Pulse Width: 0.5 ms
Lead Channel Pacing Threshold Pulse Width: 0.5 ms
Lead Channel Sensing Intrinsic Amplitude: 2.8 mV
Lead Channel Sensing Intrinsic Amplitude: 7.4 mV
Lead Channel Setting Pacing Amplitude: 2 V
Lead Channel Setting Pacing Amplitude: 2.5 V
Lead Channel Setting Pacing Pulse Width: 0.5 ms
Lead Channel Setting Sensing Sensitivity: 2 mV
Pulse Gen Model: 2272
Pulse Gen Serial Number: 9032820

## 2023-10-24 ENCOUNTER — Ambulatory Visit: Payer: Self-pay | Admitting: Internal Medicine

## 2023-12-03 ENCOUNTER — Encounter: Payer: Self-pay | Admitting: Urology

## 2023-12-03 ENCOUNTER — Ambulatory Visit: Admitting: Urology

## 2023-12-03 VITALS — BP 122/68 | HR 67

## 2023-12-03 DIAGNOSIS — N390 Urinary tract infection, site not specified: Secondary | ICD-10-CM

## 2023-12-03 DIAGNOSIS — Z87440 Personal history of urinary (tract) infections: Secondary | ICD-10-CM | POA: Diagnosis not present

## 2023-12-03 DIAGNOSIS — Z09 Encounter for follow-up examination after completed treatment for conditions other than malignant neoplasm: Secondary | ICD-10-CM | POA: Diagnosis not present

## 2023-12-03 LAB — MICROSCOPIC EXAMINATION: Epithelial Cells (non renal): >10 /HPF — ABNORMAL HIGH (ref 0–10)

## 2023-12-03 LAB — URINALYSIS, ROUTINE W REFLEX MICROSCOPIC
Bilirubin, UA: NEGATIVE
Glucose, UA: NEGATIVE
Ketones, UA: NEGATIVE
Nitrite, UA: NEGATIVE
Protein,UA: NEGATIVE
Specific Gravity, UA: 1.025 (ref 1.005–1.030)
Urobilinogen, Ur: 0.2 mg/dL (ref 0.2–1.0)
pH, UA: 6 (ref 5.0–7.5)

## 2023-12-03 NOTE — Progress Notes (Signed)
 12/03/2023 11:26 AM   [name] [name] 1923-07-15 [id number]  Referring provider: No referring provider defined for this encounter.  Frequent UTI   HPI: [name] [name] is a 89yo here for followup for frequent UTI. No UTIs since last visit. She stopped macrodantin  and is currently on a cranberry suppliment. No dysuria or hematuria. No other complaints today.    PMH: Past Medical History:  Diagnosis Date   Arthritis    Complication of anesthesia    pt states she had trouble waking up   Frequency of urination    Glaucoma    Osteoporosis    Pacemaker     Surgical History: Past Surgical History:  Procedure Laterality Date   BACK SURGERY  1967 / 1970'S   X 2   CHOLECYSTECTOMY  1980'S   EYE SURGERY     cataract and lasik for glaucoma   PACEMAKER IMPLANT N/A 10/23/2017   Procedure: PACEMAKER IMPLANT;  Surgeon: Kelsie Agent, MD;  Location: [facility];  Service: Cardiovascular;  Laterality: N/A;   TEMPORARY PACEMAKER N/A 10/23/2017   Procedure: TEMPORARY PACEMAKER;  Surgeon: Mady Bruckner, MD;  Location: [facility];  Service: Cardiovascular;  Laterality: N/A;   TOTAL HIP ARTHROPLASTY Right 08/01/2013   Procedure: TOTAL RIGHT HIP ARTHROPLASTY ANTERIOR APPROACH;  Surgeon: Donnice JONETTA Car, MD;  Location: [facility];  Service: Orthopedics;  Laterality: Right;   TOTAL HIP ARTHROPLASTY Left 10/15/2015   Procedure: LEFT TOTAL HIP ARTHROPLASTY ANTERIOR APPROACH;  Surgeon: Donnice Car, MD;  Location: [facility];  Service: Orthopedics;  Laterality: Left;   WRIST FRACTURE SURGERY      Home Medications:  Allergies as of 12/03/2023       Reactions   Sulfonamide Derivatives Itching, Swelling   Alphagan [brimonidine] Itching, Rash   Cosopt [dorzolamide Hcl-timolol Mal] Itching, Swelling, Rash   Lumigan [bimatoprost] Itching, Rash   Pilocarpine Itching, Rash   Timolol Itching, Rash        Medication List        Accurate as of December 03, 2023 11:26 AM. If you have any questions,  ask your nurse or doctor.          methocarbamol  500 MG tablet Commonly known as: ROBAXIN  Take 1 tablet (500 mg total) by mouth 2 (two) times daily as needed for muscle spasms.   nitrofurantoin  (macrocrystal-monohydrate) 100 MG capsule Commonly known as: MACROBID  Take 1 capsule (100 mg total) by mouth every 12 (twelve) hours.   nitrofurantoin  50 MG capsule Commonly known as: Macrodantin  Take 1 capsule (50 mg total) by mouth at bedtime.        Allergies:  Allergies  Allergen Reactions   Sulfonamide Derivatives Itching and Swelling   Alphagan [Brimonidine] Itching and Rash   Cosopt [Dorzolamide Hcl-Timolol Mal] Itching, Swelling and Rash   Lumigan [Bimatoprost] Itching and Rash   Pilocarpine Itching and Rash   Timolol Itching and Rash    Family History: Family History  Problem Relation Age of Onset   Heart attack Father     Social History:  reports that she has never smoked. She has never used smokeless tobacco. She reports that she does not currently use alcohol. She reports that she does not use drugs.  ROS: All other review of systems were reviewed and are negative except what is noted above in HPI  Physical Exam: BP 122/68   Pulse 67   Constitutional:  Alert and oriented, No acute distress. HEENT: [place] AT, moist mucus membranes.  Trachea midline, no  masses. Cardiovascular: No clubbing, cyanosis, or edema. Respiratory: Normal respiratory effort, no increased work of breathing. GI: Abdomen is soft, nontender, nondistended, no abdominal masses GU: No CVA tenderness.  Lymph: No cervical or inguinal lymphadenopathy. Skin: No rashes, bruises or suspicious lesions. Neurologic: Grossly intact, no focal deficits, moving all 4 extremities. Psychiatric: Normal mood and affect.  Laboratory Data: Lab Results  Component Value Date   WBC 5.4 10/05/2022   HGB 13.4 10/05/2022   HCT 41.0 10/05/2022   MCV 95.3 10/05/2022   PLT 126 (L) 10/05/2022    Lab Results   Component Value Date   CREATININE 0.93 10/05/2022    No results found for: PSA  No results found for: TESTOSTERONE  No results found for: HGBA1C  Urinalysis    Component Value Date/Time   COLORURINE YELLOW 10/05/2022 1035   APPEARANCEUR Clear 08/27/2023 1234   LABSPEC 1.020 10/05/2022 1035   PHURINE 5.0 10/05/2022 1035   GLUCOSEU Negative 08/27/2023 1234   HGBUR SMALL (A) 10/05/2022 1035   BILIRUBINUR Negative 08/27/2023 1234   KETONESUR NEGATIVE 10/05/2022 1035   PROTEINUR Negative 08/27/2023 1234   PROTEINUR NEGATIVE 10/05/2022 1035   UROBILINOGEN 0.2 07/24/2013 1124   NITRITE Negative 08/27/2023 1234   NITRITE NEGATIVE 10/05/2022 1035   LEUKOCYTESUR 2+ (A) 08/27/2023 1234   LEUKOCYTESUR LARGE (A) 10/05/2022 1035    Lab Results  Component Value Date   LABMICR See below: 08/27/2023   WBCUA 6-10 (A) 08/27/2023   LABEPIT 0-10 08/27/2023   BACTERIA Few 08/27/2023    Pertinent Imaging:  No results found for this or any previous visit.  No results found for this or any previous visit.  No results found for this or any previous visit.  No results found for this or any previous visit.  No results found for this or any previous visit.  No results found for this or any previous visit.  No results found for this or any previous visit.  Results for orders placed in visit on 05/17/23  CT RENAL STONE STUDY  Narrative CLINICAL DATA:  Abdominal/flank pain.  Evaluate for kidney stone.  EXAM: CT ABDOMEN AND PELVIS WITHOUT CONTRAST  TECHNIQUE: Multidetector CT imaging of the abdomen and pelvis was performed following the standard protocol without IV contrast.  RADIATION DOSE REDUCTION: This exam was performed according to the departmental dose-optimization program which includes automated exposure control, adjustment of the mA and/or kV according to patient size and/or use of iterative reconstruction technique.  COMPARISON:   05/26/2010  FINDINGS: Lower chest: No acute abnormality.  Hepatobiliary: No suspicious liver abnormality. Cholecystectomy. No significant bile duct dilatation.  Pancreas: No main duct dilatation, inflammation or mass. A few scattered parenchymal calcifications noted.  Spleen: Normal in size without focal abnormality.  Adrenals/Urinary Tract: Normal adrenal glands. No nephrolithiasis, hydronephrosis or mass identified bilaterally. No stones identified along the course of the proximal and mid ureters. Distal ureters and urinary bladder are largely obscured by streak artifact from bilateral hip arthroplasty devices.  Stomach/Bowel: Stomach appears normal. There is no pathologic dilatation of the large or small bowel loops. Extensive diffuse colonic diverticulosis is identified throughout the colon, most numerous at the level of the sigmoid colon. No signs of acute diverticulitis. The appendix is visualized and appears normal.  Vascular/Lymphatic: Aortic atherosclerosis. No signs of abdominopelvic adenopathy.  Reproductive: Uterus and bilateral adnexa are unremarkable.  Other: No ascites or focal fluid collections. Fat containing umbilical hernia measures 6.8 x 3.2 cm. No signs of pneumoperitoneum.  Musculoskeletal: The bones  are appear diffusely osteopenic. Curvature of the lumbar spine is convex towards the right. Multilevel lumbar degenerative disc disease. Status post bilateral hip arthroplasty.  IMPRESSION: 1. No acute findings within the abdomen or pelvis. 2. No evidence for nephrolithiasis or hydronephrosis. 3. Extensive diffuse colonic diverticulosis without signs of acute diverticulitis. 4. Fat containing umbilical hernia. 5.  Aortic Atherosclerosis (ICD10-I70.0).   Electronically Signed By: Waddell Calk M.D. On: 05/17/2023 17:03   Assessment & Plan:    1. Recurrent UTI (Primary) Continue cranberry supplement -D-mannose supplement -followup 6 months   - Urinalysis, Routine w reflex microscopic   No follow-ups on file.  Belvie Clara, MD  [facility] [place]

## 2023-12-03 NOTE — Patient Instructions (Signed)

## 2023-12-27 NOTE — Progress Notes (Signed)
 Remote pacemaker transmission.

## 2024-01-20 ENCOUNTER — Ambulatory Visit (INDEPENDENT_AMBULATORY_CARE_PROVIDER_SITE_OTHER)

## 2024-01-20 DIAGNOSIS — I442 Atrioventricular block, complete: Secondary | ICD-10-CM

## 2024-01-20 LAB — CUP PACEART REMOTE DEVICE CHECK
Battery Remaining Longevity: 58 mo
Battery Remaining Percentage: 50 %
Battery Voltage: 2.99 V
Brady Statistic AP VP Percent: 1 %
Brady Statistic AP VS Percent: 34 %
Brady Statistic AS VP Percent: 1 %
Brady Statistic AS VS Percent: 66 %
Brady Statistic RA Percent Paced: 33 %
Brady Statistic RV Percent Paced: 1 %
Date Time Interrogation Session: 20250918020015
Implantable Lead Connection Status: 753985
Implantable Lead Connection Status: 753985
Implantable Lead Implant Date: 20190622
Implantable Lead Implant Date: 20190622
Implantable Lead Location: 753859
Implantable Lead Location: 753860
Implantable Pulse Generator Implant Date: 20190622
Lead Channel Impedance Value: 350 Ohm
Lead Channel Impedance Value: 430 Ohm
Lead Channel Pacing Threshold Amplitude: 0.5 V
Lead Channel Pacing Threshold Amplitude: 1.25 V
Lead Channel Pacing Threshold Pulse Width: 0.5 ms
Lead Channel Pacing Threshold Pulse Width: 0.5 ms
Lead Channel Sensing Intrinsic Amplitude: 2.2 mV
Lead Channel Sensing Intrinsic Amplitude: 6.5 mV
Lead Channel Setting Pacing Amplitude: 2 V
Lead Channel Setting Pacing Amplitude: 2.5 V
Lead Channel Setting Pacing Pulse Width: 0.5 ms
Lead Channel Setting Sensing Sensitivity: 2 mV
Pulse Gen Model: 2272
Pulse Gen Serial Number: 9032820

## 2024-01-25 NOTE — Progress Notes (Signed)
 Remote PPM Transmission

## 2024-01-29 ENCOUNTER — Ambulatory Visit: Payer: Self-pay | Admitting: Internal Medicine

## 2024-03-03 ENCOUNTER — Ambulatory Visit: Attending: Internal Medicine | Admitting: Internal Medicine

## 2024-03-03 ENCOUNTER — Encounter: Payer: Self-pay | Admitting: Internal Medicine

## 2024-03-03 VITALS — BP 118/60 | HR 70 | Ht 65.0 in | Wt 113.8 lb

## 2024-03-03 DIAGNOSIS — I442 Atrioventricular block, complete: Secondary | ICD-10-CM | POA: Diagnosis not present

## 2024-03-03 LAB — CUP PACEART INCLINIC DEVICE CHECK
Date Time Interrogation Session: 20251031124141
Implantable Lead Connection Status: 753985
Implantable Lead Connection Status: 753985
Implantable Lead Implant Date: 20190622
Implantable Lead Implant Date: 20190622
Implantable Lead Location: 753859
Implantable Lead Location: 753860
Implantable Pulse Generator Implant Date: 20190622
Pulse Gen Model: 2272
Pulse Gen Serial Number: 9032820

## 2024-03-03 NOTE — Progress Notes (Signed)
 HPI [name] [name] returns today for followup. 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 She has done well in the interim. She still tends a garden.  No chest pain or sob. No syncope.No vision difficulties despite having glaucoma and not being on any meds. She is working regularly in her garden. No other complaints.  Allergies  Allergen Reactions   Sulfonamide Derivatives Itching and Swelling   Alphagan [Brimonidine] Itching and Rash   Cosopt [Dorzolamide Hcl-Timolol Mal] Itching, Swelling and Rash   Lumigan [Bimatoprost] Itching and Rash   Pilocarpine Itching and Rash   Timolol Itching and Rash     No current outpatient medications on file.   No current facility-administered medications for this visit.     Past Medical History:  Diagnosis Date   Arthritis    Complication of anesthesia    pt states she had trouble waking up   Frequency of urination    Glaucoma    Osteoporosis    Pacemaker     ROS:   All systems reviewed and negative except as noted in the HPI.   Past Surgical History:  Procedure Laterality Date   BACK SURGERY  1967 / 1970'S   X 2   CHOLECYSTECTOMY  1980'S   EYE SURGERY     cataract and lasik for glaucoma   PACEMAKER IMPLANT N/A 10/23/2017   Procedure: PACEMAKER IMPLANT;  Surgeon: Kelsie Agent, MD;  Location: [facility];  Service: Cardiovascular;  Laterality: N/A;   TEMPORARY PACEMAKER N/A 10/23/2017   Procedure: TEMPORARY PACEMAKER;  Surgeon: Mady Bruckner, MD;  Location: [facility];  Service: Cardiovascular;  Laterality: N/A;   TOTAL HIP ARTHROPLASTY Right 08/01/2013   Procedure: TOTAL RIGHT HIP ARTHROPLASTY ANTERIOR APPROACH;  Surgeon: Donnice JONETTA Car, MD;  Location: [facility];  Service: Orthopedics;  Laterality: Right;   TOTAL HIP ARTHROPLASTY Left 10/15/2015   Procedure: LEFT TOTAL HIP ARTHROPLASTY ANTERIOR APPROACH;  Surgeon: Donnice Car, MD;  Location:  [facility];  Service: Orthopedics;  Laterality: Left;   WRIST FRACTURE SURGERY       Family History  Problem Relation Age of Onset   Heart attack Father      Social History   Socioeconomic History   Marital status: Married    Spouse name: Not on file   Number of children: Not on file   Years of education: Not on file   Highest education level: Not on file  Occupational History   Not on file  Tobacco Use   Smoking status: Never   Smokeless tobacco: Never  Vaping Use   Vaping status: Never Used  Substance and Sexual Activity   Alcohol use: Not Currently    Comment: OCCASIONAL WINE   Drug use: No   Sexual activity: Not on file  Other Topics Concern   Not on file  Social History Narrative   Not on file   Social Drivers of Health   Financial Resource Strain: Not on file  Food Insecurity: Not on file  Transportation Needs: Not on file  Physical Activity: Not on file  Stress: Not on file  Social Connections: Not on file  Intimate Partner Violence: Not on file     BP 118/60   Pulse 70   Ht 5' 5 (1.651 m)   Wt 113 lb 12.8 oz (51.6 kg)   SpO2 94%   BMI 18.94 kg/m   Physical  Exam:  Well appearing NAD HEENT: Unremarkable Neck:  No JVD, no thyromegally Lymphatics:  No adenopathy Back:  No CVA tenderness Lungs:  Clear HEART:  Regular rate rhythm, no murmurs, no rubs, no clicks Abd:  soft, positive bowel sounds, no organomegally, no rebound, no guarding Ext:  2 plus pulses, no edema, no cyanosis, no clubbing Skin:  No rashes no nodules Neuro:  CN II through XII intact, motor grossly intact  EKG - NSR with atrial pacing  DEVICE  Normal device function.  See PaceArt for details.   Assess/Plan: 1. Sinus node dysfunction - she is asymptomatic, s/p PPM insertion. She is pacing 33% 2. PPM -her [employer] DDD PM is working normally. We will recheck in several months   Danelle Dorothey Oetken,MD

## 2024-03-03 NOTE — Patient Instructions (Signed)

## 2024-04-20 ENCOUNTER — Ambulatory Visit

## 2024-04-20 DIAGNOSIS — I442 Atrioventricular block, complete: Secondary | ICD-10-CM

## 2024-04-20 LAB — CUP PACEART REMOTE DEVICE CHECK
Battery Remaining Longevity: 56 mo
Battery Remaining Percentage: 48 %
Battery Voltage: 2.99 V
Brady Statistic AP VP Percent: 1 %
Brady Statistic AP VS Percent: 31 %
Brady Statistic AS VP Percent: 1 %
Brady Statistic AS VS Percent: 69 %
Brady Statistic RA Percent Paced: 31 %
Brady Statistic RV Percent Paced: 1 %
Date Time Interrogation Session: 20251218020014
Implantable Lead Connection Status: 753985
Implantable Lead Connection Status: 753985
Implantable Lead Implant Date: 20190622
Implantable Lead Implant Date: 20190622
Implantable Lead Location: 753859
Implantable Lead Location: 753860
Implantable Pulse Generator Implant Date: 20190622
Lead Channel Impedance Value: 380 Ohm
Lead Channel Impedance Value: 530 Ohm
Lead Channel Pacing Threshold Amplitude: 0.5 V
Lead Channel Pacing Threshold Amplitude: 1.25 V
Lead Channel Pacing Threshold Pulse Width: 0.5 ms
Lead Channel Pacing Threshold Pulse Width: 0.5 ms
Lead Channel Sensing Intrinsic Amplitude: 2.7 mV
Lead Channel Sensing Intrinsic Amplitude: 6.6 mV
Lead Channel Setting Pacing Amplitude: 2 V
Lead Channel Setting Pacing Amplitude: 2.5 V
Lead Channel Setting Pacing Pulse Width: 0.5 ms
Lead Channel Setting Sensing Sensitivity: 2 mV
Pulse Gen Model: 2272
Pulse Gen Serial Number: 9032820

## 2024-04-23 NOTE — Progress Notes (Signed)
 Remote PPM Transmission

## 2024-04-28 ENCOUNTER — Ambulatory Visit: Payer: Self-pay | Admitting: Student in an Organized Health Care Education/Training Program

## 2024-06-09 ENCOUNTER — Ambulatory Visit: Admitting: Urology

## 2024-07-20 ENCOUNTER — Encounter

## 2024-10-19 ENCOUNTER — Encounter

## 2024-11-10 ENCOUNTER — Ambulatory Visit: Admitting: Urology

## 2025-01-18 ENCOUNTER — Encounter
